# Patient Record
Sex: Male | Born: 1950
Health system: Southern US, Community
[De-identification: ages and names within clinical notes are randomized; demographics above are authoritative.]

## PROBLEM LIST (undated history)

## (undated) DIAGNOSIS — R51 Headache: Secondary | ICD-10-CM

## (undated) DIAGNOSIS — M1990 Unspecified osteoarthritis, unspecified site: Secondary | ICD-10-CM

## (undated) DIAGNOSIS — I2510 Atherosclerotic heart disease of native coronary artery without angina pectoris: Secondary | ICD-10-CM

## (undated) DIAGNOSIS — R519 Headache, unspecified: Secondary | ICD-10-CM

## (undated) DIAGNOSIS — C801 Malignant (primary) neoplasm, unspecified: Secondary | ICD-10-CM

## (undated) DIAGNOSIS — R42 Dizziness and giddiness: Secondary | ICD-10-CM

## (undated) DIAGNOSIS — I739 Peripheral vascular disease, unspecified: Secondary | ICD-10-CM

## (undated) DIAGNOSIS — R202 Paresthesia of skin: Secondary | ICD-10-CM

## (undated) DIAGNOSIS — H9319 Tinnitus, unspecified ear: Secondary | ICD-10-CM

## (undated) DIAGNOSIS — E785 Hyperlipidemia, unspecified: Secondary | ICD-10-CM

## (undated) DIAGNOSIS — H9190 Unspecified hearing loss, unspecified ear: Secondary | ICD-10-CM

## (undated) DIAGNOSIS — Z8719 Personal history of other diseases of the digestive system: Secondary | ICD-10-CM

## (undated) DIAGNOSIS — R7302 Impaired glucose tolerance (oral): Secondary | ICD-10-CM

## (undated) DIAGNOSIS — R200 Anesthesia of skin: Secondary | ICD-10-CM

## (undated) DIAGNOSIS — IMO0001 Reserved for inherently not codable concepts without codable children: Secondary | ICD-10-CM

## (undated) DIAGNOSIS — S46219A Strain of muscle, fascia and tendon of other parts of biceps, unspecified arm, initial encounter: Secondary | ICD-10-CM

## (undated) DIAGNOSIS — Z8709 Personal history of other diseases of the respiratory system: Secondary | ICD-10-CM

## (undated) HISTORY — PX: OTHER SURGICAL HISTORY: SHX169

## (undated) HISTORY — DX: Impaired glucose tolerance (oral): R73.02

## (undated) HISTORY — DX: Unspecified osteoarthritis, unspecified site: M19.90

## (undated) HISTORY — PX: HERNIA REPAIR: SHX51

## (undated) HISTORY — DX: Strain of muscle, fascia and tendon of other parts of biceps, unspecified arm, initial encounter: S46.219A

## (undated) HISTORY — DX: Atherosclerotic heart disease of native coronary artery without angina pectoris: I25.10

## (undated) HISTORY — DX: Peripheral vascular disease, unspecified: I73.9

## (undated) HISTORY — DX: Hyperlipidemia, unspecified: E78.5

## (undated) HISTORY — PX: PR VEIN BYPASS GRAFT,AORTO-FEM-POP: 35551

---

## 2000-03-31 ENCOUNTER — Encounter (INDEPENDENT_AMBULATORY_CARE_PROVIDER_SITE_OTHER): Payer: Self-pay | Admitting: *Deleted

## 2000-03-31 ENCOUNTER — Ambulatory Visit (HOSPITAL_BASED_OUTPATIENT_CLINIC_OR_DEPARTMENT_OTHER): Admission: RE | Admit: 2000-03-31 | Discharge: 2000-03-31 | Payer: Self-pay | Admitting: Plastic Surgery

## 2000-04-13 ENCOUNTER — Ambulatory Visit (HOSPITAL_BASED_OUTPATIENT_CLINIC_OR_DEPARTMENT_OTHER): Admission: RE | Admit: 2000-04-13 | Discharge: 2000-04-13 | Payer: Self-pay | Admitting: Plastic Surgery

## 2000-05-18 ENCOUNTER — Ambulatory Visit (HOSPITAL_BASED_OUTPATIENT_CLINIC_OR_DEPARTMENT_OTHER): Admission: RE | Admit: 2000-05-18 | Discharge: 2000-05-18 | Payer: Self-pay | Admitting: Plastic Surgery

## 2000-06-23 ENCOUNTER — Encounter (INDEPENDENT_AMBULATORY_CARE_PROVIDER_SITE_OTHER): Payer: Self-pay | Admitting: *Deleted

## 2000-06-23 ENCOUNTER — Ambulatory Visit (HOSPITAL_BASED_OUTPATIENT_CLINIC_OR_DEPARTMENT_OTHER): Admission: RE | Admit: 2000-06-23 | Discharge: 2000-06-23 | Payer: Self-pay | Admitting: Plastic Surgery

## 2002-09-15 ENCOUNTER — Encounter: Payer: Self-pay | Admitting: Emergency Medicine

## 2002-09-15 ENCOUNTER — Emergency Department (HOSPITAL_COMMUNITY): Admission: EM | Admit: 2002-09-15 | Discharge: 2002-09-15 | Payer: Self-pay | Admitting: Emergency Medicine

## 2002-10-03 ENCOUNTER — Ambulatory Visit (HOSPITAL_BASED_OUTPATIENT_CLINIC_OR_DEPARTMENT_OTHER): Admission: RE | Admit: 2002-10-03 | Discharge: 2002-10-03 | Payer: Self-pay | Admitting: Orthopedic Surgery

## 2003-11-30 DIAGNOSIS — I739 Peripheral vascular disease, unspecified: Secondary | ICD-10-CM

## 2003-11-30 HISTORY — PX: OTHER SURGICAL HISTORY: SHX169

## 2003-11-30 HISTORY — DX: Peripheral vascular disease, unspecified: I73.9

## 2004-08-20 ENCOUNTER — Ambulatory Visit (HOSPITAL_COMMUNITY): Admission: RE | Admit: 2004-08-20 | Discharge: 2004-08-20 | Payer: Self-pay | Admitting: Internal Medicine

## 2004-10-01 ENCOUNTER — Ambulatory Visit (HOSPITAL_COMMUNITY): Admission: RE | Admit: 2004-10-01 | Discharge: 2004-10-01 | Payer: Self-pay | Admitting: Vascular Surgery

## 2004-10-09 ENCOUNTER — Inpatient Hospital Stay (HOSPITAL_COMMUNITY): Admission: RE | Admit: 2004-10-09 | Discharge: 2004-10-12 | Payer: Self-pay | Admitting: Vascular Surgery

## 2005-01-06 ENCOUNTER — Encounter: Admission: RE | Admit: 2005-01-06 | Discharge: 2005-01-06 | Payer: Self-pay | Admitting: Internal Medicine

## 2008-04-25 ENCOUNTER — Ambulatory Visit: Payer: Self-pay | Admitting: Vascular Surgery

## 2008-05-29 ENCOUNTER — Ambulatory Visit: Payer: Self-pay | Admitting: Vascular Surgery

## 2008-07-05 ENCOUNTER — Encounter: Admission: RE | Admit: 2008-07-05 | Discharge: 2008-07-05 | Payer: Self-pay | Admitting: Internal Medicine

## 2008-08-26 ENCOUNTER — Ambulatory Visit: Payer: Self-pay | Admitting: Vascular Surgery

## 2008-09-06 ENCOUNTER — Ambulatory Visit: Payer: Self-pay | Admitting: Internal Medicine

## 2008-09-06 DIAGNOSIS — I739 Peripheral vascular disease, unspecified: Secondary | ICD-10-CM

## 2008-09-06 DIAGNOSIS — I70219 Atherosclerosis of native arteries of extremities with intermittent claudication, unspecified extremity: Secondary | ICD-10-CM | POA: Insufficient documentation

## 2010-08-21 ENCOUNTER — Ambulatory Visit: Payer: Self-pay | Admitting: Internal Medicine

## 2010-08-21 LAB — CONVERTED CEMR LAB
ALT: 15 units/L (ref 0–53)
AST: 20 units/L (ref 0–37)
Albumin: 4.1 g/dL (ref 3.5–5.2)
Alkaline Phosphatase: 61 units/L (ref 39–117)
BUN: 18 mg/dL (ref 6–23)
Basophils Absolute: 0 10*3/uL (ref 0.0–0.1)
Basophils Relative: 1 % (ref 0.0–3.0)
Bilirubin Urine: NEGATIVE
Bilirubin, Direct: 0.1 mg/dL (ref 0.0–0.3)
CO2: 29 meq/L (ref 19–32)
Calcium: 8.7 mg/dL (ref 8.4–10.5)
Chloride: 101 meq/L (ref 96–112)
Cholesterol: 171 mg/dL (ref 0–200)
Creatinine, Ser: 0.9 mg/dL (ref 0.4–1.5)
Eosinophils Absolute: 0.1 10*3/uL (ref 0.0–0.7)
Eosinophils Relative: 1.5 % (ref 0.0–5.0)
GFR calc non Af Amer: 86.66 mL/min (ref >60)
Glucose, Bld: 83 mg/dL (ref 70–99)
HCT: 45.5 % (ref 39.0–52.0)
HDL: 46.1 mg/dL (ref >39.00)
Hemoglobin, Urine: NEGATIVE
Hemoglobin: 15.6 g/dL (ref 13.0–17.0)
Ketones, ur: NEGATIVE mg/dL
LDL Cholesterol: 118 mg/dL — ABNORMAL HIGH (ref 0–99)
Leukocytes, UA: NEGATIVE
Lymphocytes Relative: 32.6 % (ref 12.0–46.0)
Lymphs Abs: 1.7 10*3/uL (ref 0.7–4.0)
MCHC: 34.4 g/dL (ref 30.0–36.0)
MCV: 95.6 fL (ref 78.0–100.0)
Monocytes Absolute: 0.5 10*3/uL (ref 0.1–1.0)
Monocytes Relative: 9.5 % (ref 3.0–12.0)
Neutro Abs: 2.8 10*3/uL (ref 1.4–7.7)
Neutrophils Relative %: 55.4 % (ref 43.0–77.0)
Nitrite: NEGATIVE
PSA: 0.57 ng/mL (ref 0.10–4.00)
Platelets: 234 10*3/uL (ref 150.0–400.0)
Potassium: 4.3 meq/L (ref 3.5–5.1)
RBC: 4.76 M/uL (ref 4.22–5.81)
RDW: 13.7 % (ref 11.5–14.6)
Sodium: 138 meq/L (ref 135–145)
Specific Gravity, Urine: 1.02 (ref 1.000–1.030)
TSH: 2.18 microintl units/mL (ref 0.35–5.50)
Total Bilirubin: 0.8 mg/dL (ref 0.3–1.2)
Total CHOL/HDL Ratio: 4
Total Protein, Urine: NEGATIVE mg/dL
Total Protein: 6.6 g/dL (ref 6.0–8.3)
Triglycerides: 33 mg/dL (ref 0.0–149.0)
Urine Glucose: NEGATIVE mg/dL
Urobilinogen, UA: 0.2 (ref 0.0–1.0)
VLDL: 6.6 mg/dL (ref 0.0–40.0)
WBC: 5.1 10*3/uL (ref 4.5–10.5)
pH: 6.5 (ref 5.0–8.0)

## 2010-08-27 ENCOUNTER — Encounter: Payer: Self-pay | Admitting: Internal Medicine

## 2010-08-27 ENCOUNTER — Ambulatory Visit: Payer: Self-pay | Admitting: Internal Medicine

## 2010-08-27 DIAGNOSIS — E785 Hyperlipidemia, unspecified: Secondary | ICD-10-CM | POA: Insufficient documentation

## 2010-08-27 DIAGNOSIS — M19049 Primary osteoarthritis, unspecified hand: Secondary | ICD-10-CM | POA: Insufficient documentation

## 2010-08-27 DIAGNOSIS — R079 Chest pain, unspecified: Secondary | ICD-10-CM | POA: Insufficient documentation

## 2010-08-31 ENCOUNTER — Encounter (INDEPENDENT_AMBULATORY_CARE_PROVIDER_SITE_OTHER): Payer: Self-pay | Admitting: *Deleted

## 2010-09-01 ENCOUNTER — Encounter (INDEPENDENT_AMBULATORY_CARE_PROVIDER_SITE_OTHER): Payer: Self-pay | Admitting: *Deleted

## 2010-09-02 ENCOUNTER — Telehealth (INDEPENDENT_AMBULATORY_CARE_PROVIDER_SITE_OTHER): Payer: Self-pay

## 2010-09-03 ENCOUNTER — Ambulatory Visit: Payer: Self-pay

## 2010-09-03 ENCOUNTER — Encounter (HOSPITAL_COMMUNITY)
Admission: RE | Admit: 2010-09-03 | Discharge: 2010-09-14 | Payer: Self-pay | Source: Home / Self Care | Admitting: Internal Medicine

## 2010-09-03 ENCOUNTER — Ambulatory Visit: Payer: Self-pay | Admitting: Gastroenterology

## 2010-09-03 ENCOUNTER — Encounter: Payer: Self-pay | Admitting: Internal Medicine

## 2010-09-03 ENCOUNTER — Ambulatory Visit: Payer: Self-pay | Admitting: Internal Medicine

## 2010-09-04 ENCOUNTER — Encounter: Payer: Self-pay | Admitting: Internal Medicine

## 2010-09-04 DIAGNOSIS — R0609 Other forms of dyspnea: Secondary | ICD-10-CM | POA: Insufficient documentation

## 2010-09-04 DIAGNOSIS — R0989 Other specified symptoms and signs involving the circulatory and respiratory systems: Secondary | ICD-10-CM

## 2010-09-04 DIAGNOSIS — R9431 Abnormal electrocardiogram [ECG] [EKG]: Secondary | ICD-10-CM | POA: Insufficient documentation

## 2010-09-07 ENCOUNTER — Ambulatory Visit: Payer: Self-pay | Admitting: Gastroenterology

## 2010-09-07 LAB — HM COLONOSCOPY

## 2010-09-09 ENCOUNTER — Encounter: Payer: Self-pay | Admitting: Gastroenterology

## 2010-09-15 ENCOUNTER — Ambulatory Visit: Payer: Self-pay | Admitting: Cardiology

## 2010-09-16 ENCOUNTER — Encounter: Payer: Self-pay | Admitting: Cardiology

## 2010-09-17 ENCOUNTER — Telehealth: Payer: Self-pay | Admitting: Internal Medicine

## 2010-09-17 ENCOUNTER — Telehealth: Payer: Self-pay | Admitting: Cardiology

## 2010-09-23 ENCOUNTER — Telehealth: Payer: Self-pay | Admitting: Cardiology

## 2010-09-25 ENCOUNTER — Ambulatory Visit: Payer: Self-pay | Admitting: Cardiology

## 2010-09-25 ENCOUNTER — Inpatient Hospital Stay (HOSPITAL_BASED_OUTPATIENT_CLINIC_OR_DEPARTMENT_OTHER): Admission: RE | Admit: 2010-09-25 | Discharge: 2010-09-25 | Payer: Self-pay | Admitting: Cardiology

## 2010-10-01 ENCOUNTER — Ambulatory Visit: Payer: Self-pay | Admitting: Cardiology

## 2010-10-01 DIAGNOSIS — I2510 Atherosclerotic heart disease of native coronary artery without angina pectoris: Secondary | ICD-10-CM | POA: Insufficient documentation

## 2010-10-12 ENCOUNTER — Ambulatory Visit: Payer: Self-pay | Admitting: Cardiology

## 2010-10-13 ENCOUNTER — Ambulatory Visit: Payer: Self-pay | Admitting: Vascular Surgery

## 2010-10-13 LAB — CONVERTED CEMR LAB
BUN: 16 mg/dL (ref 6–23)
CO2: 30 meq/L (ref 19–32)
Calcium: 8.9 mg/dL (ref 8.4–10.5)
Chloride: 101 meq/L (ref 96–112)
Creatinine, Ser: 1 mg/dL (ref 0.4–1.5)
GFR calc non Af Amer: 78.83 mL/min (ref >60)
Glucose, Bld: 169 mg/dL — ABNORMAL HIGH (ref 70–99)
Potassium: 4.4 meq/L (ref 3.5–5.1)
Sodium: 137 meq/L (ref 135–145)

## 2010-12-07 ENCOUNTER — Ambulatory Visit: Admit: 2010-12-07 | Payer: Self-pay | Admitting: Cardiology

## 2010-12-27 LAB — CONVERTED CEMR LAB
ALT: 21 units/L (ref 0–53)
AST: 28 units/L (ref 0–37)
Albumin: 4 g/dL (ref 3.5–5.2)
Alkaline Phosphatase: 75 units/L (ref 39–117)
BUN: 17 mg/dL (ref 6–23)
BUN: 18 mg/dL (ref 6–23)
Basophils Absolute: 0.1 10*3/uL (ref 0.0–0.1)
Basophils Absolute: 0.1 10*3/uL (ref 0.0–0.1)
Basophils Relative: 0.9 % (ref 0.0–3.0)
Basophils Relative: 1 % (ref 0.0–3.0)
Bilirubin Urine: NEGATIVE
Bilirubin, Direct: 0.2 mg/dL (ref 0.0–0.3)
CO2: 31 meq/L (ref 19–32)
CO2: 32 meq/L (ref 19–32)
Calcium: 8.9 mg/dL (ref 8.4–10.5)
Calcium: 9 mg/dL (ref 8.4–10.5)
Chloride: 103 meq/L (ref 96–112)
Chloride: 107 meq/L (ref 96–112)
Cholesterol: 162 mg/dL (ref 0–200)
Creatinine, Ser: 0.8 mg/dL (ref 0.4–1.5)
Creatinine, Ser: 1.1 mg/dL (ref 0.4–1.5)
Eosinophils Absolute: 0.1 10*3/uL (ref 0.0–0.7)
Eosinophils Absolute: 0.1 10*3/uL (ref 0.0–0.7)
Eosinophils Relative: 0.8 % (ref 0.0–5.0)
Eosinophils Relative: 1.2 % (ref 0.0–5.0)
GFR calc Af Amer: 88 mL/min
GFR calc non Af Amer: 100.02 mL/min (ref >60)
GFR calc non Af Amer: 73 mL/min
Glucose, Bld: 85 mg/dL (ref 70–99)
Glucose, Bld: 95 mg/dL (ref 70–99)
HCT: 41.5 % (ref 39.0–52.0)
HCT: 43.7 % (ref 39.0–52.0)
HDL: 41.7 mg/dL (ref >39.0)
Hemoglobin, Urine: NEGATIVE
Hemoglobin: 14.3 g/dL (ref 13.0–17.0)
Hemoglobin: 14.9 g/dL (ref 13.0–17.0)
INR: 0.9 (ref 0.8–1.0)
Ketones, ur: NEGATIVE mg/dL
LDL Cholesterol: 110 mg/dL — ABNORMAL HIGH (ref 0–99)
Leukocytes, UA: NEGATIVE
Lymphocytes Relative: 26.7 % (ref 12.0–46.0)
Lymphocytes Relative: 31 % (ref 12.0–46.0)
Lymphs Abs: 1.6 10*3/uL (ref 0.7–4.0)
MCHC: 34.2 g/dL (ref 30.0–36.0)
MCHC: 34.4 g/dL (ref 30.0–36.0)
MCV: 95.1 fL (ref 78.0–100.0)
MCV: 95.5 fL (ref 78.0–100.0)
Monocytes Absolute: 0.5 10*3/uL (ref 0.1–1.0)
Monocytes Absolute: 0.8 10*3/uL (ref 0.1–1.0)
Monocytes Relative: 9.7 % (ref 3.0–12.0)
Monocytes Relative: 9.9 % (ref 3.0–12.0)
Neutro Abs: 3 10*3/uL (ref 1.4–7.7)
Neutro Abs: 4.9 10*3/uL (ref 1.4–7.7)
Neutrophils Relative %: 57.1 % (ref 43.0–77.0)
Neutrophils Relative %: 61.7 % (ref 43.0–77.0)
Nitrite: NEGATIVE
Platelets: 230 10*3/uL (ref 150–400)
Platelets: 248 10*3/uL (ref 150.0–400.0)
Potassium: 4.4 meq/L (ref 3.5–5.1)
Potassium: 4.9 meq/L (ref 3.5–5.1)
Prothrombin Time: 9.9 s (ref 9.7–11.8)
RBC: 4.37 M/uL (ref 4.22–5.81)
RBC: 4.57 M/uL (ref 4.22–5.81)
RDW: 13.7 % (ref 11.5–14.6)
RDW: 13.8 % (ref 11.5–14.6)
Sodium: 140 meq/L (ref 135–145)
Sodium: 142 meq/L (ref 135–145)
Specific Gravity, Urine: 1.025 (ref 1.000–1.03)
TSH: 2.24 microintl units/mL (ref 0.35–5.50)
Total Bilirubin: 1 mg/dL (ref 0.3–1.2)
Total CHOL/HDL Ratio: 3.9
Total Protein, Urine: NEGATIVE mg/dL
Total Protein: 6.8 g/dL (ref 6.0–8.3)
Triglycerides: 53 mg/dL (ref 0–149)
Urine Glucose: NEGATIVE mg/dL
Urobilinogen, UA: 0.2 (ref 0.0–1.0)
VLDL: 11 mg/dL (ref 0–40)
WBC: 5.3 10*3/uL (ref 4.5–10.5)
WBC: 8 10*3/uL (ref 4.5–10.5)
aPTT: 31.2 s — ABNORMAL HIGH (ref 21.7–28.8)
pH: 6 (ref 5.0–8.0)

## 2010-12-29 NOTE — Progress Notes (Signed)
Summary: WIFE CALLING RE PROCEDURE FRIDAY  Phone Note Call from Patient   Caller: Spouse [name] [phone] Reason for Call: Talk to Nurse Summary of Call: RE PROCEDURE THIS FRIDAY-WANTS A CALL TODAY  Initial call taken by: Glynda Jaeger,  September 23, 2010 3:42 PM  Follow-up for Phone Call        g[facility]

## 2010-12-29 NOTE — Letter (Signed)
Summary: Pre Visit Letter Revised  [facility]  [address] [place], [place] [address]   Phone: [phone]  Fax: [phone]        08/31/2010 MRN: [id number] Willie Kennedy [address] [place], [place]  [address]             Procedure Date:  09/07/2010  Welcome to the Gastroenterology Division at [facility].    You are scheduled to see a nurse for your pre-procedure visit on 09/02/2010 at 8:00am on the 3rd floor at [facility], [address]  We ask that you try to arrive at our office 15 minutes prior to your appointment time to allow for check-in.  Please take a minute to review the attached form.  If you answer "Yes" to one or more of the questions on the first page, we ask that you call the person listed at your earliest opportunity.  If you answer "No" to all of the questions, please complete the rest of the form and bring it to your appointment.    Your nurse visit will consist of discussing your medical and surgical history, your immediate family medical history, and your medications.   If you are unable to list all of your medications on the form, please bring the medication bottles to your appointment and we will list them.  We will need to be aware of both prescribed and over the counter drugs.  We will need to know exact dosage information as well.    Please be prepared to read and sign documents such as consent forms, a financial agreement, and acknowledgement forms.  If necessary, and with your consent, a friend or relative is welcome to sit-in on the nurse visit with you.  Please bring your insurance card so that we may make a copy of it.  If your insurance requires a referral to see a specialist, please bring your referral form from your primary care physician.  No co-pay is required for this nurse visit.     If you cannot keep your appointment, please call [phone] to cancel or reschedule prior to your appointment date.  This allows  [place] the opportunity to schedule an appointment for another patient in need of care.    Thank you for choosing [facility] for your medical needs.  We appreciate the opportunity to care for you.  Please visit [place] at our website  to learn more about our practice.  Sincerely, The Gastroenterology Division

## 2010-12-29 NOTE — Assessment & Plan Note (Signed)
Summary: np6/abn stress test   Visit Type:  Initial Consult Primary Provider:  Corwin Levins MD   History of Present Illness: 60 yo with history of smoking and PAD (s/p left fem-pop in 2005) presents in followup of abnormal myoview.  Patient lost his insurance and had not seen an MD for several years.  He recently saw Dr. Jonny Ruiz and reported some increased exertional dyspnea recently, so he had an ETT-myoview showing a reversible inferior perfusion defect and ST depression on the exercise ECG.  Patient gets short of breath walking up an incline or up a flight of steps.  This has worsened over the last few months.  He was a heavy smoker in the past but quit in 2001.  He gets sharp lower chest pain that can last from 10 seconds to a minute.  This is not exertional and comes on seemingly at random a couple of times a week.  No orthopnea/PND/syncope.  Ever since his left fem-pop, patient has had soreness from his knee down on the left when he walks up steps or up a hill.  He is going to see Dr. Hart [facility] at [facility] next month for followup of PAD.    ECG: NSR, anterolateral T wave flattening and inferior T wave inversion  Labs (9/11): K 4.3, creatinine 0.9, TSH normal, LDL 118, HDL 46  Current Medications (verified): 1)  Adult Aspirin Ec Low Strength 81 Mg Tbec (Aspirin) .Marland Kitchen.. 1 By Mouth Once Daily  Allergies (verified): No Known Drug Allergies  Past History:  Past Medical History: 1. Peripheral vascular disease: s/p left fem-pop in 2005 Hart Rochester) 2. Hyperlipidemia - diet 3. H/o right biceps tendon rupture 4. OA 5. CAD: ETT-myoview (10/11) with EF 52%, reversible inferior perfusion defect, 1 mm inferolateral ST depression with exercise, 7'16" and stopped due to dyspnea and leg weakness.   Family History: Father: lung cancer Mother: cervical cancer No premature CAD.   Social History: Married no biological children work - IT sales professional Former Smoker, quit 2001 Alcohol use-no  Review of  Systems       All systems reviewed and negative except as per HPI.   Vital Signs:  Patient profile:   60 year old male Height:      66 inches Weight:      127 pounds BMI:     20.57 Pulse rate:   64 / minute BP sitting:   100 / 60  (left arm)  Vitals Entered By: Laurance Flatten CMA (September 15, 2010 8:30 AM)  Physical Exam  General:  Well developed, well nourished, in no acute distress. Head:  normocephalic and atraumatic Nose:  no deformity, discharge, inflammation, or lesions Mouth:  Teeth, gums and palate normal. Oral mucosa normal. Neck:  Neck supple, no JVD. No masses, thyromegaly or abnormal cervical nodes. Lungs:  Distant breath sounds.  Heart:  Non-displaced PMI, chest non-tender; distant heart sounds, regular rate and rhythm, S1, S2 without murmurs, rubs or gallops. Carotid upstroke normal, no bruit. 2+ PT pulses bilaterally. No edema, no varicosities. Abdomen:  Bowel sounds positive; abdomen soft and non-tender without masses, organomegaly, or hernias noted. No hepatosplenomegaly. Msk:  Back normal, normal gait. Muscle strength and tone normal. Extremities:  No clubbing or cyanosis. Neurologic:  Alert and oriented x 3. Skin:  Intact without lesions or rashes. Psych:  Normal affect.   Impression & Recommendations:  Problem # 1:  DYSPNEA ON EXERTION (ICD-786.09) Patient has had increasing exertional dyspnea.  He has had atypical, but no exertional,  chest pain.  He has a history of PAD, so risk of coronary disease is high.  ETT-myoview was suggestive of ischemia by both ECG and nuclear perfusion (inferior reversible defect).  I will plan on doing a left heart cath, radial access, to define coronary disease and treat.  We discussed the 11/998 risk of serious complications.  He will continue ASA 81 mg daily and I will have him start simvastatin 40 mg daily with goal LDL < 70.  I will give him a prescription for sublingual NTG.   Problem # 2:  HYPERLIPIDEMIA (ICD-272.4)  LDL  118.  Starting simvastatin with goal LDL < 70, repeat lipids/LFTs in 1 month.   His updated medication list for this problem includes:    Zocor 40 Mg Tabs (Simvastatin) .Marland Kitchen... Take 1 tablet every evening  Problem # 3:  PERIPHERAL VASCULAR DISEASE (ICD-443.9) History of left fem-pop with pain in his calves with exertion.  His PT pulses actually feel fairly strong.  He is going to be following with Dr. Hart Rochester.   Other Orders: EKG w/ Interpretation (93000) TLB-BMP (Basic Metabolic Panel-BMET) (80048-METABOL) TLB-CBC Platelet - w/Differential (85025-CBCD) TLB-PTT (85730-PTTL) TLB-PT (Protime) (85610-PTP) Prescriptions: ZOCOR 40 MG TABS (SIMVASTATIN) take 1 tablet every evening  #30 x 10   Entered by:   Ledon Snare, RN   Authorized by:   Marca Ancona, MD   Signed by:   Ledon Snare, RN on 09/15/2010   Method used:   Electronically to        [facility] (retail)       [address]       [place], [place]  [address]       Ph: [phone]       Fax: [phone]   RxID:   [id number] NITROSTAT 0.4 MG SUBL (NITROGLYCERIN) place 1 tab under tongue for chest pain--not to exceed 3 in 15 minutes  #25 x 1   Entered by:   Ledon Snare, RN   Authorized by:   Marca Ancona, MD   Signed by:   Ledon Snare, RN on 09/15/2010   Method used:   Electronically to        [facility] (retail)       [address]       [place], [place]  [address]       Ph: [phone]       Fax: [phone]   RxID:   [id number]

## 2010-12-29 NOTE — Procedures (Signed)
Summary: Colonoscopy  Patient: Willie Kennedy Note: All result statuses are Final unless otherwise noted.  Tests: (1) Colonoscopy (COL)   COL Colonoscopy           DONE     [facility]     [address]     [place], [place]  [address]           COLONOSCOPY PROCEDURE REPORT           PATIENT:  Willie Kennedy, Willie Kennedy  MR#:  [id number]     BIRTHDATE:  October 07, 1951, 59 yrs. old  GENDER:  male     ENDOSCOPIST:  Rachael Fee, MD     REF. BY:  Oliver Barre, M.D.     PROCEDURE DATE:  09/07/2010     PROCEDURE:  Colonoscopy with snare polypectomy     ASA CLASS:  Class II     INDICATIONS:  Routine Risk Screening     MEDICATIONS:   Fentanyl 50 mcg IV, Versed 7 mg IV           DESCRIPTION OF PROCEDURE:   After the risks benefits and     alternatives of the procedure were thoroughly explained, informed     consent was obtained.  Digital rectal exam was performed and     revealed no rectal masses.   The LB [id number] endoscope     was introduced through the anus and advanced to the cecum, which     was identified by both the appendix and ileocecal valve, without     limitations.  The quality of the prep was excellent, using     MoviPrep.  The instrument was then slowly withdrawn as the colon     was fully examined.           <<PROCEDUREIMAGES>>           FINDINGS:  A diminutive polyp was found in the sigmoid colon. This     was 2-1mm across, removed with cold snare and sent to pathology     (jar 1) (see image4).  This was otherwise a normal examination of     the colon (see image5, image3, and image2).   Retroflexed views in     the rectum revealed no abnormalities.    The scope was then     withdrawn from the patient and the procedure completed.           COMPLICATIONS:  None     ENDOSCOPIC IMPRESSION:     1) Diminutive polyp in the sigmoid colon, removed and sent to     pathology     2) Otherwise normal examination           RECOMMENDATIONS:     1) If the polyp(s) removed  today are proven to be adenomatous     (pre-cancerous) polyps, you will need a repeat colonoscopy in 5     years. Otherwise you should continue to follow colorectal cancer     screening guidelines for "routine risk" patients with colonoscopy     in 10 years.     2) You will receive a letter within 1-2 weeks with the results     of your biopsy as well as final recommendations. Please call my     office if you have not received a letter after 3 weeks.           ______________________________     Rachael Fee, MD  n.     eSIGNED:   Rachael Fee at 09/07/2010 11:45 AM           Annitta Needs, [id number]  Note: An exclamation mark (!) indicates a result that was not dispersed into the flowsheet. Document Creation Date: 09/07/2010 11:46 AM _______________________________________________________________________  (1) Order result status: Final Collection or observation date-time: 09/07/2010 11:41 Requested date-time:  Receipt date-time:  Reported date-time:  Referring Physician:   Ordering Physician: Rob Bunting [phone]) Specimen Source:  Source: Launa Grill Order Number: [id number] Lab site:   Appended Document: Colonoscopy     Procedures Next Due Date:    Colonoscopy: 08/2020

## 2010-12-29 NOTE — Assessment & Plan Note (Signed)
Summary: 2wk f/u sl   Primary Provider:  Corwin Levins MD   History of Present Illness: 60 yo with history of smoking and PAD (s/p left fem-pop in 2005) presents in followup after left heart cath.  He recently saw Dr. Jonny Ruiz and reported some increased exertional dyspnea recently, so he had an ETT-myoview showing a reversible inferior perfusion defect and ST depression on the exercise ECG.  Patient gets short of breath walking up an incline or up a flight of steps.  This worsened over the last few months.  He was a heavy smoker in the past but quit in 2001.  He gets sharp lower chest pain that can last from 10 seconds to a minute.  This is not exertional and comes on seemingly at random a couple of times a week.  No orthopnea/PND/syncope.  Ever since his left fem-pop, patient has had soreness from his knee down on the left when he walks up steps or up a hill.  He is going to see Dr. Hart Rochester at [facility] this month for followup of PAD.    Left heart cath showed normal EF and mild nonobstructive CAD (40% lesions in the RCA and CFX).  Since that time, he has had 1 episode of chest pressure that occurred while mowing the grass.  This lasted 35-40 seconds.  He has been under a lot of stress lately over money and has been getting little sleep.    Labs (9/11): K 4.3, creatinine 0.9, TSH normal, LDL 118, HDL 46  Allergies: No Known Drug Allergies  Past History:  Past Medical History: 1. Peripheral vascular disease: s/p left fem-pop in 2005 Hart [facility]) 2. Hyperlipidemia  3. H/o right biceps tendon rupture 4. OA 5. CAD: ETT-myoview (10/11) with EF 52%, reversible inferior perfusion defect, 1 mm inferolateral ST depression with exercise, 7'16" and stopped due to dyspnea and leg weakness.  LHC (10/11): EF 55%, 40% pRCA, 40% mRCA, 40% prox to mid CFX.  Medical treatment.  Family History: Reviewed history from 09/15/2010 and no changes required. Father: lung cancer Mother: cervical cancer No premature CAD.    Social History: Reviewed history from 09/15/2010 and no changes required. Married no biological children work - IT sales professional Former Smoker, quit 2001 Alcohol use-no  Review of Systems       All systems reviewed and negative except as per HPI.   Vital Signs:  Patient profile:   60 year old male Height:      128 inches (325.12 cm) Weight:      65 pounds (29.55 kg) Pulse rate:   78 / minute Pulse rhythm:   regular BP sitting:   107 / 72  (right arm) Cuff size:   regular  Vitals Entered By: Charolotte Capuchin, RN (October 01, 2010 8:31 AM)  Physical Exam  General:  Well developed, well nourished, in no acute distress. Neck:  Neck supple, no JVD. No masses, thyromegaly or abnormal cervical nodes. Lungs:  Distant breath sounds.  Heart:  Non-displaced PMI, chest non-tender; distant heart sounds, regular rate and rhythm, S1, S2 without murmurs, rubs or gallops. Carotid upstroke normal, no bruit. 2+ PT pulses bilaterally. No edema, no varicosities. Abdomen:  Bowel sounds positive; abdomen soft and non-tender without masses, organomegaly, or hernias noted. No hepatosplenomegaly. Extremities:  No clubbing or cyanosis. Neurologic:  Alert and oriented x 3.   Impression & Recommendations:  Problem # 1:  CAD, NATIVE VESSEL (ICD-414.01) Patient has nonobstructive coronary disease.  He needs aggressive medical management  of risk factors.  I have started him on simvastatin with goal LDL < 70.  He is also taking ASA 162 mg daily.  I am going to begin him on a low dose of lisinopril to ameliorate cardiac risk (2.5 mg daily).  I am not going to start a beta blocker as he is bradycardic in the 50s at times.  Will check BMET in 2 wks on lisinopril.   Problem # 2:  HYPERLIPIDEMIA (ICD-272.4) Goal LDL < 70, lipids/LFTs in 1/11.   Problem # 3:  PERIPHERAL VASCULAR DISEASE (ICD-443.9) History of left fem-pop with pain in his calves with exertion.  His PT pulses actually feel fairly  strong.  He is going to be following with Dr. Hart Rochester.   Patient Instructions: 1)  Your physician recommends that you schedule a follow-up appointment in: 6 months with Dr Shirlee Latch 2)  Your physician recommends that you return for a FASTING lipid and liver profile: in 11/2010 3)  Your physician recommends that you return for lab work in:  2 weeks for BMP 414.01  v58.69 4)  Your physician has recommended you make the following change in your medication: Start Lisinopril 2.5 mg once a day  Prescriptions: LISINOPRIL 2.5 MG TABS (LISINOPRIL) one daily  #90 x 3   Entered by:   Charolotte Capuchin, RN   Authorized by:   Marca Ancona, MD   Signed by:   Charolotte Capuchin, RN on 10/01/2010   Method used:   Electronically to        [facility] (retail)       [address]       [place], [place]  [address]       Ph: [phone]       Fax: [phone]   RxID:   [id number]

## 2010-12-29 NOTE — Miscellaneous (Signed)
Summary: [facility] previsit  Clinical Lists Changes  Medications: Added new medication of MOVIPREP 100 GM  SOLR (PEG-KCL-NACL-NASULF-NA ASC-C) As per prep instructions. - Signed Rx of MOVIPREP 100 GM  SOLR (PEG-KCL-NACL-NASULF-NA ASC-C) As per prep instructions.;  #1 x 0;  Signed;  Entered by: Karl Bales RN;  Authorized by: Rachael Fee MD;  Method used: Electronically to [facility] [address] [place], [place], [place]  [address], Ph: [phone], Fax: [phone]    Prescriptions: MOVIPREP 100 GM  SOLR (PEG-KCL-NACL-NASULF-NA ASC-C) As per prep instructions.  #1 x 0   Entered by:   Karl Bales RN   Authorized by:   Rachael Fee MD   Signed by:   Karl Bales RN on 09/03/2010   Method used:   Electronically to        [facility] (retail)       [address]       [place], [place]  [address]       Ph: [phone]       Fax: [phone]   RxID:   [id number]

## 2010-12-29 NOTE — Assessment & Plan Note (Signed)
Summary: CPX/ LOV 2009 /NWS  #   Vital Signs:  Patient profile:   60 year old male Height:      66 inches Weight:      131.50 pounds BMI:     21.30 O2 Sat:      94 % on Room air Temp:     97.8 degrees F oral Pulse rate:   62 / minute BP sitting:   110 / 62  (left arm) Cuff size:   regular  Vitals Entered By: Zella Ball Ewing CMA Duncan Dull) (August 27, 2010 10:28 AM)  O2 Flow:  Room air  CC: ADult Physical/RE, Abdominal Pain   CC:  ADult Physical/RE and Abdominal Pain.  History of Present Illness: here for wellness and f/u;  has known PVD s/p LLE vascular bypass surgury, but has not had f/u in several yrs as the bill is approx $500 and he has not had insurance for several yrs;  Has overall stable slight occasional discomfort to the left calf that is not always exertional, but has had onset RLE discomfort to the right calf with exertion that he thinks fairly reproducible though not significantly activitiy limiting at this point, but is worried about progressive vascular disease now on the right.  Also incidently has had left intermittent CP for 1-2 months, mild, dull , sometimes with exertion and sometimes not, but not assoc with sob, n/v, diaphoresis, palp or syncope or radiation.  Pt denies other CP, worsening sob, doe, wheezing, orthopnea, pnd, worsening LE edema, palps, dizziness.  Quit smoking approx 8 yrs ago.  Trying to follow lower chol diet, but not always successful.  Does still excercise a few times per wk with lifting wts at the gym. Due for flu shot.   Problems Prior to Update: 1)  Osteoarthritis, Hands, Bilateral  (ICD-715.94) 2)  Chest Pain  (ICD-786.50) 3)  Hyperlipidemia  (ICD-272.4) 4)  Preventive Health Care  (ICD-V70.0) 5)  Peripheral Vascular Disease  (ICD-443.9)  Medications Prior to Update: 1)  Nos Pump 2)  Glutamine 3)  Chromium Picolinate 4)  Dhea 5)  Adult Aspirin Ec Low Strength 81 Mg Tbec (Aspirin) .Marland Kitchen.. 1 By Mouth Once Daily  Current Medications  (verified): 1)  Nos Pump 2)  Glutamine 3)  Chromium Picolinate 4)  Dhea 5)  Adult Aspirin Ec Low Strength 81 Mg Tbec (Aspirin) .Marland Kitchen.. 1 By Mouth Once Daily 6)  Celebrex 200 Mg Caps (Celecoxib) .Marland Kitchen.. 1po Two Times A Day As Needed Pain  Allergies (verified): No Known Drug Allergies  Past History:  Family History: Last updated: 09/06/2008 father with ETOH, smoker, lung cancer, stroke mother with ovary cancer  Social History: Last updated: 09/06/2008 Married no biological children work - IT sales professional Former Smoker Alcohol use-no  Risk Factors: Smoking Status: quit (09/06/2008)  Past Medical History: Peripheral vascular disease former smoker Hyperlipidemia - diet  Past Surgical History: ds/p left femoral artery occlusion 2005 s/p left fem-pop bypass - Dr Hart Rochester s/p right bicep tendon rupture repair  Review of Systems  The patient denies anorexia, fever, weight loss, vision loss, decreased hearing, hoarseness, chest pain, syncope, dyspnea on exertion, peripheral edema, prolonged cough, headaches, hemoptysis, abdominal pain, melena, hematochezia, severe indigestion/heartburn, hematuria, muscle weakness, suspicious skin lesions, transient blindness, difficulty walking, depression, unusual weight change, abnormal bleeding, enlarged lymph nodes, and angioedema.         all otherwise negative per pt -  except for bilat finger joint pain adn stiffness in the am after working all day the day  prior  Physical Exam  General:  alert and well-developed., muscular, athletic appearing Head:  Normocephalic and atraumatic without obvious abnormalities. No apparent alopecia or balding. Eyes:  No corneal or conjunctival inflammation noted. EOMI. Perrla. Ears:  External ear exam shows no significant lesions or deformities.  Otoscopic examination reveals clear canals, tympanic membranes are intact bilaterally without bulging, retraction, inflammation or discharge. Hearing is grossly  normal bilaterally. Nose:  External nasal examination shows no deformity or inflammation. Nasal mucosa are pink and moist without lesions or exudates. Mouth:  Oral mucosa and oropharynx without lesions or exudates.  Teeth in good repair. Neck:  No deformities, masses, or tenderness noted. Lungs:  Normal respiratory effort, chest expands symmetrically. Lungs are clear to auscultation, no crackles or wheezes. Heart:  Normal rate and regular rhythm. S1 and S2 normal without gallop, murmur, click, rub or other extra sounds. Abdomen:  Bowel sounds positive,abdomen soft and non-tender without masses, organomegaly or hernias noted. Msk:  No deformity or scoliosis noted of thoracic or lumbar spine.   Extremities:  No clubbing, cyanosis, edema, or deformity noted with normal full range of motion of all joints.   Neurologic:  alert & oriented X3, cranial nerves II-XII intact, and strength normal in all extremities.   Skin:  color normal and no rashes.   Psych:  slightly anxious.     Impression & Recommendations:  Problem # 1:  Preventive Health Care (ICD-V70.0)  Overall doing well, age appropriate education and counseling updated and referral for appropriate preventive services done unless declined, immunizations up to date or declined, diet counseling done if overweight, urged to quit smoking if smokes , most recent labs reviewed and current ordered if appropriate, ecg reviewed or declined (interpretation per ECG scanned in the EMR if done); information regarding Medicare Prevention requirements given if appropriate; speciality referrals updated as appropriate   Orders: EKG w/ Interpretation ([address]) Gastroenterology Referral (GI)  Problem # 2:  HYPERLIPIDEMIA (ICD-272.4)  mild, goal ldl < 70 and has been able to do this with diet in the past, declines statin for now, to re-focus on diet  Labs Reviewed: SGOT: 20 (08/21/2010)   SGPT: 15 (08/21/2010)   HDL:46.10 (08/21/2010), 41.7 (09/06/2008)   LDL:118 (08/21/2010), 110 (09/06/2008)  Chol:171 (08/21/2010), 162 (09/06/2008)  Trig:33.0 (08/21/2010), 53 (09/06/2008)  Problem # 3:  PERIPHERAL VASCULAR DISEASE (ICD-443.9)  now with insurance again, and new RLE pain suggestive of claudiation - for referral to Dr Lovie Macadamia  Orders: Vascular Clinic (Vascular)  Problem # 4:  CHEST PAIN (ICD-786.50)  atypical -for stress test , former smoker but known hx of severe vascular disease  Orders: Cardiolite (Cardiolite)  Problem # 5:  OSTEOARTHRITIS, HANDS, BILATERAL (ICD-715.94)  His updated medication list for this problem includes:    Adult Aspirin Ec Low Strength 81 Mg Tbec (Aspirin) .Marland Kitchen... 1 by mouth once daily    Celebrex 200 Mg Caps (Celecoxib) .Marland Kitchen... 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 1 by mouth once daily 6)  Celebrex 200 Mg Caps (Celecoxib) .Marland Kitchen.. 1po two times a day as needed pain  Other Orders: Admin 1st Vaccine ([address]) Flu Vaccine 39yrs + ([address])   Patient Instructions: 1)  you had the flu shot today 2)  You will be contacted about the referral(s) to: colonoscopy, and the stress test, and Dr Hart Rochester 3)  please follow lower cholesterol diet 4)  Please take all new medications as prescribed 5)  Continue all previous medications as before this visit  6)  Please call the number on the [facility] Card for results of your testing  - the stress test 7)  Please schedule a follow-up appointment in 6 months with :  8)  Lipid Panel prior to visit, ICD-9: 272.0 Prescriptions: CELEBREX 200 MG CAPS (CELECOXIB) 1po two times a day as needed pain  #60 x 5   Entered and Authorized by:   Corwin Levins MD   Signed by:   Corwin Levins MD on 08/27/2010   Method used:   Print then Give to  Patient   RxID:   [id number]    Immunization History:  Tetanus/Td Immunization History:    Tetanus/Td:  historical (07/30/2005)  Flu Vaccine Consent Questions     Do you have a history of severe allergic reactions to this vaccine? no    Any prior history of allergic reactions to egg and/or gelatin? no    Do you have a sensitivity to the preservative Thimersol? no    Do you have a past history of Guillan-Barre Syndrome? no    Do you currently have an acute febrile illness? no    Have you ever had a severe reaction to latex? no    Vaccine information given and explained to patient? yes    Are you currently pregnant? no    Lot Number:[id number]   Exp Date:05/29/2011   Site Given  Left Deltoid IMbflu

## 2010-12-29 NOTE — Progress Notes (Signed)
Summary: ALT TO Celebrex  Phone Note Call from Patient Call back at Home Phone [phone]   Summary of Call: Patient is requesting alternative to celebrex due to high cost. Please advise.  Initial call taken by: Lamar Sprinkles, CMA,  September 17, 2010 3:42 PM  Follow-up for Phone Call        ok to change celebrex to tramadol as needed;  would avoid further nsaids due to vascular disease and heart trouble;  also let pt know the zocor is $5 /mo  but only Karin Golden (he should call to confirm this though) Follow-up by: Corwin Levins MD,  September 17, 2010 4:03 PM  Additional Follow-up for Phone Call Additional follow up Details #1::        pt's wife [name] informed Additional Follow-up by: Lanier Prude, [facility]),  September 18, 2010 8:40 AM    New/Updated Medications: TRAMADOL HCL 50 MG TABS (TRAMADOL HCL) 1po q 6 hrs as needed pain Prescriptions: TRAMADOL HCL 50 MG TABS (TRAMADOL HCL) 1po q 6 hrs as needed pain  #60 x 2   Entered and Authorized by:   Corwin Levins MD   Signed by:   Corwin Levins MD on 09/17/2010   Method used:   Electronically to        [facility] (retail)       [address]       [place], [place]  [address]       Ph: [phone]       Fax: [phone]   RxID:   [id number]

## 2010-12-29 NOTE — Letter (Signed)
Summary: Cardiac Catheterization Instructions- [facility]  [facility], Main Office  [address]   [place], [place] [address]   Phone: [phone]  Fax: [phone]     09/16/2010 MRN: [id number]  CODYLEE PATIL [address] [place], [place]  [address]  Dear Mr. WHITENIGHT,   You are scheduled for a Cardiac Catheterization on _10/28/11________ with Dr._mclean_____________  Please arrive to the 1st floor of the Heart and Vascular Center at [facility] at _7:30____ am / pm on the day of your procedure. Please do not arrive before 6:30 a.m. Call the Heart and Vascular Center at [phone] if you are unable to make your appointmnet. The Code to get into the parking garage under the building is__0200______. Take the elevators to the 1st floor. You must have someone to drive you home. Someone must be with you for the first 24 hours after you arrive home. Please wear clothes that are easy to get on and off and wear slip-on shoes. Do not eat or drink after midnight except water with your medications that morning. Bring all your medications and current insurance cards with you.  ___ DO NOT take these medications before your procedure: ________________________________________________________________  __x_ Make sure you take your aspirin.  __x_ You may take ALL of your medications with water that morning. ________________________________________________________________________________________________________________________________  ___ DO NOT take ANY medications before your procedure.  ___ Pre-med instructions:  ________________________________________________________________________________________________________________________________  The usual length of stay after your procedure is 2 to 3 hours. This can vary.  If you have any questions, please call the office at the number listed above.   Ledon Snare, RN

## 2010-12-29 NOTE — Progress Notes (Signed)
Summary: Nuc. Pre-Procedure  Phone Note Outgoing Call Call back at Home Phone [phone]   Call placed by: Irean Hong, RN,  September 02, 2010 2:44 PM Summary of Call: Reviewed information on [employer] Information Sheet (see scanned document for further details).  Spoke with patient's wife.      Nuclear Med Background Indications for Stress Test: Evaluation for Ischemia     Symptoms: Chest Pain, Chest Pain with Exertion    Nuclear Pre-Procedure Cardiac Risk Factors: History of Smoking, Lipids, PVD Height (in): 66

## 2010-12-29 NOTE — Cardiovascular Report (Signed)
Summary: Pre Cath Orders   Pre Cath Orders   Imported By: Roderic Ovens 09/21/2010 16:05:13  _____________________________________________________________________  External Attachment:    Type:   Image     Comment:   External Document

## 2010-12-29 NOTE — Letter (Signed)
Summary: [facility] Instructions  [facility]  [address] [place], [place] [address]   Phone: [phone]  Fax: [phone]       Willie Kennedy    06/03/1951    MRN: [id number]        Procedure Day /Date:  Monday 09/07/2010     Arrival Time: 10:00 am      Procedure Time:  11:00 am     Location of Procedure:                    _ x_  [facility] (4th Floor)                        PREPARATION FOR COLONOSCOPY WITH MOVIPREP   Starting 5 days prior to your procedure Wednesday 10/5 do not eat nuts, seeds, popcorn, corn, beans, peas,  salads, or any raw vegetables.  Do not take any fiber supplements (e.g. Metamucil, Citrucel, and Benefiber).  THE DAY BEFORE YOUR PROCEDURE         DATE: Sunday 10/9 1.  Drink clear liquids the entire day-NO SOLID FOOD  2.  Do not drink anything colored red or purple.  Avoid juices with pulp.  No orange juice.  3.  Drink at least 64 oz. (8 glasses) of fluid/clear liquids during the day to prevent dehydration and help the prep work efficiently.  CLEAR LIQUIDS INCLUDE: Water Jello Ice Popsicles Tea (sugar ok, no milk/cream) Powdered fruit flavored drinks Coffee (sugar ok, no milk/cream) Gatorade Juice: apple, white grape, white cranberry  Lemonade Clear bullion, consomm, broth Carbonated beverages (any kind) Strained chicken noodle soup Hard Candy                             4.  In the morning, mix first dose of MoviPrep solution:    Empty 1 Pouch A and 1 Pouch B into the disposable container    Add lukewarm drinking water to the top line of the container. Mix to dissolve    Refrigerate (mixed solution should be used within 24 hrs)  5.  Begin drinking the prep at 5:00 p.m. The MoviPrep container is divided by 4 marks.   Every 15 minutes drink the solution down to the next mark (approximately 8 oz) until the full liter is complete.   6.  Follow completed prep with 16 oz of clear liquid of your choice (Nothing  red or purple).  Continue to drink clear liquids until bedtime.  7.  Before going to bed, mix second dose of MoviPrep solution:    Empty 1 Pouch A and 1 Pouch B into the disposable container    Add lukewarm drinking water to the top line of the container. Mix to dissolve    Refrigerate  THE DAY OF YOUR PROCEDURE      DATE: Monday 10/10  Beginning at 6:00 a.m. (5 hours before procedure):         1. Every 15 minutes, drink the solution down to the next mark (approx 8 oz) until the full liter is complete.  2. Follow completed prep with 16 oz. of clear liquid of your choice.    3. You may drink clear liquids until 9:00 am (2 HOURS BEFORE PROCEDURE).   MEDICATION INSTRUCTIONS  Unless otherwise instructed, you should take regular prescription medications with a small sip of water   as early as possible the morning of  your procedure.           OTHER INSTRUCTIONS  You will need a responsible adult at least 62 years of age to accompany you and drive you home.   This person must remain in the waiting room during your procedure.  Wear loose fitting clothing that is easily removed.  Leave jewelry and other valuables at home.  However, you may wish to bring a book to read or  an iPod/MP3 player to listen to music as you wait for your procedure to start.  Remove all body piercing jewelry and leave at home.  Total time from sign-in until discharge is approximately 2-3 hours.  You should go home directly after your procedure and rest.  You can resume normal activities the  day after your procedure.  The day of your procedure you should not:   Drive   Make legal decisions   Operate machinery   Drink alcohol   Return to work  You will receive specific instructions about eating, activities and medications before you leave.    The above instructions have been reviewed and explained to me by  Karl Bales RN  September 03, 2010 4:17 PM    I fully understand and can  verbalize these instructions _____________________________ Date _________

## 2010-12-29 NOTE — Assessment & Plan Note (Signed)
Summary: Cardiology Nuclear Testing  Nuclear Med Background Indications for Stress Test: Evaluation for Ischemia     Symptoms: Chest Pain, Chest Pain with Exertion, DOE, Fatigue, Fatigue with Exertion  Symptoms Comments: Last CP 2 days ago.   Nuclear Pre-Procedure Cardiac Risk Factors: History of Smoking, Lipids, PVD Caffeine/Decaff Intake: None NPO After: 8:00 PM Lungs: Clear IV 0.9% NS with Angio Cath: 22g     IV Site: L Antecubital IV Started by: Irean Hong, RN Chest Size (in) 38     Height (in): 66 Weight (lb): 126 BMI: 20.41  Nuclear Med Study 1 or 2 day study:  1 day     Stress Test Type:  Stress Reading MD:  Arvilla Meres, MD     Referring MD:  Oliver Barre Resting Radionuclide:  Technetium 36m Tetrofosmin     Resting Radionuclide Dose:  11 mCi  Stress Radionuclide:  Technetium 11m Tetrofosmin     Stress Radionuclide Dose:  33 mCi   Stress Protocol Exercise Time (min):  7:16 min     Max HR:  148 bpm     Predicted Max HR:  159 bpm  Max Systolic BP: 203 mm Hg     Percent Max HR:  93.08 %     METS: 8.9 Rate Pressure Product:  11914    Stress Test Technologist:  Irean Hong,  RN     Nuclear Technologist:  Domenic Polite, CNMT  Rest Procedure  Myocardial perfusion imaging was performed at rest 45 minutes following the intravenous administration of Technetium 10m Tetrofosmin.  Stress Procedure  The patient exercised for 7 minutes and 16 seconds, RPE=15.  The patient stopped due to leg weakness L>R 8/10, DOE   and denied any chest pain.  There were  significant ST-T wave changes, rare PAC,PJC. The patient had a hypertensive response to exercise.  Technetium 27m Tetrofosmin was injected at peak exercise and myocardial perfusion imaging was performed after a brief delay.  QPS Raw Data Images:  Normal; no motion artifact; normal heart/lung ratio. Stress Images:  There is moderately decreased uptake in the inferior wall Rest Images:  There is mildly decreased uptake  in the inferior wall. Subtraction (SDS):  Mild reversible defect in the inferior suggestive of ischemia. Transient Ischemic Dilatation:  1.03  (Normal <1.22)  Lung/Heart Ratio:  .24  (Normal <0.45)  Quantitative Gated Spect Images QGS EDV:  91 ml QGS ESV:  44 ml QGS EF:  52 %  Findings Abnormal nuclear study Evidence for inferior ischemia      Overall Impression  Exercise Capacity: Fair exercise capacity. BP Response: Hypertensive blood pressure response. Clinical Symptoms: There is dyspnea. ECG Impression: Significant ST abnormalities consistent with ischemia. 1mm inferolateral ST depression  Appended Document: Cardiology Nuclear Testing LMOPT - stress test abnormal , suggestive of CAD and lack of blood flow to at least one artery      1)   refer card  robin - to call pt to inform, and arrange any above - I will do referral  Appended Document: Cardiology Nuclear Testing called pt informed of above information

## 2010-12-29 NOTE — Progress Notes (Signed)
Summary: calling about medication Zocor  Phone Note Call from Patient Call back at Home Phone [phone]   Caller: Spouse/ josie Summary of Call: Pt wife calling with question about medication Zocor 40mg  Initial call taken by: Judie Grieve,  September 17, 2010 3:14 PM  Follow-up for Phone Call        I called and spoke with the pt's wife. She states that [facility] is not accepting the pt's medication card and they are having a hard time filling his simvastatin. They use Medtrex. I explained I have not heard of this. She states his insurance plan has told him this should be accepted with most pharmacies. I have advised her to call his insurance and let them know about the trouble they are having. I have also explained another alternative would be to see if Dr. Shirlee Latch may switch him to Pravastatin since this is covered on the $4 plan at [facility]. I will leave this message for Dr. Shirlee Latch and his nurse and ask that they call her back early next week. She is agreeable. Follow-up by: Sherri Rad, RN, BSN,  September 17, 2010 3:55 PM     Appended Document: calling about medication Zocor I think simvastatin also should be $4  Appended Document: calling about medication Zocor simva should be $4 at [facility].  If can't do this, take pravastatin 80 mg daily.   Appended Document: calling about medication Zocor LMTCB  Appended Document: calling about medication Zocor I talked with wife--pt was able to get Simvastatin   Clinical Lists Changes

## 2010-12-29 NOTE — Assessment & Plan Note (Signed)
Summary: NEW PT / Willie Kennedy /$50 Natale Milch   Vital Signs:  Patient Profile:   60 Years Old Male Weight:      141 pounds Temp:     97.6 degrees F oral Pulse rate:   66 / minute BP sitting:   116 / 78  (right arm) Cuff size:   regular  Vitals Entered By: Payton Spark CMA (September 06, 2008 1:17 PM)                 Chief Complaint:  new Pt.  History of Present Illness: overall doing well, no complaints    Updated Prior Medication List: * NOS PUMP  * GLUTAMINE  * CHROMIUM PICOLINATE  * DHEA  ADULT ASPIRIN EC LOW STRENGTH 81 MG TBEC (ASPIRIN) 1 by mouth once daily  Current Allergies (reviewed today): No known allergies   Past Medical History:    Reviewed history and no changes required:       Peripheral vascular disease       former smoker  Past Surgical History:    Reviewed history and no changes required:       ds/p left femoral artery occlusion 2005 s/p left fem-pop bypass       s/p right bicep tendon rupture repair   Family History:    Reviewed history and no changes required:       father with ETOH, smoker, lung cancer, stroke       mother with ovary cancer  Social History:    Reviewed history and no changes required:       Married       no biological children       work - IT sales professional       Former Smoker       Alcohol use-no   Risk Factors:  Tobacco use:  quit Alcohol use:  no   Review of Systems  The patient denies anorexia, fever, weight loss, weight gain, vision loss, decreased hearing, hoarseness, chest pain, syncope, dyspnea on exertion, peripheral edema, prolonged cough, headaches, hemoptysis, abdominal pain, melena, hematochezia, severe indigestion/heartburn, hematuria, incontinence, muscle weakness, suspicious skin lesions, transient blindness, difficulty walking, depression, unusual weight change, abnormal bleeding, enlarged lymph nodes, angioedema, and breast masses.         all otherwise negative    Physical Exam   General:     alert and well-developed., muscular, athletic appearing Head:     Normocephalic and atraumatic without obvious abnormalities. No apparent alopecia or balding. Eyes:     No corneal or conjunctival inflammation noted. EOMI. Perrla. Ears:     External ear exam shows no significant lesions or deformities.  Otoscopic examination reveals clear canals, tympanic membranes are intact bilaterally without bulging, retraction, inflammation or discharge. Hearing is grossly normal bilaterally. Nose:     External nasal examination shows no deformity or inflammation. Nasal mucosa are pink and moist without lesions or exudates. Mouth:     Oral mucosa and oropharynx without lesions or exudates.  Teeth in good repair. Neck:     No deformities, masses, or tenderness noted. Lungs:     Normal respiratory effort, chest expands symmetrically. Lungs are clear to auscultation, no crackles or wheezes. Heart:     Normal rate and regular rhythm. S1 and S2 normal without gallop, murmur, click, rub or other extra sounds. Abdomen:     Bowel sounds positive,abdomen soft and non-tender without masses, organomegaly or hernias noted. Genitalia:     Testes bilaterally descended without nodularity,  tenderness or masses. No scrotal masses or lesions. No penis lesions or urethral discharge. Msk:     No deformity or scoliosis noted of thoracic or lumbar spine.   Extremities:     No clubbing, cyanosis, edema, or deformity noted with normal full range of motion of all joints.   Neurologic:     alert & oriented X3, cranial nerves II-XII intact, and strength normal in all extremities.      Impression & Recommendations:  Problem # 1:  Preventive Health Care (ICD-V70.0) Overall doing well, up to date, counseled on routine health concerns for screening and prevention, immunizations up to date or declined, labs reviewed, ecg reviewed    Orders: EKG w/ Interpretation (93000) TLB-BMP (Basic Metabolic Panel-BMET)  (80048-METABOL) TLB-CBC Platelet - w/Differential (85025-CBCD) TLB-Hepatic/Liver Function Pnl (80076-HEPATIC) TLB-Lipid Panel (80061-LIPID) TLB-TSH (Thyroid Stimulating Hormone) (84443-TSH) TLB-Udip ONLY (81003-UDIP)   Problem # 2:  PERIPHERAL VASCULAR DISEASE (ICD-443.9) to start ASA 81 mg . - 1  per day  Complete Medication List: 1)  Nos Pump  2)  Glutamine  3)  Chromium Picolinate  4)  Dhea  5)  Adult Aspirin Ec Low Strength 81 Mg Tbec (Aspirin) .Marland Kitchen.. 1 by mouth once daily  Other Orders: Influenza Vaccine NON MCR (06237)   Patient Instructions: 1)  Continue all medications that you may have been taking previously 2)  VERY IMPORTANT - Take an Aspirin every day - 81 mg - 1 per day - COATED only (OTC at any drug store) 3)  Please go to the Lab in the basement for your blood tests today 4)  Please schedule a follow-up appointment in 1 year or sooner if needed   ]  Influenza Vaccine    Vaccine Type: Fluvax Non-MCR    Site: right deltoid    Mfr: GlaxoSmithKline    Dose: 0.5 ml    Route: IM    Given by: Payton Spark CMA    Exp. Date: 05/28/2009    Lot #: [id number]    VIS given: 06/22/07 version given September 06, 2008.  Flu Vaccine Consent Questions    Do you have a history of severe allergic reactions to this vaccine? no    Any prior history of allergic reactions to egg and/or gelatin? no    Do you have a sensitivity to the preservative Thimersol? no    Do you have a past history of Guillan-Barre Syndrome? no    Do you currently have an acute febrile illness? no    Have you ever had a severe reaction to latex? no    Vaccine information given and explained to patient? yes

## 2010-12-29 NOTE — Letter (Signed)
Summary: Results Letter  [facility]  [address] [place], [place] [address]   Phone: [phone]  Fax: [phone]        September 09, 2010 MRN: [id number]    Willie Kennedy [address] [place], [place]  [address]    Dear Willie Kennedy,   Good news.  The polyp that was removed during your recent procedure was NOT pre-cancerous.  You should continue to follow current colorectal cancer screening guidelines with a repeat colonoscopy in 10 years.  We will therefore put your information in our reminder system and will contact you in 10 years to schedule a repeat procedure.  Please call if you have any questions or concerns.       Sincerely,  Rachael Fee MD  This letter has been electronically signed by your physician.  Appended Document: Results Letter letter mailed

## 2010-12-29 NOTE — Miscellaneous (Signed)
Summary: Orders Update  Clinical Lists Changes  Problems: Added new problem of STRESS ELECTROCARDIOGRAM, ABNORMAL (ICD-794.31) Added new problem of DYSPNEA ON EXERTION (ICD-786.09) Orders: Added new Referral order of Cardiology Referral (Cardiology) - Signed

## 2011-02-19 ENCOUNTER — Other Ambulatory Visit: Payer: Self-pay

## 2011-02-26 ENCOUNTER — Ambulatory Visit: Payer: Self-pay | Admitting: Internal Medicine

## 2011-04-13 NOTE — Procedures (Signed)
BYPASS GRAFT EVALUATION   INDICATION:  A followup from a left femoral-to-popliteal bypass graft  with nonreversed saphenous vein Dr. Hart Rochester did in 2007.   HISTORY:  Diabetes:  No.  Cardiac:  No.  Hypertension:  No.  Smoking:  Quit in 2000.  Previous Surgery:  Left femoral-to-popliteal bypass graft with  nonreversed saphenous vein in 2007 by Dr. Hart Rochester.   SINGLE LEVEL ARTERIAL EXAM                               RIGHT              LEFT  Brachial:                    122                120  Anterior tibial:             125                126  Posterior tibial:            144                141  Peroneal:  Ankle/brachial index:        1.18               1.16   PREVIOUS ABI:  Date: 10/25/2006  RIGHT:  >1.0  LEFT:  >1.0   LOWER EXTREMITY BYPASS GRAFT DUPLEX EXAM:   DUPLEX:  Doppler arterial waveforms are triphasic proximal to,  throughout, and distal to the graft.  Velocities are within normal  limits.   IMPRESSION:  1. Patent left femoral-to-popliteal bypass graft with reverse      saphenous vein.  2. The ankle brachial indices are stable.  3. This examination is essentially unchanged from prior examination.   ___________________________________________  Quita Skye Hart Rochester, M.D.   OD/MEDQ  D:  10/13/2010  T:  10/13/2010  Job:  [id number]

## 2011-04-13 NOTE — Consult Note (Signed)
NEW PATIENT CONSULTATION   Saraceni, Richard E  DOB:  November 07, 1951                                       10/13/2010  CHART#:[id number]   Mr. Horger is a 60 year old male patient with a history of peripheral  vascular disease having undergone left femoral-popliteal bypass grafting  by me in 2005.  He does not have limiting claudication and has not since  his surgery but does have some numbness and tingling medially in the  left leg in the distribution of the saphenous nerve.  He is limited more  by dyspnea on exertion than his ambulation.  He denies any rest pain or  nonhealing ulcers.  He also denies any neurologic symptoms such as  hemiparesis, aphasia, amaurosis fugax, diplopia, blurred vision or  syncope.   Chronic medical problems:  1. Hyperlipidemia.  2. Chest pain, etiology unknown, recent cardiac cath with mild      coronary disease.  3. Osteoarthritis.  4. Peripheral vascular disease.  5. Negative for COPD or stroke or diabetes.   SOCIAL HISTORY:  He is married, is a Hospital doctor, does not use  tobacco or alcohol.   FAMILY HISTORY:  Not really available, did not know his family well but  no known history of coronary artery disease, diabetes or stroke.   REVIEW OF SYSTEMS:  Positive for weight loss, arthritis, joint pain,  decreased hearing, dyspnea on exertion, and anxiety.  All other systems  in review of systems are negative.   PHYSICAL EXAMINATION:  Blood pressure 90/62, heart rate 61, respirations  14.  GENERAL:  He is a well-developed, well-nourished male in no apparent  distress, alert and oriented x3.  HEENT:  Exam normal for age.  EOMs intact.  LUNGS:  Clear to auscultation.  No rhonchi or wheezing.  CARDIOVASCULAR:  Regular rhythm.  No murmurs.  Carotid pulses 3+.  No  audible bruits.  ABDOMEN:  Soft, nontender with no masses.  MUSCULOSKELETAL:  Exam is free of major deformities.  NEUROLOGIC:  Normal.  SKIN:  Free of rashes.  Lower extremity exam reveals 3+ femoral and popliteal pulses bilaterally  with well-perfused lower extremities.   Today I ordered lower extremity arterial Doppler studies to review his  left femoral-popliteal graft which has been patent for 6 years.  The  graft is functioning nicely with no evidence of high velocity and  triphasic flow in both feet with normal ABIs.   I am very pleased with his a result dating out 6 years and will continue  to follow him on an annual basis for vein graft stenosis or progression  of distal disease.     Quita Skye Hart Rochester, M.D.  Electronically Signed   JDL/MEDQ  D:  10/13/2010  T:  10/14/2010  Job:  [id number]

## 2011-04-16 NOTE — Discharge Summary (Signed)
Willie Kennedy, Willie Kennedy NO.:  [id number]   MEDICAL RECORD NO.:  [id number]          PATIENT TYPE:  INP   LOCATION:  3316                         FACILITY:  [facility]   PHYSICIAN:  Quita Skye. Hart Rochester, M.D.  DATE OF BIRTH:  05/31/1951   DATE OF ADMISSION:  10/09/2004  DATE OF DISCHARGE:  10/12/2004                                 DISCHARGE SUMMARY   ADMISSION DIAGNOSES:  Left lower extremity claudication secondary to left  superficial femoral artery occlusion.   DISCHARGE DIAGNOSES:  1.  Left superficial femoral artery occlusion status post left femoral-to-      below-the-knee-popliteal bypass completed October 09, 2004.  2.  Moderate xeroderma consistent with psoriasis.  3.  History of reattachment of distal right biceps tendon completed      September 2003.   PROCEDURES:  Left-femoral-to-below-the-knee-popliteal bypass utilizing a non-  reverse translocated greater saphenous vein harvested from the left leg and  intraoperative arteriogram completed on October 09, 2004, by Dr. Hart Rochester.   CONSULTATIONS:  None.   HISTORY OF PRESENT ILLNESS:  Willie Kennedy is a 60 year old white male who  presented to his primary care Aviva Wolfer with a several-month history of left  lower extremity claudication symptoms.  He described increasing discomfort  in the left calf over the last several months and worsening to limiting his  capacity.  The patient was only able to walk about 150 feet before his calf  was throbbing so badly that he had to stop and rest.  The patient had no  symptoms on the right leg.  The patient works as a Archivist and has no  other significant past medical history.  The patient was seen and examined  by Dr. Hart Rochester in consultation on September 22, 2004.  At that time, Dr. Hart Rochester  felt the patient likely had left superficial femoral artery occlusion and  would be a good candidate for femoral-popliteal bypass grafting to relieve  his symptoms.  An angiogram was  completed which confirmed superficial  femoral artery occlusion, and the patient was scheduled for peripheral  bypass surgery.  Risks, benefits, and alternatives were discussed with the  patient at that time.  The patient was in understanding and agreed to  proceed with surgery.   HOSPITAL COURSE:  Willie Kennedy was admitted electively to [facility]  on October 09, 2004.  He was taken to the operating room and underwent Left-  femoral-to-below-the-knee-popliteal bypass utilizing a non-reverse  translocated greater saphenous vein.  The saphenous vein was harvested from  the left leg using open technique.  Intraoperative arteriogram was completed  and showed good blood flow to the distal left lower extremity artery.  Overall, the patient tolerated this procedure well and was extubated on the  operating room table.  The patient was then transferred to the  postanesthesia care unit in stable condition.  Once awake, alert, and  appropriate, the patient was transferred to 3300 for further management.   Over the next several hospital days, the patient continued to make slow but  steady progress towards recovery.  He had strong 2  to 3+ dorsalis pedis and  posterior tibial pulses in the left lower extremity as well as on the right.  He resumed a regular diet.  Pain was well controlled on oral medications.  The patient was ambulating independently using a rolling walker.  The  patient resumed normal bowel and bladder functions.  He remained  hemodynamically stable.  The patient's incisions were healing well without  evidence of infection.   The patient was deemed appropriate for initiation of discharge planning on  October 11, 2004, or postop day #2.  At that time, he was doing quite well.  On physical exam, his heart was in regular rate and rhythm, normal sinus  rhythm on telemetry.  Lungs were clear to auscultation.  Abdomen soft,  nontender, nondistended with good bowel sounds.   Extremities were without  edema.  His incisions were healing well with only mild erythema around the  groin and proximal incisions.  There was no evidence of fluctuation or  drainage.   DISPOSITION:  Willie Kennedy is discharged to home in improved and stable  condition. We will continue as planned with discharge in the a.m. of  October 12, 2004, pending morning rounds and no change in patient's  clinical status.   DISCHARGE MEDICATIONS:  The patient is to continue his home medications of:  1.  Histinex 1 to 2 tablets 4 times daily as needed.  2.  Over-the-counter vitamins and supplement.  3.  Tylox 1 to 2 tablets every 4 to 6 hours as needed.   DISCHARGE INSTRUCTIONS:  1.  Activity:  The patient is to avoid driving.  He is to avoid heavy      lifting or strenuous activity.  He is to continue to walk daily.  2.  Diet:  The patient should follow a low-fat, low-salt diet.  3.  Wound care:  The patient may shower.  He should wash his incisions daily      with soap and water.  4.  He should notify the CVTS office if he has any increased redness,      swelling, or drainage from his incision sits.   FOLLOW UP:  The patient is to see Dr. Hart Rochester within two to three weeks of  discharge.  The CVTS office will call the patient with this exact date and  time.  The patient will undergo lower extremity ABIs during that visit.  Otherwise, he is instructed to notify the CVTS office if he has any  questions or concerns in the meantime.       CAF/MEDQ  D:  10/11/2004  T:  10/11/2004  Job:  [id number]   cc:   Lucky Cowboy, M.D.  [address]  [place], [place] [address]  Fax: [phone]

## 2011-04-16 NOTE — Op Note (Signed)
Willie Kennedy, Willie Kennedy NO.:  [id number]   MEDICAL RECORD NO.:  [id number]          PATIENT TYPE:  INP   LOCATION:  3316                         FACILITY:  [facility]   PHYSICIAN:  Quita Skye. Hart Rochester, M.D.  DATE OF BIRTH:  12/07/1950   DATE OF PROCEDURE:  10/09/2004  DATE OF DISCHARGE:                                 OPERATIVE REPORT   PREOPERATIVE DIAGNOSIS:  Left femoral popliteal occlusive disease with  limiting claudication.   POSTOPERATIVE DIAGNOSIS:  Left femoral popliteal occlusive disease with  limiting claudication.   OPERATION:  Left common femoral to popliteal (below the knee) bypass using a  nonreversed translocated saphenous vein graft from the left leg with  interoperative arteriogram.   SURGEON:  Quita Skye. Hart Rochester, M.D.   FIRST ASSISTANT:  Carolyn A. Thelma Barge, P.A.-C.   ANESTHESIA:  General endotracheal anesthesia.   PROCEDURE:  The patient was taken to the operating room and placed in the  supine position at which time satisfactory general endotracheal anesthesia  was administered.  The left leg was prepped with Betadine scrubbing solution  and draped in routine sterile manner.  The saphenous vein was exposed at the  saphenofemoral junction and removed to the mid calf through multiple  incisions along the medial aspect of the left leg.  Its branches were  ligated with 4-0 and 5-0 silk ties and divided.  It was removed and gently  dilated with heparinized saline and marked for orientation purposes.  It was  an adequate vein being 2.5 to 3 mm in size.  The common, superficial, and  profunda femoris arteries were dissected free in the inguinal incision.  The  common femoral artery had a posterior plaque but had a good pulse  anteriorly.  The superficial femoral artery was chronically occluded.  The  popliteal artery was then exposed in a below the knee position.  It did have  some calcification distally near the tibial peroneal trunk but was very soft  proximally.  A subfascial anatomic tunnel was created and the patient was  heparinized.  Femoral vessels were occluded with vascular clamps, a  longitudinal opening made in the common femoral with a 15 blade, extended  with the Potts scissors.  The saphenous vein was then spatulated and  anastomosed end-to-side with 6-0 Prolene.  The clamps were then released and  there was an excellent pulse down to the first set of competent valves.  Using a retrograde valvulotome, the valves were rendered incompetent with  resulting excellent flow out of the distal end of the vein graft.  The vein  was carefully delivered through the tunnel, the popliteal artery opened  below the knee with a 15 blade and extended with the Potts scissors.  It was  a widely patent vessel, approximately 3 to 3.5 mm in size.  The vein was  carefully spatulated and anastomosed end-to-side with 6-0 Prolene.  The  clamps were released and there was an excellent pulse in the vein graft and  a much improved Doppler flow at the foot.  Interoperative arteriogram  revealed a widely patent anastomosis with  three vessel run off.  Protamine  was given to reverse the  heparin.  Following adequate hemostasis, the wound was irrigated with  saline, closed  in layers with Vicryl in subcuticular fashion with Steri-  Strips, a sterile dressing was applied.  The patient was taken to the  recovery room in satisfactory condition.       JDL/MEDQ  D:  10/09/2004  T:  10/09/2004  Job:  [id number]

## 2011-04-16 NOTE — Op Note (Signed)
NAME:  Willie Kennedy, Willie Kennedy                           ACCOUNT NO.:  [id number]   MEDICAL RECORD NO.:  [id number]                   PATIENT TYPE:  AMB   LOCATION:  DSC                                  FACILITY:  [facility]   PHYSICIAN:  Artist Pais. Mina Marble, M.D.           DATE OF BIRTH:  07-Oct-1951   DATE OF PROCEDURE:  10/03/2002  DATE OF DISCHARGE:                                 OPERATIVE REPORT   PREOPERATIVE DIAGNOSIS:  Distal biceps rupture, right side.   POSTOPERATIVE DIAGNOSIS:  Distal biceps rupture, right side.   PROCEDURE:  Repair of above using Arthrex Bio-Tenodesis  screw, 8 x 12 mm.   SURGEON:  Artist Pais. Mina Marble, M.D.   ASSISTANT:  None.   ANESTHESIA:  General.   TOURNIQUET TIME:  1 hour and 10 minutes.   COMPLICATIONS:  None.   DRAINS:  None.   DESCRIPTION OF PROCEDURE:  The patient was taken to the operating room and  after the induction of adequate general anesthesia the right upper extremity  was prepped and draped  in the usual sterile fashion. An Esmarch was used to  exsanguinate the limb. The tourniquet was then inflated to 250 mmHg.   At this point in time and incision was made centered over the antecubital  fossa of the right forearm volarly, with a proximal limb extending on the  lateral side and a distal limb on the medial side. The incision was taken  down through the skin and subcutaneous tissues.  Care was used to identify  and retract a large branch of the cephalic vein. The lateral antebrachial  cutaneous nerve was identified and retracted to the lateral side and the  obvious biceps rupture was identified.   The biceps tendon had a significant  pseudotendon in its form. The  pseudotendon was incised. The tendon was found balled up inside the  pseudotendon. The pseudotendon was dissected to the musculotendinous  junction, incised  and then traced distally to the insertion site. At this  point in time the entire pseudotendon was excised. The tendon  itself was  then debrided of fibrous tissue and was sutured using a #2 fiber wire in a  Krakow type stitch.   Next using the appropriate technique for the Arthrex 12 x 8 mm Bio-Tenodesis  screw, the biceps tendon  was repaired into the drill hole under appropriate  tension. After repair was done, the elbow was taken through a good range of  motion. The biceps tendon appeared to be taught in its final anatomic  position.   The wound was then thoroughly irrigated and loosely closed with 4-0 Vicryl  and a running 3-0 Prolene subcuticular stitch in the skin. Steri-Strips, 4 x  4s, fluffs and a splint with the wrist in neutral, the forearm in neutral  and the elbow flexed to 90 degrees was applied.   The patient tolerated the procedure well. He went to the  recovery room in  stable fashion.                                               Artist Pais Mina Marble, M.D.    MAW/MEDQ  D:  10/03/2002  T:  10/03/2002  Job:  [id number]

## 2011-04-16 NOTE — Op Note (Signed)
Willie Kennedy, Willie Kennedy NO.:  [id number]   MEDICAL RECORD NO.:  [id number]          PATIENT TYPE:  INP   LOCATION:  NA                           FACILITY:  [facility]   PHYSICIAN:  Quita Skye. Hart Rochester, M.D.  DATE OF BIRTH:  [birth year]   DATE OF PROCEDURE:  10/01/2004  DATE OF DISCHARGE:                                 OPERATIVE REPORT   PREOPERATIVE DIAGNOSIS:  Left leg claudication secondary to femoral-  popliteal occlusive disease.   POSTOPERATIVE DIAGNOSIS:  Left leg claudication secondary to femoral-  popliteal occlusive disease.   OPERATION PERFORMED:  1.  Abdominal aortogram with bilateral lower extremity runoff via right      common femoral approach.  2.  Left leg angiogram via selective left common iliac catheterization.   SURGEON:  Quita Skye. Hart Rochester, M.D.   ANESTHESIA:  Local Xylocaine.   CONTRAST:  110 mL.   COMPLICATIONS:  None.   DESCRIPTION OF PROCEDURE:  The patient was taken to the [facility]  peripheral endovascular cath lab and placed in supine position at which time  both groins were prepped with Betadine scrub and solution and draped in the  routine sterile manner.  After infiltration with 1% Xylocaine, the right  common femoral artery was entered percutaneously, guidewire passed into the  suprarenal aorta under fluoroscopic guidance.  A 5 French sheath and dilator  were passed over the guidewire, dilator removed and a standard pigtail  catheter positioned in the suprarenal aorta.  A flush abdominal aortogram  was performed injecting 20 mL of contrast at 20 mL per second.  This  revealed the aorta to be widely patent with single widely patent renal  arteries bilaterally.  The inferior mesenteric artery was small but patent.  Both common, internal and external iliac arteries had no areas of  significant stenosis and were patent. The catheter was withdrawn into the  terminal aorta and bilateral lower extremity runoff performed injecting 77  mL of  contrast at 7 mL per second.  This revealed the right common femoral,  superficial femoral and profunda femoris arteries to be widely patent as was  the right popliteal artery.  There was three vessel runoff on the right.  On  the left side, the common femoral artery was patent down to its termination.  The left superficial femoral artery  was totally occluded and reconstituted  about 5 or 6 cm proximal to the knee joint, traversed the knee joint with  three vessel runoff on the left.  The left profunda was patent.  Following  this, the pigtail catheter was removed over the guidewire and the left  common iliac artery cannulated using an IMA catheter and then using the PEAK  hold technique,  better detail of the proximal popliteal artery and the  runoff on the left was obtained.  This confirmed the above findings.  The  IMA catheter was removed over the guidewire, the sheath removed, adequate  compression applied, no complications ensued.   FINDINGS:  1.  Widely patent aortoiliac system with single widely patent renal arteries  bilaterally.  2.  Normal runoff on the right.  3.  Left superficial femoral occlusion, reconstitution of popliteal artery      above the knee and three vessel runoff on the left.       JDL/MEDQ  D:  10/01/2004  T:  10/01/2004  Job:  [id number]

## 2012-11-16 ENCOUNTER — Encounter: Payer: Self-pay | Admitting: Vascular Surgery

## 2013-03-30 ENCOUNTER — Telehealth: Payer: Self-pay | Admitting: *Deleted

## 2013-03-30 DIAGNOSIS — Z125 Encounter for screening for malignant neoplasm of prostate: Secondary | ICD-10-CM

## 2013-03-30 DIAGNOSIS — Z0000 Encounter for general adult medical examination without abnormal findings: Secondary | ICD-10-CM

## 2013-03-30 NOTE — Telephone Encounter (Signed)
Received staff mmsg pt made cpx for June. Entering cpx labs...lmb

## 2013-03-30 NOTE — Telephone Encounter (Signed)
Message copied by Deatra James on Fri Mar 30, 2013  9:19 AM ------      Message from: Etheleen Sia      Created: Thu Mar 29, 2013  1:20 PM      Regarding: LAB       PHYSICAL LABS FOR June 6 APPT ------

## 2013-04-27 ENCOUNTER — Other Ambulatory Visit (INDEPENDENT_AMBULATORY_CARE_PROVIDER_SITE_OTHER): Payer: BC Managed Care – PPO

## 2013-04-27 DIAGNOSIS — Z125 Encounter for screening for malignant neoplasm of prostate: Secondary | ICD-10-CM

## 2013-04-27 DIAGNOSIS — Z0000 Encounter for general adult medical examination without abnormal findings: Secondary | ICD-10-CM

## 2013-04-27 LAB — LIPID PANEL
Cholesterol: 185 mg/dL (ref 0–200)
HDL: 44 mg/dL
LDL Cholesterol: 131 mg/dL — ABNORMAL HIGH (ref 0–99)
Total CHOL/HDL Ratio: 4
Triglycerides: 52 mg/dL (ref 0.0–149.0)
VLDL: 10.4 mg/dL (ref 0.0–40.0)

## 2013-04-27 LAB — URINALYSIS, ROUTINE W REFLEX MICROSCOPIC
Bilirubin Urine: NEGATIVE
Hgb urine dipstick: NEGATIVE
Ketones, ur: NEGATIVE
Leukocytes, UA: NEGATIVE
Nitrite: NEGATIVE
RBC / HPF: NONE SEEN (ref 0–?)
Specific Gravity, Urine: 1.025 (ref 1.000–1.030)
Total Protein, Urine: NEGATIVE
Urine Glucose: NEGATIVE
Urobilinogen, UA: 0.2 (ref 0.0–1.0)
WBC, UA: NONE SEEN (ref 0–?)
pH: 6 (ref 5.0–8.0)

## 2013-04-27 LAB — BASIC METABOLIC PANEL
BUN: 19 mg/dL (ref 6–23)
CO2: 30 mEq/L (ref 19–32)
Calcium: 8.9 mg/dL (ref 8.4–10.5)
Chloride: 105 mEq/L (ref 96–112)
Creatinine, Ser: 1 mg/dL (ref 0.4–1.5)
GFR: 80.92 mL/min (ref >60.00)
Glucose, Bld: 99 mg/dL (ref 70–99)
Potassium: 4.5 mEq/L (ref 3.5–5.1)
Sodium: 139 mEq/L (ref 135–145)

## 2013-04-27 LAB — CBC WITH DIFFERENTIAL/PLATELET
Basophils Absolute: 0 K/uL (ref 0.0–0.1)
Basophils Relative: 0.5 % (ref 0.0–3.0)
Eosinophils Absolute: 0.2 K/uL (ref 0.0–0.7)
Eosinophils Relative: 2.5 % (ref 0.0–5.0)
HCT: 45.6 % (ref 39.0–52.0)
Hemoglobin: 15.5 g/dL (ref 13.0–17.0)
Lymphocytes Relative: 32.2 % (ref 12.0–46.0)
Lymphs Abs: 2.2 K/uL (ref 0.7–4.0)
MCHC: 33.9 g/dL (ref 30.0–36.0)
MCV: 93.2 fl (ref 78.0–100.0)
Monocytes Absolute: 0.6 K/uL (ref 0.1–1.0)
Monocytes Relative: 8.4 % (ref 3.0–12.0)
Neutro Abs: 3.9 K/uL (ref 1.4–7.7)
Neutrophils Relative %: 56.4 % (ref 43.0–77.0)
Platelets: 260 K/uL (ref 150.0–400.0)
RBC: 4.89 Mil/uL (ref 4.22–5.81)
RDW: 14.4 % (ref 11.5–14.6)
WBC: 6.9 K/uL (ref 4.5–10.5)

## 2013-04-27 LAB — HEPATIC FUNCTION PANEL
ALT: 27 U/L (ref 0–53)
AST: 24 U/L (ref 0–37)
Albumin: 4 g/dL (ref 3.5–5.2)
Alkaline Phosphatase: 77 U/L (ref 39–117)
Bilirubin, Direct: 0.1 mg/dL (ref 0.0–0.3)
Total Bilirubin: 0.9 mg/dL (ref 0.3–1.2)
Total Protein: 6.8 g/dL (ref 6.0–8.3)

## 2013-04-27 LAB — PSA: PSA: 0.62 ng/mL (ref 0.10–4.00)

## 2013-04-27 LAB — TSH: TSH: 4.49 u[IU]/mL (ref 0.35–5.50)

## 2013-05-04 ENCOUNTER — Ambulatory Visit (INDEPENDENT_AMBULATORY_CARE_PROVIDER_SITE_OTHER): Payer: BC Managed Care – PPO | Admitting: Internal Medicine

## 2013-05-04 ENCOUNTER — Encounter: Payer: Self-pay | Admitting: Internal Medicine

## 2013-05-04 VITALS — BP 122/80 | HR 61 | Temp 97.0°F | Ht 65.0 in | Wt 143.4 lb

## 2013-05-04 DIAGNOSIS — I2510 Atherosclerotic heart disease of native coronary artery without angina pectoris: Secondary | ICD-10-CM

## 2013-05-04 DIAGNOSIS — R7309 Other abnormal glucose: Secondary | ICD-10-CM

## 2013-05-04 DIAGNOSIS — Z7189 Other specified counseling: Secondary | ICD-10-CM | POA: Insufficient documentation

## 2013-05-04 DIAGNOSIS — Z515 Encounter for palliative care: Secondary | ICD-10-CM | POA: Insufficient documentation

## 2013-05-04 DIAGNOSIS — R7302 Impaired glucose tolerance (oral): Secondary | ICD-10-CM | POA: Insufficient documentation

## 2013-05-04 DIAGNOSIS — E785 Hyperlipidemia, unspecified: Secondary | ICD-10-CM

## 2013-05-04 DIAGNOSIS — Z23 Encounter for immunization: Secondary | ICD-10-CM

## 2013-05-04 DIAGNOSIS — M19049 Primary osteoarthritis, unspecified hand: Secondary | ICD-10-CM

## 2013-05-04 DIAGNOSIS — Z2911 Encounter for prophylactic immunotherapy for respiratory syncytial virus (RSV): Secondary | ICD-10-CM

## 2013-05-04 DIAGNOSIS — Z0000 Encounter for general adult medical examination without abnormal findings: Secondary | ICD-10-CM

## 2013-05-04 MED ORDER — ATORVASTATIN CALCIUM 20 MG PO TABS: 20.0000 mg | ORAL_TABLET | Freq: Every day | ORAL | Status: DC

## 2013-05-04 MED ORDER — LISINOPRIL 2.5 MG PO TABS: 2.5000 mg | ORAL_TABLET | Freq: Every day | ORAL | Status: DC

## 2013-05-04 MED ORDER — TRAMADOL HCL 50 MG PO TABS: 50.0000 mg | ORAL_TABLET | Freq: Four times a day (QID) | ORAL | Status: DC | PRN

## 2013-05-04 MED ORDER — NITROGLYCERIN 0.4 MG SL SUBL: 0.4000 mg | SUBLINGUAL_TABLET | SUBLINGUAL | Status: DC | PRN

## 2013-05-04 NOTE — Assessment & Plan Note (Signed)
To change to zocor to Lipitor 20 qd,  to f/u any worsening symptoms or concerns Lab Results  Component Value Date   LDLCALC 131* 04/27/2013

## 2013-05-04 NOTE — Addendum Note (Signed)
Addended by: Scharlene Gloss B on: 05/04/2013 10:58 AM   Modules accepted: Orders

## 2013-05-04 NOTE — Assessment & Plan Note (Addendum)
Overall doing well, age appropriate education and counseling updated, referrals for preventative services and immunizations addressed, dietary and smoking counseling addressed, most recent labs reviewed.  I have personally reviewed and have noted: 1) the patient's medical and social history 2) The pt's use of alcohol, tobacco, and illicit drugs 3) The patient's current medications and supplements 4) Functional ability including ADL's, fall risk, home safety risk, hearing and visual impairment 5) Diet and physical activities 6) Evidence for depression or mood disorder 7) The patient's height, weight, and BMI have been recorded in the chart I have made referrals, and provided counseling and education based on review of the above ECG reviewed as per emr, for shingles shot today

## 2013-05-04 NOTE — Patient Instructions (Signed)
You had the shingles shot today OK to stop the zocor as you have Please take all new medication as prescribed - the generic Lipitor at 20 mg per day Please continue all other medications as before, and all refills have been done as requested. Please continue your efforts at being more active, low cholesterol diet, and weight control. You are otherwise up to date with prevention measures today. You will be contacted regarding the referral for: cardiology  Please remember to sign up for My Chart if you have not done so, as this will be important to you in the future with finding out test results, communicating by private email, and scheduling acute appointments online when needed.  Please return in 1 year for your yearly visit, or sooner if needed, with Lab testing done 3-5 days before

## 2013-05-04 NOTE — Assessment & Plan Note (Signed)
Due for card f/u - will refer

## 2013-05-04 NOTE — Assessment & Plan Note (Signed)
For tylenol and tramadol for breakthrough pain

## 2013-05-04 NOTE — Assessment & Plan Note (Signed)
.  mild, now improved,  to f/u any worsening symptoms or concerns Lab Results  Component Value Date   WBC 6.9 04/27/2013   HGB 15.5 04/27/2013   HCT 45.6 04/27/2013   PLT 260.0 04/27/2013   GLUCOSE 99 04/27/2013   CHOL 185 04/27/2013   TRIG 52.0 04/27/2013   HDL 44.00 04/27/2013   LDLCALC 131* 04/27/2013   ALT 27 04/27/2013   AST 24 04/27/2013   NA 139 04/27/2013   K 4.5 04/27/2013   CL 105 04/27/2013   CREATININE 1.0 04/27/2013   BUN 19 04/27/2013   CO2 30 04/27/2013   TSH 4.49 04/27/2013   PSA 0.62 04/27/2013   INR 0.9 ratio 09/15/2010

## 2013-05-04 NOTE — Progress Notes (Addendum)
Subjective:    Patient ID: Willie Kennedy, male    DOB: [birth year], 62 y.o.   MRN: [id number]  HPI  Here for wellness and f/u;  Overall doing ok;  Pt denies CP, worsening SOB, DOE, wheezing, orthopnea, PND, worsening LE edema, palpitations, dizziness or syncope.  Pt denies neurological change such as new headache, facial or extremity weakness.  Pt denies polydipsia, polyuria, or low sugar symptoms. Pt states overall good compliance with treatment and medications, good tolerability, and has been trying to follow lower cholesterol diet.  Pt denies worsening depressive symptoms, suicidal ideation or panic. No fever, night sweats, wt loss, loss of appetite, or other constitutional symptoms.  Pt states good ability with ADL's, has low fall risk, home safety reviewed and adequate, no other significant changes in hearing or vision, and only occasionally active with exercise.  Last seen 2011. Due for card f/u as well. Needs to re-start most meds.  C/o left hearing loss/chronic as well as ongoing bilat finger/hand djd pains. Works full time, takes care of invalid wife after hours. 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 Wt somewhat increased a few lbs. No tobacco for 12 yrs.  Past Medical History  Diagnosis Date  . Hyperlipidemia   . Peripheral vascular disease 2005    s/p left fem-pop Dr.Lawson  . Biceps tendon rupture     h/o Right biceps tendon rupture  . Osteoarthritis   . CAD (coronary artery disease)     ETT myoview (10/11) with EF 52%, reversible inferior perfusion defect, 1 mm inferolateral ST depression with exercise, 7"16" and stopped due to dyspnea and leg weakness. LHC (10/11), EF 55%, 40% pRCA, 40% mRCA, 40% prox to mid. CFX. medical treatment.   Past Surgical History  Procedure Laterality Date  . Ds/p left femoral artery occlusion  2005    s/p left fem-pop bypass- Dr. Hart Rochester  . S/p right bicep tendon rupture repair      reports that he has quit smoking. He  does not have any smokeless tobacco history on file. He reports that he does not drink alcohol or use illicit drugs. family history includes Cancer in his father and mother; Cervical cancer in his mother; and Lung cancer in his father. No Known Allergies Current Outpatient Prescriptions on File Prior to Visit  Medication Sig Dispense Refill  . aspirin 81 MG tablet Take 81 mg by mouth 2 (two) times daily.         No current facility-administered medications on file prior to visit.    Review of Systems Constitutional: Negative for diaphoresis, activity change, appetite change or unexpected weight change.  HENT: Negative for hearing loss, ear pain, facial swelling, mouth sores and neck stiffness.   Eyes: Negative for pain, redness and visual disturbance.  Respiratory: Negative for shortness of breath and wheezing.   Cardiovascular: Negative for chest pain and palpitations.  Gastrointestinal: Negative for diarrhea, blood in stool, abdominal distention or other pain Genitourinary: Negative for hematuria, flank pain or change in urine volume.  Musculoskeletal: Negative for myalgias and joint swelling.  Skin: Negative for color change and wound.  Neurological: Negative for syncope and numbness. other than noted Hematological: Negative for adenopathy.  Psychiatric/Behavioral: Negative for hallucinations, self-injury, decreased concentration and agitation.      Objective:   Physical Exam BP 122/80  Pulse 61  Temp(Src) 97 F (36.1 C) (Oral)  Ht 5\' 5"  (1.651 m)  Wt 143 lb 6 oz (65.034 kg)  BMI 23.86  kg/m2  SpO2 95% VS noted,  Constitutional: Pt is oriented to person, place, and time. Appears well-developed and well-nourished.  Head: Normocephalic and atraumatic.  Right Ear: External ear normal.  Left Ear: External ear normal.  Nose: Nose normal.  Mouth/Throat: Oropharynx is clear and moist.  Eyes: Conjunctivae and EOM are normal. Pupils are equal, round, and reactive to light.  Neck:  Normal range of motion. Neck supple. No JVD present. No tracheal deviation present.  Cardiovascular: Normal rate, regular rhythm, normal heart sounds and intact distal pulses.   Pulmonary/Chest: Effort normal and breath sounds normal.  Abdominal: Soft. Bowel sounds are normal. There is no tenderness. No HSM  Musculoskeletal: Normal range of motion. Exhibits no edema.  Lymphadenopathy:  Has no cervical adenopathy.  Neurological: Pt is alert and oriented to person, place, and time. Pt has normal reflexes. No cranial nerve deficit. Except marked left hearing loss (no wax), motor 5/5 Skin: Skin is warm and dry. No rash noted.  Bilat hand PIP and DIP arthritic changes noted Psychiatric:  Has  normal mood and affect. Behavior is normal.     Assessment & Plan:

## 2013-06-18 ENCOUNTER — Ambulatory Visit (INDEPENDENT_AMBULATORY_CARE_PROVIDER_SITE_OTHER)
Admission: RE | Admit: 2013-06-18 | Discharge: 2013-06-18 | Disposition: A | Discharge status: Home / Self Care | Payer: BC Managed Care – PPO | Source: Ambulatory Visit | Attending: Internal Medicine | Admitting: Internal Medicine

## 2013-06-18 ENCOUNTER — Ambulatory Visit (INDEPENDENT_AMBULATORY_CARE_PROVIDER_SITE_OTHER): Payer: BC Managed Care – PPO | Admitting: Internal Medicine

## 2013-06-18 ENCOUNTER — Encounter: Payer: Self-pay | Admitting: Internal Medicine

## 2013-06-18 VITALS — BP 112/72 | HR 62 | Temp 97.6°F | Ht 65.0 in | Wt 135.5 lb

## 2013-06-18 DIAGNOSIS — R7302 Impaired glucose tolerance (oral): Secondary | ICD-10-CM

## 2013-06-18 DIAGNOSIS — R7309 Other abnormal glucose: Secondary | ICD-10-CM

## 2013-06-18 DIAGNOSIS — E785 Hyperlipidemia, unspecified: Secondary | ICD-10-CM

## 2013-06-18 DIAGNOSIS — M79609 Pain in unspecified limb: Secondary | ICD-10-CM

## 2013-06-18 DIAGNOSIS — M79604 Pain in right leg: Secondary | ICD-10-CM | POA: Insufficient documentation

## 2013-06-18 MED ORDER — HYDROCODONE-ACETAMINOPHEN 7.5-325 MG PO TABS: 1.0000 | ORAL_TABLET | Freq: Four times a day (QID) | ORAL | Status: DC | PRN

## 2013-06-18 NOTE — Assessment & Plan Note (Signed)
Etiology unclear, recent wt loss is concerning, for right leg film, as well as RLE venous doppler - r/o dvt., but also for pain med, and refer Dr Hart Rochester as due for f/u at this time anyway; also consider ortho

## 2013-06-18 NOTE — Progress Notes (Signed)
Subjective:    Patient ID: Willie Kennedy, male    DOB: 1951-09-23, 62 y.o.   MRN: [id number]  HPI  Here with 3 wks onset (had minor pain x 3 mo) mod to severe pain, seems to start mid right leg, has intermittent soreness, worse to touch but no swelling, red, trauma, fever.  Worse to lift the leg, better to put the leg back on the floor.  No knee or above the knee. When occurs its located primarily to ankle, involving the leg below the knees, also with "feeling like bees" stinging the toes and numbness to the distal foot.  Limps to walk, but no weakness.  Has also lost 8 lbs in past month.    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  No tobacco for 12 days. Denies worsening reflux, abd pain, dysphagia, n/v, bowel change or blood.  Denies urinary symptoms such as dysuria, frequency, urgency, flank pain, hematuria or n/v, fever, chills.  Also with Does have several wks ongoing nasal allergy symptoms with clearish congestion, itch and sneezing, without fever, pain, ST, cough, swelling or wheezing. Has hx of claudication with LLE arteial bypass per Dr Lovie Macadamia surgury Past Medical History  Diagnosis Date  . Hyperlipidemia   . Peripheral vascular disease 2005    s/p left fem-pop Dr.Lawson  . Biceps tendon rupture     h/o Right biceps tendon rupture  . Osteoarthritis   . CAD (coronary artery disease)     ETT myoview (10/11) with EF 52%, reversible inferior perfusion defect, 1 mm inferolateral ST depression with exercise, 7"16" and stopped due to dyspnea and leg weakness. LHC (10/11), EF 55%, 40% pRCA, 40% mRCA, 40% prox to mid. CFX. medical treatment.   Past Surgical History  Procedure Laterality Date  . Ds/p left femoral artery occlusion  2005    s/p left fem-pop bypass- Dr. Hart Rochester  . S/p right bicep tendon rupture repair      reports that he has quit smoking. He does not have any smokeless tobacco history on file. He reports that he does not drink alcohol or use illicit  drugs. family history includes Cancer in his father and mother; Cervical cancer in his mother; and Lung cancer in his father. No Known Allergies Current Outpatient Prescriptions on File Prior to Visit  Medication Sig Dispense Refill  . aspirin 81 MG tablet Take 81 mg by mouth 2 (two) times daily.        Marland Kitchen atorvastatin (LIPITOR) 20 MG tablet Take 1 tablet (20 mg total) by mouth daily.  90 tablet  3  . lisinopril (PRINIVIL,ZESTRIL) 2.5 MG tablet Take 1 tablet (2.5 mg total) by mouth daily.  90 tablet  3  . nitroGLYCERIN (NITROSTAT) 0.4 MG SL tablet Place 1 tablet (0.4 mg total) under the tongue every 5 (five) minutes as needed. Place 1 tablet under tongue for chest pain -- not to exceed 3 in 15 minutes  100 tablet  11  . traMADol (ULTRAM) 50 MG tablet Take 1 tablet (50 mg total) by mouth every 6 (six) hours as needed.  60 tablet  2   No current facility-administered medications on file prior to visit.    Review of Systems  Constitutional: Negative for unexpected weight change, or unusual diaphoresis  HENT: Negative for tinnitus.   Eyes: Negative for photophobia and visual disturbance.  Respiratory: Negative for choking and stridor.   Gastrointestinal: Negative for vomiting and blood in stool.  Genitourinary: Negative for hematuria and decreased  urine volume.  Musculoskeletal: Negative for acute joint swelling Skin: Negative for color change and wound.  Neurological: Negative for tremors and numbness other than noted  Psychiatric/Behavioral: Negative for decreased concentration or  hyperactivity.       Objective:   Physical Exam BP 112/72  Pulse 62  Temp(Src) 97.6 F (36.4 C) (Oral)  Ht 5\' 5"  (1.651 m)  Wt 135 lb 8 oz (61.462 kg)  BMI 22.55 kg/m2  SpO2 94% VS noted,  Constitutional: Pt appears well-developed and well-nourished.  HENT: Head: NCAT.  Right Ear: External ear normal.  Left Ear: External ear normal.  Eyes: Conjunctivae and EOM are normal. Pupils are equal, round, and  reactive to light.  Neck: Normal range of motion. Neck supple.  Cardiovascular: Normal rate and regular rhythm.   Pulmonary/Chest: Effort normal and breath sounds normal.  Abd:  Soft, NT, non-distended, + BS Right knee and ankle NT, no effusion, FROM Right mid leg with nondiscrete mid/medial swelling/tender, ? Slightly warm but no actual mass, fluctuance, calf nontender today Neurological: Pt is alert. Not confused , motor 5/5, sens/dtr intact Skin: Skin is warm. No erythema. Dorsalis pedis 1+ bilat, toes warm bilat, pink, normal cappillary refill Psychiatric: Pt behavior is normal. Thought content normal.     Assessment & Plan:

## 2013-06-18 NOTE — Assessment & Plan Note (Signed)
stable overall by history and exam, recent data reviewed with pt, and pt to continue medical treatment as before,  to f/u any worsening symptoms or concerns Lab Results  Component Value Date   WBC 6.9 04/27/2013   HGB 15.5 04/27/2013   HCT 45.6 04/27/2013   PLT 260.0 04/27/2013   GLUCOSE 99 04/27/2013   CHOL 185 04/27/2013   TRIG 52.0 04/27/2013   HDL 44.00 04/27/2013   LDLCALC 131* 04/27/2013   ALT 27 04/27/2013   AST 24 04/27/2013   NA 139 04/27/2013   K 4.5 04/27/2013   CL 105 04/27/2013   CREATININE 1.0 04/27/2013   BUN 19 04/27/2013   CO2 30 04/27/2013   TSH 4.49 04/27/2013   PSA 0.62 04/27/2013   INR 0.9 ratio 09/15/2010

## 2013-06-18 NOTE — Patient Instructions (Addendum)
Please take all new medication as prescribed - the pain medication You will be contacted regarding the referral for: Right leg venous doppler ultrasound -- to see [facility] now You will be contacted regarding the referral for: Dr Sena Slate surgury Please go to the XRAY Department in the Basement (go straight as you get off the elevator) for the x-ray testing You will be contacted by phone if any changes need to be made immediately.  Otherwise, you will receive a letter about your results with an explanation, but please check with MyChart first.  Please remember to sign up for My Chart if you have not done so, as this will be important to you in the future with finding out test results, communicating by private email, and scheduling acute appointments online when needed.

## 2013-06-18 NOTE — Assessment & Plan Note (Addendum)
stable overall by history and exam, recent data reviewed with pt, and pt to continue medical treatment as before,  to f/u any worsening symptoms or concerns Lab Results  Component Value Date   LDLCALC 131* 04/27/2013   Declines further tx at this time, for lower chol diet

## 2013-06-19 ENCOUNTER — Encounter (INDEPENDENT_AMBULATORY_CARE_PROVIDER_SITE_OTHER): Payer: BC Managed Care – PPO

## 2013-06-19 DIAGNOSIS — M79604 Pain in right leg: Secondary | ICD-10-CM

## 2013-06-19 DIAGNOSIS — M79609 Pain in unspecified limb: Secondary | ICD-10-CM

## 2013-06-19 DIAGNOSIS — R0602 Shortness of breath: Secondary | ICD-10-CM

## 2013-07-20 ENCOUNTER — Telehealth: Payer: Self-pay | Admitting: *Deleted

## 2013-07-20 NOTE — Telephone Encounter (Signed)
Called no answer on cell or home number.

## 2013-07-20 NOTE — Telephone Encounter (Signed)
Pt's wife called and lvm stating that there was a mess up on Willie Kennedy's appt  With Willie Kennedy and they are unable to get him in until Sept 10th. Pt still has a lot of pain in his feet and back. She wants to know if Willie Kennedy needs to see him since he is unable to get in until Sept. 10th. She is concerned. Call back # [phone].

## 2013-07-20 NOTE — Telephone Encounter (Signed)
Ok to make ROV at their convenience, or consdier sat clinic if pain too severe

## 2013-07-20 NOTE — Telephone Encounter (Signed)
Called cell and home number no answer and no VM to leave message on either number.

## 2013-07-23 ENCOUNTER — Telehealth: Payer: Self-pay | Admitting: *Deleted

## 2013-07-23 ENCOUNTER — Ambulatory Visit: Payer: BC Managed Care – PPO | Admitting: Cardiovascular Disease

## 2013-07-23 NOTE — Telephone Encounter (Signed)
Called informed the patients wife of MD instructions.  She agreed once she spoke to her husband to call back and schedule ROV

## 2013-07-23 NOTE — Telephone Encounter (Signed)
Called left msg to call back. 

## 2013-07-23 NOTE — Telephone Encounter (Signed)
Pt returned called.  Advised of Dr Melvyn Novas message.  Pt further states pain is better.

## 2013-07-27 ENCOUNTER — Ambulatory Visit (INDEPENDENT_AMBULATORY_CARE_PROVIDER_SITE_OTHER): Payer: BC Managed Care – PPO | Admitting: Cardiology

## 2013-07-27 ENCOUNTER — Encounter: Payer: Self-pay | Admitting: Cardiology

## 2013-07-27 ENCOUNTER — Other Ambulatory Visit: Payer: Self-pay | Admitting: *Deleted

## 2013-07-27 VITALS — BP 115/71 | HR 69 | Ht 65.0 in | Wt 134.8 lb

## 2013-07-27 DIAGNOSIS — Z48812 Encounter for surgical aftercare following surgery on the circulatory system: Secondary | ICD-10-CM

## 2013-07-27 DIAGNOSIS — I739 Peripheral vascular disease, unspecified: Secondary | ICD-10-CM

## 2013-07-27 DIAGNOSIS — R079 Chest pain, unspecified: Secondary | ICD-10-CM

## 2013-07-27 DIAGNOSIS — E785 Hyperlipidemia, unspecified: Secondary | ICD-10-CM

## 2013-07-27 NOTE — Assessment & Plan Note (Signed)
Continue statin. 

## 2013-07-27 NOTE — Assessment & Plan Note (Signed)
Continue aspirin and statin. He is scheduled to followup with vascular surgery.

## 2013-07-27 NOTE — Assessment & Plan Note (Signed)
Symptoms are atypical. They have been intermittent since age 62. Previous catheterization showed nonobstructive coronary disease. Would not pursue further ischemia evaluation at this time.

## 2013-07-27 NOTE — Patient Instructions (Addendum)
Your physician wants you to follow-up in: ONE YEAR WITH DR CRENSHAW You will receive a reminder letter in the mail two months in advance. If you don't receive a letter, please call our office to schedule the follow-up appointment.  

## 2013-07-27 NOTE — Progress Notes (Signed)
HPI: 62 year old male for followup of coronary artery disease. Patient has had previous left fem-pop in 2005. He had a nuclear study in October of 2011 and showed an ejection fraction of 52% and a reversible inferior defect. Cardiac catheterization in October of 2011 showed an ejection fraction of 55%, 40% right coronary artery proximally and 40% mid. 50 proximal to mid circumflex. Medical therapy recommended. He has not been seen since that time. He has dyspnea with more extreme activities but not routine activities. No orthopnea, PND or pedal edema. He has occasional pain in the left wrist area. It is described as a sharp pain. No radiation or associated symptoms. It lasted 2 minutes and resolve spontaneously. It is not exertional or pleuritic. He has had this intermittently since age 56. He has not had recent syncope.   Current Outpatient Prescriptions  Medication Sig Dispense Refill  . aspirin 81 MG tablet Take 81 mg by mouth 2 (two) times daily.        Marland Kitchen atorvastatin (LIPITOR) 20 MG tablet Take 1 tablet (20 mg total) by mouth daily.  90 tablet  3  . HYDROcodone-acetaminophen (NORCO) 7.5-325 MG per tablet Take 1 tablet by mouth every 6 (six) hours as needed for pain.  60 tablet  0  . lisinopril (PRINIVIL,ZESTRIL) 2.5 MG tablet Take 1 tablet (2.5 mg total) by mouth daily.  90 tablet  3  . nitroGLYCERIN (NITROSTAT) 0.4 MG SL tablet Place 1 tablet (0.4 mg total) under the tongue every 5 (five) minutes as needed. Place 1 tablet under tongue for chest pain -- not to exceed 3 in 15 minutes  100 tablet  11  . traMADol (ULTRAM) 50 MG tablet Take 1 tablet (50 mg total) by mouth every 6 (six) hours as needed.  60 tablet  2   No current facility-administered medications for this visit.     Past Medical History  Diagnosis Date  . Hyperlipidemia   . Peripheral vascular disease 2005    s/p left fem-pop Dr.Lawson  . Biceps tendon rupture     h/o Right biceps tendon rupture  . Osteoarthritis   . CAD  (coronary artery disease)     ETT myoview (10/11) with EF 52%, reversible inferior perfusion defect, 1 mm inferolateral ST depression with exercise, 7"16" and stopped due to dyspnea and leg weakness. LHC (10/11), EF 55%, 40% pRCA, 40% mRCA, 40% prox to mid. CFX. medical treatment.    Past Surgical History  Procedure Laterality Date  . Ds/p left femoral artery occlusion  2005    s/p left fem-pop bypass- Dr. Hart Rochester  . S/p right bicep tendon rupture repair      History   Social History  . Marital Status: Married    Spouse Name: N/A    Number of Children: 1  . Years of Education: N/A   Occupational History  . Master woood craftsman    Social History Main Topics  . Smoking status: Former Games developer  . Smokeless tobacco: Not on file     Comment: quit in 2001  . Alcohol Use: No  . Drug Use: No  . Sexual Activity: Not on file   Other Topics Concern  . Not on file   Social History Narrative  . No narrative on file    ROS: some pain in left lower extremity since previous vascular surgery but no fevers or chills, productive cough, hemoptysis, dysphasia, odynophagia, melena, hematochezia, dysuria, hematuria, rash, seizure activity, orthopnea, PND, pedal edema, claudication. Remaining systems are negative.  Physical Exam: Well-developed well-nourished in no acute distress.  Skin is warm and dry.  HEENT is normal.  Neck is supple.  Chest is clear to auscultation with normal expansion.  Cardiovascular exam is regular rate and rhythm.  Abdominal exam nontender or distended. No masses palpated. Extremities show no edema. 1+ posterior tibial pulse on the left. neuro grossly intact  ECG 05/04/13-Sinus rhythm, nonspecific ST changes.

## 2013-07-27 NOTE — Assessment & Plan Note (Signed)
Continue aspirin and statin. 

## 2013-08-04 ENCOUNTER — Other Ambulatory Visit: Payer: Self-pay | Admitting: Internal Medicine

## 2013-08-06 NOTE — Telephone Encounter (Signed)
MD out pls advise on refill...lmb 

## 2013-08-06 NOTE — Telephone Encounter (Signed)
Faxed script back to [facility].../lmb 

## 2013-08-07 ENCOUNTER — Encounter: Payer: Self-pay | Admitting: Vascular Surgery

## 2013-08-08 ENCOUNTER — Encounter (INDEPENDENT_AMBULATORY_CARE_PROVIDER_SITE_OTHER): Payer: BC Managed Care – PPO | Admitting: *Deleted

## 2013-08-08 ENCOUNTER — Encounter: Payer: Self-pay | Admitting: Vascular Surgery

## 2013-08-08 ENCOUNTER — Other Ambulatory Visit: Payer: Self-pay | Admitting: Internal Medicine

## 2013-08-08 ENCOUNTER — Other Ambulatory Visit: Payer: Self-pay | Admitting: *Deleted

## 2013-08-08 ENCOUNTER — Ambulatory Visit (INDEPENDENT_AMBULATORY_CARE_PROVIDER_SITE_OTHER): Payer: BC Managed Care – PPO | Admitting: Vascular Surgery

## 2013-08-08 VITALS — BP 123/71 | HR 82 | Resp 16 | Ht 66.0 in | Wt 134.0 lb

## 2013-08-08 DIAGNOSIS — I739 Peripheral vascular disease, unspecified: Secondary | ICD-10-CM

## 2013-08-08 DIAGNOSIS — Z48812 Encounter for surgical aftercare following surgery on the circulatory system: Secondary | ICD-10-CM

## 2013-08-08 NOTE — Progress Notes (Signed)
[facility] and [facility]in Specialist of [facility]  Patient name: Willie Kennedy MRN: [id number] DOB: Apr 14, 1951 Sex: male  REASON FOR CONSULT: leg pain  HPI: Willie Kennedy is a 62 y.o. male who underwent a left femoropopliteal bypass graft by Dr. Hart Rochester 2007. He was lost to follow up. He states that since that operation he has had some pain in the lower part of his left leg. This is described as a burning pain. I do not get any history of claudication or rest pain.  The pain had been increasing recently and therefore he comes in for evaluation. He states his symptoms are worse when he sitting at night after he comes home from work. He does not experience the symptoms as much when he is standing during the day. He quit smoking about 15 years ago. He is unaware of any history of DVT in the past.  Past Medical History  Diagnosis Date  . Hyperlipidemia   . Peripheral vascular disease 2005    s/p left fem-pop Dr.Lawson  . Biceps tendon rupture     h/o Right biceps tendon rupture  . Osteoarthritis   . CAD (coronary artery disease)     ETT myoview (10/11) with EF 52%, reversible inferior perfusion defect, 1 mm inferolateral ST depression with exercise, 7"16" and stopped due to dyspnea and leg weakness. LHC (10/11), EF 55%, 40% pRCA, 40% mRCA, 40% prox to mid. CFX. medical treatment.   Family History  Problem Relation Age of Onset  . Cervical cancer Mother   . Cancer Mother     cervical  . Lung cancer Father   . Cancer Father     lung   SOCIAL HISTORY: History  Substance Use Topics  . Smoking status: Former Smoker    Types: Cigarettes    Quit date: 08/08/2000  . Smokeless tobacco: Never Used     Comment: quit in 2001  . Alcohol Use: No    No Known Allergies  Current Outpatient Prescriptions  Medication Sig Dispense Refill  . aspirin 81 MG tablet Take 81 mg by mouth 2 (two) times daily.        Marland Kitchen atorvastatin (LIPITOR) 20 MG tablet Take 1 tablet (20 mg total) by mouth daily.  90 tablet  3   . lisinopril (PRINIVIL,ZESTRIL) 2.5 MG tablet Take 1 tablet (2.5 mg total) by mouth daily.  90 tablet  3  . nitroGLYCERIN (NITROSTAT) 0.4 MG SL tablet Place 1 tablet (0.4 mg total) under the tongue every 5 (five) minutes as needed. Place 1 tablet under tongue for chest pain -- not to exceed 3 in 15 minutes  100 tablet  11  . traMADol (ULTRAM) 50 MG tablet Take 1 tablet (50 mg total) by mouth every 6 (six) hours as needed.  60 tablet  2  . HYDROcodone-acetaminophen (NORCO) 7.5-325 MG per tablet TAKE ONE TABLET BY MOUTH EVERY 6 HOURS AS NEEDED FOR PAIN  60 tablet  0   No current facility-administered medications for this visit.   REVIEW OF SYSTEMS: Arly.Keller ] denotes positive finding; [  ] denotes negative finding  CARDIOVASCULAR:  Arly.Keller ] chest pain   [ ]  chest pressure   Arly.Keller ] palpitations   [ ]  orthopnea   Arly.Keller ] dyspnea on exertion   [ ]  claudication   [ ]  rest pain   [ ]  DVT   [ ]  phlebitis PULMONARY:   [ ]  productive cough   [ ]  asthma   [ ]  wheezing NEUROLOGIC:   [ ]   weakness  [ ]  paresthesias  [ ]  aphasia  [ ]  amaurosis  Arly.Keller ] dizziness HEMATOLOGIC:   [ ]  bleeding problems   [ ]  clotting disorders MUSCULOSKELETAL:  [ ]  joint pain   [ ]  joint swelling Arly.Keller ] leg swelling GASTROINTESTINAL: [ ]   blood in stool  [ ]   hematemesis GENITOURINARY:  [ ]   dysuria  [ ]   hematuria PSYCHIATRIC:  [ ]  history of major depression INTEGUMENTARY:  [ ]  rashes  [ ]  ulcers CONSTITUTIONAL:  [ ]  fever   [ ]  chills  PHYSICAL EXAM: Filed Vitals:   08/08/13 1301  BP: 123/71  Pulse: 82  Resp: 16  Height: 5\' 6"  (1.676 m)  Weight: 134 lb (60.782 kg)   Body mass index is 21.64 kg/(m^2). GENERAL: The patient is a well-nourished male, in no acute distress. The vital signs are documented above. CARDIOVASCULAR: There is a regular rate and rhythm. Do not detect carotid bruits. He has palpable femoral pulses and palpable dorsalis pedis pulses bilaterally. He has no significant lower extremity swelling. PULMONARY: There is good  air exchange bilaterally without wheezing or rales. ABDOMEN: Soft and non-tender with normal pitched bowel sounds.  MUSCULOSKELETAL: There are no major deformities or cyanosis. NEUROLOGIC: No focal weakness or paresthesias are detected. SKIN: There are no ulcers or rashes noted. He has prominent varicose veins of the left lower extremity. PSYCHIATRIC: The patient has a normal affect.  DATA:  I have independently interpreted his graft duplex which shows that his femoropopliteal bypass graft is widely patent with no areas of significantly increased callosity. The eyes 100% bilaterally with triphasic Doppler signals in the dorsalis pedis and posterior tibial positions bilaterally.  MEDICAL ISSUES: This patient has a patent left femoropopliteal bypass graft. I think his pain is related to neuropathy. He does have evidence of venous insufficiency on the left, however he does not experience these symptoms when he is simply standing. Therefore I do not think his symptoms can be attributed to his venous disease. Regardless, we have discussed the importance of intermittent leg elevation. I've ordered follow up ABIs in one year and I'll see him back at that time. He knows to call sooner if he has problems.  DICKSON,CHRISTOPHER S [facility] [place] [facility]: [phone]

## 2013-08-08 NOTE — Telephone Encounter (Signed)
Tramadol denies, just had vicodin sept 8 rx

## 2013-08-22 ENCOUNTER — Other Ambulatory Visit: Payer: Self-pay | Admitting: Internal Medicine

## 2013-08-22 NOTE — Telephone Encounter (Signed)
Done hardcopy to robin  

## 2013-08-23 NOTE — Telephone Encounter (Signed)
Faxed hardcopy to [facility]. [address] [place]

## 2013-12-08 ENCOUNTER — Other Ambulatory Visit: Payer: Self-pay | Admitting: Internal Medicine

## 2013-12-10 NOTE — Telephone Encounter (Signed)
Ok to refill? Last OV 7.21.14. Last filled 9.24.14

## 2013-12-10 NOTE — Telephone Encounter (Signed)
Done hardcopy to robin  

## 2013-12-11 NOTE — Telephone Encounter (Signed)
Faxed hardcopy to [facility]

## 2014-01-10 ENCOUNTER — Encounter: Payer: Self-pay | Admitting: Internal Medicine

## 2014-01-10 ENCOUNTER — Ambulatory Visit (INDEPENDENT_AMBULATORY_CARE_PROVIDER_SITE_OTHER): Payer: Worker's Compensation | Admitting: Internal Medicine

## 2014-01-10 VITALS — BP 120/80 | HR 67 | Temp 98.2°F | Ht 66.0 in | Wt 131.4 lb

## 2014-01-10 DIAGNOSIS — M25521 Pain in right elbow: Secondary | ICD-10-CM | POA: Insufficient documentation

## 2014-01-10 DIAGNOSIS — M25529 Pain in unspecified elbow: Secondary | ICD-10-CM

## 2014-01-10 MED ORDER — TRAMADOL HCL 50 MG PO TABS: ORAL_TABLET | ORAL | Status: DC

## 2014-01-10 NOTE — Progress Notes (Signed)
Subjective:    Patient ID: Willie Kennedy, male    DOB: [birth year], 63 y.o.   MRN: [id number]  HPI  Here to fu with c/o right elbow pain, persistent moderate still after 1 wk after falling to right elbow and right hip from 5 ft off a ladder when lost balance, on concrete. Pt denies chest pain, increased sob or doe, wheezing, orthopnea, PND, increased LE swelling, palpitations, dizziness or syncope.  Pt denies new neurological symptoms such as new headache, or facial or extremity weakness or numbness   Pt denies polydipsia, polyuria.  Using arms quite a bit each day to care for seriously ill wife who has fallen several times recently. Asks for tramadol refill.  Not able to exercise nearly as much recently with lifting wts due to time needed to care for wife. Past Medical History  Diagnosis Date  . Hyperlipidemia   . Peripheral vascular disease 2005    s/p left fem-pop Dr.Lawson  . Biceps tendon rupture     h/o Right biceps tendon rupture  . Osteoarthritis   . CAD (coronary artery disease)     ETT myoview (10/11) with EF 52%, reversible inferior perfusion defect, 1 mm inferolateral ST depression with exercise, 7"16" and stopped due to dyspnea and leg weakness. LHC (10/11), EF 55%, 40% pRCA, 40% mRCA, 40% prox to mid. CFX. medical treatment.   Past Surgical History  Procedure Laterality Date  . Ds/p left femoral artery occlusion  2005    s/p left fem-pop bypass- Dr. Kellie Simmering  . S/p right bicep tendon rupture repair    . Pr vein bypass graft,aorto-fem-pop      reports that he quit smoking about 13 years ago. His smoking use included Cigarettes. He smoked 0.00 packs per day. He has never used smokeless tobacco. He reports that he does not drink alcohol or use illicit drugs. family history includes Cancer in his father and mother; Cervical cancer in his mother; Lung cancer in his father. No Known Allergies Current Outpatient Prescriptions on File Prior to Visit  Medication Sig Dispense Refill  .  aspirin 81 MG tablet Take 81 mg by mouth 2 (two) times daily.        Marland Kitchen atorvastatin (LIPITOR) 20 MG tablet Take 1 tablet (20 mg total) by mouth daily.  90 tablet  3  . lisinopril (PRINIVIL,ZESTRIL) 2.5 MG tablet Take 1 tablet (2.5 mg total) by mouth daily.  90 tablet  3  . nitroGLYCERIN (NITROSTAT) 0.4 MG SL tablet Place 1 tablet (0.4 mg total) under the tongue every 5 (five) minutes as needed. Place 1 tablet under tongue for chest pain -- not to exceed 3 in 15 minutes  100 tablet  11   No current facility-administered medications on file prior to visit.   Review of Systems  All otherwise neg per pt     Objective:   Physical Exam BP 120/80  Pulse 67  Temp(Src) 98.2 F (36.8 C) (Oral)  Ht 5\' 6"  (1.676 m)  Wt 131 lb 6 oz (59.591 kg)  BMI 21.21 kg/m2  SpO2 94% VS noted,  Constitutional: Pt appears well-developed and well-nourished.  HENT: Head: NCAT.  Right Ear: External ear normal.  Left Ear: External ear normal.  Eyes: Conjunctivae and EOM are normal. Pupils are equal, round, and reactive to light.  Neck: Normal range of motion. Neck supple.  Cardiovascular: Normal rate and regular rhythm.   Pulmonary/Chest: Effort normal and breath sounds normal.  Right elbow mild swelling, slight decreased ROM,  tender over lateral epicondlar surface, o/w neurovasc intact Neurological: Pt is alert. Not confused  Skin: Skin is warm. No erythema.  Psychiatric: Pt behavior is normal. Thought content normal.      Assessment & Plan:

## 2014-01-10 NOTE — Patient Instructions (Addendum)
Please take all new medication as prescribed Please continue all other medications as before, and refills have been done if requested. Please have the pharmacy call with any other refills you may need.  Please return about May 2015 for your yearly exam

## 2014-01-10 NOTE — Progress Notes (Signed)
Pre-visit discussion using our clinic review tool. No additional management support is needed unless otherwise documented below in the visit note.  

## 2014-01-10 NOTE — Assessment & Plan Note (Signed)
Doubt fx, but cant r/o completely, for pain med refill, right elbow film

## 2014-01-11 ENCOUNTER — Ambulatory Visit (INDEPENDENT_AMBULATORY_CARE_PROVIDER_SITE_OTHER)
Admission: RE | Admit: 2014-01-11 | Discharge: 2014-01-11 | Disposition: A | Discharge status: Home / Self Care | Payer: Worker's Compensation | Source: Ambulatory Visit | Attending: Internal Medicine | Admitting: Internal Medicine

## 2014-01-11 ENCOUNTER — Encounter: Payer: Self-pay | Admitting: Internal Medicine

## 2014-01-11 DIAGNOSIS — M25529 Pain in unspecified elbow: Secondary | ICD-10-CM

## 2014-01-11 DIAGNOSIS — M25521 Pain in right elbow: Secondary | ICD-10-CM

## 2014-01-16 ENCOUNTER — Telehealth: Payer: Self-pay | Admitting: *Deleted

## 2014-01-16 NOTE — Telephone Encounter (Signed)
Letter was sent but must be slow in mail due to weather  Xray neg for fx or other acute problem

## 2014-01-16 NOTE — Telephone Encounter (Signed)
Called the patient informed of xray results.

## 2014-01-16 NOTE — Telephone Encounter (Signed)
Patient phoned requesting results for elbow xrays.  Please advise.  CB# [phone]

## 2014-02-04 ENCOUNTER — Encounter: Payer: Self-pay | Admitting: Internal Medicine

## 2014-02-21 ENCOUNTER — Telehealth: Payer: Self-pay

## 2014-02-21 NOTE — Telephone Encounter (Signed)
Robin see below

## 2014-02-21 NOTE — Telephone Encounter (Signed)
The patient called and is hoping to get a refill of his pain medicine.  He states he has not slept in several days due to his severe leg pain.   Callback - [phone]

## 2014-02-21 NOTE — Telephone Encounter (Signed)
Needs OV, as last visit pain medication was done I think for his right elbow pain, meant to be temporary, and Dr Scot Dock though his leg pain was possibly nerve related (not circulatory)

## 2014-02-22 ENCOUNTER — Telehealth: Payer: Self-pay

## 2014-02-22 ENCOUNTER — Ambulatory Visit: Payer: Worker's Compensation | Admitting: Internal Medicine

## 2014-02-22 NOTE — Telephone Encounter (Signed)
Phone call to pt has been advised needs office visit. Was transferred to scheduling desk to make an appointment.

## 2014-02-22 NOTE — Telephone Encounter (Signed)
Patient called and stated he is canceling his appt for this afternoon stating that he picked up his pain medicine from [facility].

## 2014-02-22 NOTE — Telephone Encounter (Signed)
Left msg to call back.

## 2014-03-27 ENCOUNTER — Telehealth: Payer: Self-pay | Admitting: Internal Medicine

## 2014-03-27 NOTE — Telephone Encounter (Signed)
Called back to give them the direct number for the billing department. Pt said he would call.

## 2014-03-27 NOTE — Telephone Encounter (Signed)
Had a question as to why she received a bill when it is a Chief Financial Officer comp case.

## 2014-03-27 NOTE — Telephone Encounter (Signed)
Called and was able to comfirm that we had the workmen comp information. Will get with billing department.

## 2014-03-31 ENCOUNTER — Other Ambulatory Visit: Payer: Self-pay | Admitting: Internal Medicine

## 2014-04-01 NOTE — Telephone Encounter (Signed)
Printed rx md sign fax back to [facility]...[name]

## 2014-04-01 NOTE — Telephone Encounter (Signed)
OK X1 

## 2014-04-01 NOTE — Telephone Encounter (Signed)
PLS ADVISE ON REFILL...Willie Kennedy

## 2014-05-02 ENCOUNTER — Telehealth: Payer: Self-pay | Admitting: Internal Medicine

## 2014-05-02 DIAGNOSIS — S4990XA Unspecified injury of shoulder and upper arm, unspecified arm, initial encounter: Secondary | ICD-10-CM

## 2014-05-02 NOTE — Telephone Encounter (Signed)
Done per emr 

## 2014-05-02 NOTE — Telephone Encounter (Signed)
Pt was seen for a fell and arm injury.  He is still having a lot of elbow pain.  His workman's comp case worker wants him to see a bone specialist.  The case number is [phone].  He wants to go to [facility].

## 2014-05-03 NOTE — Telephone Encounter (Signed)
Pt is aware that [facility] will call him.

## 2014-05-28 ENCOUNTER — Other Ambulatory Visit: Payer: Self-pay

## 2014-05-28 MED ORDER — TRAMADOL HCL 50 MG PO TABS: ORAL_TABLET | ORAL | Status: DC

## 2014-05-28 NOTE — Telephone Encounter (Signed)
Faxed hardcopy to [place]. [place]

## 2014-05-28 NOTE — Telephone Encounter (Signed)
Done hardcopy to robin  

## 2014-07-08 ENCOUNTER — Encounter: Payer: Self-pay | Admitting: Gastroenterology

## 2014-08-02 ENCOUNTER — Ambulatory Visit: Payer: Self-pay | Admitting: Cardiology

## 2014-08-06 ENCOUNTER — Encounter: Payer: Self-pay | Admitting: Vascular Surgery

## 2014-08-07 ENCOUNTER — Ambulatory Visit (HOSPITAL_COMMUNITY)
Admission: RE | Admit: 2014-08-07 | Discharge: 2014-08-07 | Disposition: A | Discharge status: Home / Self Care | Payer: Medicare Other | Source: Ambulatory Visit | Attending: Vascular Surgery | Admitting: Vascular Surgery

## 2014-08-07 ENCOUNTER — Ambulatory Visit (INDEPENDENT_AMBULATORY_CARE_PROVIDER_SITE_OTHER): Payer: Medicare Other | Admitting: Vascular Surgery

## 2014-08-07 ENCOUNTER — Encounter: Payer: Self-pay | Admitting: Vascular Surgery

## 2014-08-07 VITALS — BP 147/76 | HR 62 | Temp 98.3°F | Resp 16 | Ht 66.0 in | Wt 136.0 lb

## 2014-08-07 DIAGNOSIS — I2510 Atherosclerotic heart disease of native coronary artery without angina pectoris: Secondary | ICD-10-CM | POA: Insufficient documentation

## 2014-08-07 DIAGNOSIS — Z48812 Encounter for surgical aftercare following surgery on the circulatory system: Secondary | ICD-10-CM | POA: Insufficient documentation

## 2014-08-07 DIAGNOSIS — Z9889 Other specified postprocedural states: Secondary | ICD-10-CM | POA: Insufficient documentation

## 2014-08-07 DIAGNOSIS — Z87891 Personal history of nicotine dependence: Secondary | ICD-10-CM | POA: Insufficient documentation

## 2014-08-07 DIAGNOSIS — Z7982 Long term (current) use of aspirin: Secondary | ICD-10-CM | POA: Diagnosis not present

## 2014-08-07 DIAGNOSIS — I739 Peripheral vascular disease, unspecified: Secondary | ICD-10-CM | POA: Diagnosis not present

## 2014-08-07 DIAGNOSIS — E785 Hyperlipidemia, unspecified: Secondary | ICD-10-CM | POA: Diagnosis not present

## 2014-08-07 NOTE — Assessment & Plan Note (Signed)
The patient's left femoropopliteal bypass graft is functioning well he has normal pulses and normal Doppler signals in both feet. He describes pain in the left leg which sounds like neuropathic pain. He does have evidence of chronic venous insufficiency however I do not think his symptoms are related to this as elevation does not help the symptoms. In addition he states that standing makes the pain better. I did reassure him that he has no evidence of arterial insufficiency his bypass graft is working well. I've ordered a follow up duplex of his graft in 1 year and ABIs at that time. He knows to call sooner for has problems.

## 2014-08-07 NOTE — Progress Notes (Signed)
Vascular and Vein Specialist of [facility]  Patient name: Willie Kennedy MRN: [id number] DOB: [birth year] Sex: male  REASON FOR VISIT: Follow up of peripheral vascular disease.  HPI: Willie Kennedy is a 63 y.o. male who underwent a left femoral popliteal bypass in 2007. He was then lost to follow. I saw him in consultation in one year ago. At that time he was describing burning pain in his left leg but I did not get any history of claudication or rest pain. At the time of his last visit, graft duplex showed his femoropopliteal bypass graft was widely patent with no areas of concern. ABIs were 100% bilaterally with triphasic Doppler signals. He did have some evidence of venous insufficiency on the left however I thought his symptoms were likely related to neuropathy and not chronic venous insufficiency. He comes in for a one year follow up visit.  Since I saw him last, he continues to have some pain in his left leg which occurs when he is sitting. He describes burning pain. Standing makes the pain better. He states that elevation and is not helped with these symptoms. He denies claudication or rest pain. He is not a smoker.  Past Medical History  Diagnosis Date  . Hyperlipidemia   . Peripheral vascular disease 2005    s/p left fem-pop Dr.Lawson  . Biceps tendon rupture     h/o Right biceps tendon rupture  . Osteoarthritis   . CAD (coronary artery disease)     ETT myoview (10/11) with EF 52%, reversible inferior perfusion defect, 1 mm inferolateral ST depression with exercise, 7"16" and stopped due to dyspnea and leg weakness. LHC (10/11), EF 55%, 40% pRCA, 40% mRCA, 40% prox to mid. CFX. medical treatment.   Family History  Problem Relation Age of Onset  . Cervical cancer Mother   . Cancer Mother     cervical  . Lung cancer Father   . Cancer Father     lung   SOCIAL HISTORY: History  Substance Use Topics  . Smoking status: Former Smoker    Types: Cigarettes    Quit date: 08/08/2000    . Smokeless tobacco: Never Used     Comment: quit in 2001  . Alcohol Use: No   No Known Allergies Current Outpatient Prescriptions  Medication Sig Dispense Refill  . aspirin 81 MG tablet Take 81 mg by mouth 2 (two) times daily.        Marland Kitchen lisinopril (PRINIVIL,ZESTRIL) 2.5 MG tablet Take 1 tablet (2.5 mg total) by mouth daily.  90 tablet  3  . nitroGLYCERIN (NITROSTAT) 0.4 MG SL tablet Place 1 tablet (0.4 mg total) under the tongue every 5 (five) minutes as needed. Place 1 tablet under tongue for chest pain -- not to exceed 3 in 15 minutes  100 tablet  11  . traMADol (ULTRAM) 50 MG tablet TAKE ONE TABLET BY MOUTH EVERY 6 HOURS AS NEEDED  60 tablet  1  . atorvastatin (LIPITOR) 20 MG tablet Take 1 tablet (20 mg total) by mouth daily.  90 tablet  3   No current facility-administered medications for this visit.   REVIEW OF SYSTEMS: Valu.Nieves ] denotes positive finding; [  ] denotes negative finding  CARDIOVASCULAR:  [ ]  chest pain   [ ]  chest pressure   [ ]  palpitations   [ ]  orthopnea   [ ]  dyspnea on exertion   [ ]  claudication   [ ]  rest pain   [ ]  DVT   [ ]   phlebitis PULMONARY:   [ ]  productive cough   [ ]  asthma   [ ]  wheezing NEUROLOGIC:   [ ]  weakness  [ ]  paresthesias  [ ]  aphasia  [ ]  amaurosis  [ ]  dizziness HEMATOLOGIC:   [ ]  bleeding problems   [ ]  clotting disorders MUSCULOSKELETAL:  [ ]  joint pain   [ ]  joint swelling [ ]  leg swelling GASTROINTESTINAL: [ ]   blood in stool  [ ]   hematemesis GENITOURINARY:  [ ]   dysuria  [ ]   hematuria PSYCHIATRIC:  [ ]  history of major depression INTEGUMENTARY:  [ ]  rashes  [ ]  ulcers CONSTITUTIONAL:  [ ]  fever   [ ]  chills  PHYSICAL EXAM: Filed Vitals:   08/07/14 1426  BP: 147/76  Pulse: 62  Temp: 98.3 F (36.8 C)  TempSrc: Oral  Resp: 16  Height: 5\' 6"  (1.676 m)  Weight: 136 lb (61.689 kg)  SpO2: 100%   Body mass index is 21.96 kg/(m^2). GENERAL: The patient is a well-nourished male, in no acute distress. The vital signs are documented  above. CARDIOVASCULAR: There is a regular rate and rhythm. I do not detect carotid bruits. He has palpable femoral, popliteal, and posterior tibial pulses bilaterally. He has significant varicose veins bilaterally but no significant lower extremity swelling. PULMONARY: There is good air exchange bilaterally without wheezing or rales. ABDOMEN: Soft and non-tender with normal pitched bowel sounds.  MUSCULOSKELETAL: There are no major deformities or cyanosis. NEUROLOGIC: No focal weakness or paresthesias are detected. SKIN: There are no ulcers or rashes noted. PSYCHIATRIC: The patient has a normal affect.  DATA:  ARTERIAL DOPPLER STUDY: I have independently interpreted his arterial Doppler study. This shows triphasic Doppler signals in the dorsalis pedis and posterior tibial positions bilaterally. ABI on the right is 100%. ABI on the left is 100%. Toe pressures are 100 mmHg bilaterally.  MEDICAL ISSUES:  PERIPHERAL VASCULAR DISEASE The patient's left femoropopliteal bypass graft is functioning well he has normal pulses and normal Doppler signals in both feet. He describes pain in the left leg which sounds like neuropathic pain. He does have evidence of chronic venous insufficiency however I do not think his symptoms are related to this as elevation does not help the symptoms. In addition he states that standing makes the pain better. I did reassure him that he has no evidence of arterial insufficiency his bypass graft is working well. I've ordered a follow up duplex of his graft in 1 year and ABIs at that time. He knows to call sooner for has problems.    Return in about 1 year (around 08/08/2015).  [place] [facility] [place] [facility]: [phone]

## 2014-08-14 ENCOUNTER — Ambulatory Visit: Payer: Medicare Other | Admitting: Vascular Surgery

## 2014-08-14 ENCOUNTER — Ambulatory Visit: Payer: BC Managed Care – PPO | Admitting: Vascular Surgery

## 2014-08-14 ENCOUNTER — Encounter (HOSPITAL_COMMUNITY): Payer: BC Managed Care – PPO

## 2014-08-19 MED ORDER — GABAPENTIN 300 MG PO CAPS: 300.0000 mg | ORAL_CAPSULE | Freq: Three times a day (TID) | ORAL | Status: DC

## 2014-08-19 NOTE — Addendum Note (Signed)
Addended by: Mena Goes on: 08/19/2014 06:50 PM   Modules accepted: Orders

## 2014-09-05 ENCOUNTER — Ambulatory Visit: Payer: Self-pay | Admitting: Cardiology

## 2014-09-06 ENCOUNTER — Other Ambulatory Visit: Payer: Self-pay | Admitting: Internal Medicine

## 2014-09-06 NOTE — Telephone Encounter (Signed)
Done hardcopy to robin  

## 2014-09-06 NOTE — Telephone Encounter (Signed)
Faxed hardcopy for Tramadol to [facility]. [address]

## 2014-09-19 NOTE — Progress Notes (Signed)
HPI: FU coronary artery disease. Patient has had previous left fem-pop in 2005. PVD followed by vascular surgery. He had a nuclear study in October of 2011 and showed an ejection fraction of 52% and a reversible inferior defect. Cardiac catheterization in October of 2011 showed an ejection fraction of 55%, 40% right coronary artery proximally and 40% mid. 50 proximal to mid circumflex. Medical therapy recommended. Since last seen, He notes dyspnea with moderate activities but not routine activities. No orthopnea or PND. He notes dizziness with standing. He continues to have occasional brief pain under his left breast for 15 seconds to 1 minute. This has been present since he was 63 years old. Not exertional.   Current Outpatient Prescriptions  Medication Sig Dispense Refill  . aspirin 81 MG tablet Take 81 mg by mouth 2 (two) times daily.        Marland Kitchen gabapentin (NEURONTIN) 300 MG capsule Take 1 capsule (300 mg total) by mouth 3 (three) times daily.  90 capsule  10  . lisinopril (PRINIVIL,ZESTRIL) 2.5 MG tablet Take 1 tablet (2.5 mg total) by mouth daily.  90 tablet  3  . nitroGLYCERIN (NITROSTAT) 0.4 MG SL tablet Place 1 tablet (0.4 mg total) under the tongue every 5 (five) minutes as needed. Place 1 tablet under tongue for chest pain -- not to exceed 3 in 15 minutes  100 tablet  11  . traMADol (ULTRAM) 50 MG tablet TAKE ONE TABLET BY MOUTH EVERY 6 HOURS AS NEEDED  60 tablet  2  . atorvastatin (LIPITOR) 20 MG tablet Take 1 tablet (20 mg total) by mouth daily.  90 tablet  3   No current facility-administered medications for this visit.     Past Medical History  Diagnosis Date  . Hyperlipidemia   . Peripheral vascular disease 2005    s/p left fem-pop Dr.Lawson  . Biceps tendon rupture     h/o Right biceps tendon rupture  . Osteoarthritis   . CAD (coronary artery disease)     ETT myoview (10/11) with EF 52%, reversible inferior perfusion defect, 1 mm inferolateral ST depression with  exercise, 7"16" and stopped due to dyspnea and leg weakness. LHC (10/11), EF 55%, 40% pRCA, 40% mRCA, 40% prox to mid. CFX. medical treatment.    Past Surgical History  Procedure Laterality Date  . Ds/p left femoral artery occlusion  2005    s/p left fem-pop bypass- Dr. Kellie Simmering  . S/p right bicep tendon rupture repair    . Pr vein bypass graft,aorto-fem-pop      History   Social History  . Marital Status: Married    Spouse Name: N/A    Number of Children: 1  . Years of Education: N/A   Occupational History  . Master woood craftsman    Social History Main Topics  . Smoking status: Former Smoker    Types: Cigarettes    Quit date: 08/08/2000  . Smokeless tobacco: Never Used     Comment: quit in 2001  . Alcohol Use: No  . Drug Use: No  . Sexual Activity: Not on file   Other Topics Concern  . Not on file   Social History Narrative  . No narrative on file    ROS: no fevers or chills, productive cough, hemoptysis, dysphasia, odynophagia, melena, hematochezia, dysuria, hematuria, rash, seizure activity, orthopnea, PND, pedal edema, claudication. Remaining systems are negative.  Physical Exam: Well-developed well-nourished in no acute distress.  Skin is warm and dry.  HEENT  is normal.  Neck is supple.  Chest is clear to auscultation with normal expansion.  Cardiovascular exam is regular rate and rhythm.  Abdominal exam nontender or distended. No masses palpated. Positive bruit Extremities show no edema. neuro grossly intact  ECG Sinus bradycardia at a rate of 59. Nonspecific inferior lateral T-wave inversion.

## 2014-09-20 ENCOUNTER — Encounter: Payer: Self-pay | Admitting: *Deleted

## 2014-09-20 ENCOUNTER — Encounter: Payer: Self-pay | Admitting: Cardiology

## 2014-09-20 ENCOUNTER — Ambulatory Visit (INDEPENDENT_AMBULATORY_CARE_PROVIDER_SITE_OTHER): Payer: Medicare Other | Admitting: Cardiology

## 2014-09-20 VITALS — BP 110/64 | HR 59 | Ht 65.5 in | Wt 137.5 lb

## 2014-09-20 DIAGNOSIS — R0989 Other specified symptoms and signs involving the circulatory and respiratory systems: Secondary | ICD-10-CM

## 2014-09-20 DIAGNOSIS — R072 Precordial pain: Secondary | ICD-10-CM | POA: Diagnosis not present

## 2014-09-20 DIAGNOSIS — I2510 Atherosclerotic heart disease of native coronary artery without angina pectoris: Secondary | ICD-10-CM

## 2014-09-20 DIAGNOSIS — R079 Chest pain, unspecified: Secondary | ICD-10-CM | POA: Insufficient documentation

## 2014-09-20 LAB — LIPID PANEL
Cholesterol: 180 mg/dL (ref 0–200)
HDL: 42 mg/dL (ref >39)
LDL Cholesterol: 122 mg/dL — ABNORMAL HIGH (ref 0–99)
Total CHOL/HDL Ratio: 4.3 Ratio
Triglycerides: 80 mg/dL (ref <150)
VLDL: 16 mg/dL (ref 0–40)

## 2014-09-20 LAB — HEPATIC FUNCTION PANEL
ALT: 14 U/L (ref 0–53)
AST: 20 U/L (ref 0–37)
Albumin: 4.1 g/dL (ref 3.5–5.2)
Alkaline Phosphatase: 83 U/L (ref 39–117)
Bilirubin, Direct: 0.1 mg/dL (ref 0.0–0.3)
Indirect Bilirubin: 0.7 mg/dL (ref 0.2–1.2)
Total Bilirubin: 0.8 mg/dL (ref 0.2–1.2)
Total Protein: 6.5 g/dL (ref 6.0–8.3)

## 2014-09-20 NOTE — Assessment & Plan Note (Signed)
Continue aspirin and statin. Followed by vascular surgery. 

## 2014-09-20 NOTE — Assessment & Plan Note (Signed)
Abdominal ultrasound to exclude aneurysm. 

## 2014-09-20 NOTE — Assessment & Plan Note (Addendum)
Continue aspirin and statin. He is having some orthostatic symptoms. Discontinue lisinopril. I have asked him to increase hydration as well.

## 2014-09-20 NOTE — Patient Instructions (Signed)
Your physician wants you to follow-up in: [place] will receive a reminder letter in the mail two months in advance. If you don't receive a letter, please call our office to schedule the follow-up appointment.   Your physician recommends that you HAVE LAB WORK TODAY  STOP LISINOPRIL  Your physician has requested that you have an abdominal aorta duplex. During this test, an ultrasound is used to evaluate the aorta. Allow 30 minutes for this exam. Do not eat after midnight the day before and avoid carbonated beverages

## 2014-09-20 NOTE — Assessment & Plan Note (Signed)
Continue statin. Check lipids and liver. 

## 2014-09-20 NOTE — Assessment & Plan Note (Signed)
Symptoms are chronic, unchanged and atypical. Electrocardiogram unchanged. No further ischemia evaluation.

## 2014-09-26 ENCOUNTER — Ambulatory Visit: Payer: Self-pay | Admitting: Cardiology

## 2014-09-27 ENCOUNTER — Telehealth: Payer: Self-pay | Admitting: Cardiology

## 2014-09-27 DIAGNOSIS — E785 Hyperlipidemia, unspecified: Secondary | ICD-10-CM

## 2014-09-27 MED ORDER — ATORVASTATIN CALCIUM 80 MG PO TABS: 80.0000 mg | ORAL_TABLET | Freq: Every day | ORAL | Status: DC

## 2014-09-27 NOTE — Telephone Encounter (Signed)
Returning your call. °

## 2014-09-27 NOTE — Telephone Encounter (Signed)
Spoke with pt, Patient voiced understanding of increase in lipitor. Lab orders placed

## 2014-10-04 ENCOUNTER — Ambulatory Visit (HOSPITAL_COMMUNITY)
Admission: RE | Admit: 2014-10-04 | Discharge: 2014-10-04 | Disposition: A | Discharge status: Home / Self Care | Payer: Medicare Other | Source: Ambulatory Visit | Attending: Cardiology | Admitting: Cardiology

## 2014-10-04 DIAGNOSIS — R011 Cardiac murmur, unspecified: Secondary | ICD-10-CM | POA: Insufficient documentation

## 2014-10-04 DIAGNOSIS — R0989 Other specified symptoms and signs involving the circulatory and respiratory systems: Secondary | ICD-10-CM | POA: Diagnosis not present

## 2014-10-04 NOTE — Progress Notes (Signed)
Abdominal Aortic Duplex Completed °Brianna L Mazza,RVT °

## 2014-10-08 ENCOUNTER — Encounter (HOSPITAL_COMMUNITY): Payer: Self-pay | Admitting: *Deleted

## 2014-10-08 NOTE — Telephone Encounter (Signed)
pts wife left a message wanting aorta doppler results

## 2014-10-09 NOTE — Telephone Encounter (Signed)
This encounter was created in error - please disregard.

## 2014-10-18 ENCOUNTER — Encounter: Payer: Self-pay | Admitting: Internal Medicine

## 2014-11-01 ENCOUNTER — Other Ambulatory Visit: Payer: Self-pay

## 2014-11-01 MED ORDER — TRAMADOL HCL 50 MG PO TABS: 50.0000 mg | ORAL_TABLET | Freq: Four times a day (QID) | ORAL | Status: DC | PRN

## 2014-11-01 NOTE — Telephone Encounter (Signed)
Done hardcopy to robin  

## 2014-11-01 NOTE — Telephone Encounter (Signed)
Rx faxed to [facility] [phone].

## 2014-11-04 ENCOUNTER — Other Ambulatory Visit: Payer: Self-pay

## 2014-11-04 DIAGNOSIS — E785 Hyperlipidemia, unspecified: Secondary | ICD-10-CM

## 2014-11-04 MED ORDER — ATORVASTATIN CALCIUM 80 MG PO TABS: 80.0000 mg | ORAL_TABLET | Freq: Every day | ORAL | Status: DC

## 2014-11-07 DIAGNOSIS — I779 Disorder of arteries and arterioles, unspecified: Secondary | ICD-10-CM | POA: Diagnosis not present

## 2014-11-07 DIAGNOSIS — L853 Xerosis cutis: Secondary | ICD-10-CM | POA: Diagnosis not present

## 2014-11-07 DIAGNOSIS — L84 Corns and callosities: Secondary | ICD-10-CM | POA: Diagnosis not present

## 2014-11-14 ENCOUNTER — Other Ambulatory Visit: Payer: Self-pay | Admitting: *Deleted

## 2014-11-14 DIAGNOSIS — I739 Peripheral vascular disease, unspecified: Secondary | ICD-10-CM

## 2014-11-14 MED ORDER — GABAPENTIN 300 MG PO CAPS: 300.0000 mg | ORAL_CAPSULE | Freq: Three times a day (TID) | ORAL | Status: DC

## 2014-12-20 ENCOUNTER — Ambulatory Visit: Payer: Self-pay

## 2014-12-27 ENCOUNTER — Ambulatory Visit (INDEPENDENT_AMBULATORY_CARE_PROVIDER_SITE_OTHER): Payer: Medicare Other

## 2014-12-27 DIAGNOSIS — Z23 Encounter for immunization: Secondary | ICD-10-CM | POA: Diagnosis not present

## 2015-01-29 ENCOUNTER — Telehealth: Payer: Self-pay | Admitting: Internal Medicine

## 2015-01-29 MED ORDER — TRAMADOL HCL 50 MG PO TABS: 50.0000 mg | ORAL_TABLET | Freq: Three times a day (TID) | ORAL | Status: DC | PRN

## 2015-01-29 NOTE — Telephone Encounter (Signed)
Pt called in said that he has run out of traMADol (ULTRAM) 50 MG tablet [[id number] wants to know if he can get 5 pills till his meds come in?    [facility] on [address]

## 2015-01-29 NOTE — Telephone Encounter (Signed)
Done hardcopy to Cherina  

## 2015-01-29 NOTE — Telephone Encounter (Signed)
Rx sent to pharmacy. Pt. Is aware.

## 2015-02-05 ENCOUNTER — Telehealth: Payer: Self-pay | Admitting: Internal Medicine

## 2015-02-05 MED ORDER — TRAMADOL HCL 50 MG PO TABS: 50.0000 mg | ORAL_TABLET | Freq: Four times a day (QID) | ORAL | Status: DC | PRN

## 2015-02-05 MED ORDER — TRAMADOL HCL 50 MG PO TABS: 50.0000 mg | ORAL_TABLET | Freq: Three times a day (TID) | ORAL | Status: DC | PRN

## 2015-02-05 NOTE — Telephone Encounter (Signed)
Done hardcopy to Cherina  

## 2015-05-01 ENCOUNTER — Other Ambulatory Visit: Payer: Self-pay | Admitting: Internal Medicine

## 2015-05-16 ENCOUNTER — Ambulatory Visit (INDEPENDENT_AMBULATORY_CARE_PROVIDER_SITE_OTHER): Payer: Medicare Other | Admitting: Internal Medicine

## 2015-05-16 ENCOUNTER — Encounter: Payer: Self-pay | Admitting: Internal Medicine

## 2015-05-16 VITALS — BP 118/66 | HR 70 | Temp 98.1°F | Ht 65.0 in | Wt 135.0 lb

## 2015-05-16 DIAGNOSIS — Z23 Encounter for immunization: Secondary | ICD-10-CM

## 2015-05-16 DIAGNOSIS — G4762 Sleep related leg cramps: Secondary | ICD-10-CM | POA: Insufficient documentation

## 2015-05-16 DIAGNOSIS — R079 Chest pain, unspecified: Secondary | ICD-10-CM

## 2015-05-16 DIAGNOSIS — R7302 Impaired glucose tolerance (oral): Secondary | ICD-10-CM

## 2015-05-16 DIAGNOSIS — N320 Bladder-neck obstruction: Secondary | ICD-10-CM

## 2015-05-16 DIAGNOSIS — E785 Hyperlipidemia, unspecified: Secondary | ICD-10-CM

## 2015-05-16 DIAGNOSIS — K439 Ventral hernia without obstruction or gangrene: Secondary | ICD-10-CM | POA: Diagnosis not present

## 2015-05-16 MED ORDER — ROSUVASTATIN CALCIUM 40 MG PO TABS: 40.0000 mg | ORAL_TABLET | Freq: Every day | ORAL | Status: DC

## 2015-05-16 MED ORDER — TIZANIDINE HCL 4 MG PO TABS: 4.0000 mg | ORAL_TABLET | Freq: Four times a day (QID) | ORAL | Status: DC | PRN

## 2015-05-16 NOTE — Assessment & Plan Note (Addendum)
With persistent LDL elevation despite high dose lipitor, for change to crestor 40 as now is generic, lower chol diet Lab Results  Component Value Date   LDLCALC 122* 09/20/2014  Note:  Total time for pt hx, exam, review of record with pt in the room, determination of diagnoses and plan for further eval and tx is > 40 min, with over 50% spent in coordination and counseling of patient

## 2015-05-16 NOTE — Assessment & Plan Note (Signed)
Asympt, stable overall by history and exam, recent data reviewed with pt, and pt to continue medical treatment as before,  to f/u any worsening symptoms or concerns No results found for: HGBA1C For f/u a1c
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
## 2015-05-16 NOTE — Assessment & Plan Note (Signed)
C/w msk spams due to overwork for his age most likely, does not likely need imaging or other eval at this time, ok for tizandine prn

## 2015-05-16 NOTE — Progress Notes (Signed)
Pre visit review using our clinic review tool, if applicable. No additional management support is needed unless otherwise documented below in the visit note. 

## 2015-05-16 NOTE — Assessment & Plan Note (Signed)
Also for tizanidine prn,  to f/u any worsening symptoms or concerns

## 2015-05-16 NOTE — Patient Instructions (Addendum)
You had the Td tetanus shot today, and the Prevnar pneumonia shot  OK to finish your current lipitor  Please take all new medication as prescribed - the crestor 40 mg  For the leg swelling, be sure to elevate your legs when sitting, and low salt diet  Please continue all other medications as before, and refills have been done if requested.  Please have the pharmacy call with any other refills you may need.  Please continue your efforts at being more active, low cholesterol diet, and weight control.  You are otherwise up to date with prevention measures today.  Please keep your appointments with your specialists as you may have planned

## 2015-05-16 NOTE — Addendum Note (Signed)
Addended by: Lyman Bishop on: 05/16/2015 03:41 PM   Modules accepted: Orders

## 2015-05-16 NOTE — Assessment & Plan Note (Signed)
Small, freq symptomatic, will need surgical repair - for referral Gen Surgury, will need cardiology clearance prior to surgury

## 2015-05-19 ENCOUNTER — Telehealth: Payer: Self-pay | Admitting: Internal Medicine

## 2015-05-19 NOTE — Telephone Encounter (Signed)
Patient states that he requested a refill of traMADol (ULTRAM) 50 MG tablet [[id number]] but didn't get the script. Requesting it be sent in. Verified pharmacy is correct- [facility] on [address].   Patient also states that rosuvastatin (CRESTOR) 40 MG tablet [[id number]] is far too expensive. He requests a generic alternative be sent in.

## 2015-05-20 NOTE — Telephone Encounter (Signed)
Dahlia to verify with pharmacy - I believe crestor just became approved, has it become available now at pharmacy?

## 2015-05-20 NOTE — Telephone Encounter (Signed)
Pt advised that Tramadol Rx was given at the time of the OV. Please advise on alternative medication to Crestor

## 2015-05-21 MED ORDER — ATORVASTATIN CALCIUM 80 MG PO TABS: 80.0000 mg | ORAL_TABLET | Freq: Every day | ORAL | Status: DC

## 2015-05-21 NOTE — Telephone Encounter (Signed)
Per pharmacy, pt's insurance does not "recognize" generic Crestor as it is still new. Pharmacist recommends switching to generic atorvastatin or lovastatin.

## 2015-05-21 NOTE — Telephone Encounter (Signed)
Ok for lipitor 80 qd - done erx

## 2015-05-26 ENCOUNTER — Telehealth: Payer: Self-pay | Admitting: Internal Medicine

## 2015-05-26 ENCOUNTER — Other Ambulatory Visit: Payer: Self-pay | Admitting: Internal Medicine

## 2015-05-26 NOTE — Telephone Encounter (Signed)
Patient stated that [facility] haven't received his prescription for Tramadol please re send.

## 2015-05-26 NOTE — Telephone Encounter (Signed)
Patient states he did not receive script for tramadol.  Pharmacy is suppose to be sending fax in regards.  Please follow up with patient at [phone].  Patient needs tramadol every 8 hours to be taken off med list but to leave tramadol every 6 hours.

## 2015-05-28 NOTE — Telephone Encounter (Signed)
Done hardcopy to Dahlia  

## 2015-05-28 NOTE — Telephone Encounter (Signed)
Pt came in the office stated that he does not have the written Rx for Tramadol, pt is here hoping to get another write up so he can take it to the drug store (he is out 4 days now). Please advise.  Phone #  [phone]

## 2015-05-28 NOTE — Telephone Encounter (Signed)
Rx faxed to pharmacy  

## 2015-05-29 NOTE — Telephone Encounter (Signed)
error 

## 2015-06-06 ENCOUNTER — Encounter: Payer: Self-pay | Admitting: General Surgery

## 2015-06-06 DIAGNOSIS — K439 Ventral hernia without obstruction or gangrene: Secondary | ICD-10-CM | POA: Diagnosis not present

## 2015-06-06 NOTE — Progress Notes (Signed)
Willie Kennedy 06/06/2015 1:30 PM Location: [facility] Patient #: [id number] DOB: 04-Sep-1951 Married / Language: English / Race: White Male History of Present Illness Willie Hollingshead MD; 06/06/2015 2:53 PM) Patient words: hernia.  The patient is a 64 year old male    Note:He is referred by Dr. Jenny Reichmann for further evaluation of a primary ventral hernia. He was a Airline pilot in the past. For 3 years, he's had some intermittent epigastric pain. For the past 2 years, he is noted a bulge in the mid epigastrium that is painful at times. He does do heavy lifting at work. No obstruction symptoms. This becomes symptomatic when he lifts very heavy items and when he overeats. No problems with straining to urinate or constipation.  Other Problems Willie Kennedy, CMA; 06/06/2015 1:30 PM) Chest pain Hypercholesterolemia Pulmonary Embolism / Blood Clot in Legs  Past Surgical History Willie Kennedy, CMA; 06/06/2015 1:30 PM) Bypass Surgery for Poor Blood Flow to Legs  Diagnostic Studies History Willie Kennedy, CMA; 06/06/2015 1:30 PM) Colonoscopy >10 years ago  Allergies Willie Kennedy, CMA; 06/06/2015 1:32 PM) No Known Drug Allergies 06/06/2015  Medication History (Willie Kennedy, CMA; 06/06/2015 1:32 PM) TraMADol HCl (50MG  Tablet, Oral) Active. Gabapentin (300MG  Capsule, Oral) Active. TiZANidine HCl (4MG  Tablet, Oral) Active. Atorvastatin Calcium (80MG  Tablet, Oral) Active. Medications Reconciled  Social History Willie Kennedy, CMA; 06/06/2015 1:30 PM) Caffeine use Carbonated beverages. Tobacco use Former smoker.  Family History Willie Kennedy, CMA; 06/06/2015 1:30 PM) Alcohol Abuse Father. Cervical Cancer Mother.     Review of Systems Willie Kennedy CMA; 06/06/2015 1:31 PM) General Not Present- Appetite Loss, Chills, Fatigue, Fever, Night Sweats, Weight Gain and Weight Loss. Skin Present- Dryness. Not Present- Change in Wart/Mole, Hives, Jaundice, New Lesions, Non-Healing Wounds,  Rash and Ulcer. HEENT Present- Hearing Loss. Not Present- Earache, Hoarseness, Nose Bleed, Oral Ulcers, Ringing in the Ears, Seasonal Allergies, Sinus Pain, Sore Throat, Visual Disturbances, Wears glasses/contact lenses and Yellow Eyes. Respiratory Not Present- Bloody sputum, Chronic Cough, Difficulty Breathing, Snoring and Wheezing. Breast Not Present- Breast Mass, Breast Pain, Nipple Discharge and Skin Changes. Cardiovascular Present- Chest Pain, Leg Cramps and Swelling of Extremities. Not Present- Difficulty Breathing Lying Down, Palpitations, Rapid Heart Rate and Shortness of Breath. Gastrointestinal Not Present- Abdominal Pain, Bloating, Bloody Stool, Change in Bowel Habits, Chronic diarrhea, Constipation, Difficulty Swallowing, Excessive gas, Gets full quickly at meals, Hemorrhoids, Indigestion, Nausea, Rectal Pain and Vomiting. Male Genitourinary Not Present- Blood in Urine, Change in Urinary Stream, Frequency, Impotence, Nocturia, Painful Urination, Urgency and Urine Leakage. Musculoskeletal Present- Joint Pain, Joint Stiffness, Muscle Pain, Muscle Weakness and Swelling of Extremities. Not Present- Back Pain. Neurological Present- Weakness. Not Present- Decreased Memory, Fainting, Headaches, Numbness, Seizures, Tingling, Tremor and Trouble walking. Psychiatric Not Present- Anxiety, Bipolar, Change in Sleep Pattern, Depression, Fearful and Frequent crying. Endocrine Present- Cold Intolerance. Not Present- Excessive Hunger, Hair Changes, Heat Intolerance, Hot flashes and New Diabetes. Hematology Present- Easy Bruising. Not Present- Excessive bleeding, Gland problems, HIV and Persistent Infections.  Vitals (Willie Kennedy CMA; 06/06/2015 1:31 PM) 06/06/2015 1:31 PM Weight: 133 lb Height: 65in Body Surface Area: 1.66 m Body Mass Index: 22.13 kg/m Temp.: 97.3F(Temporal)  Pulse: 65 (Regular)  BP: 124/74 (Sitting, Left Arm, Standard)     Physical Exam Willie Hollingshead MD; 06/06/2015  2:54 PM)  The physical exam findings are as follows: Note:General: WDWN in NAD. Pleasant and cooperative.  HEENT: [place]/AT, no facial masses  CV: RRR, no murmur, no JVD.  CHEST: Breath sounds equal  and clear. Respirations nonlabored.  ABDOMEN: Soft, nontender, nondistended, partially reducible epigastric midline mass that is tender to palpation.  MUSCULOSKELETAL: 1+ LE edema, venous stasis changes  NEUROLOGIC: Alert and oriented, answers questions appropriately, normal gait and station.  PSYCHIATRIC: Normal mood, affect , and behavior.    Assessment & Plan Willie Hollingshead MD; 06/06/2015 2:55 PM)  VENTRAL HERNIA WITHOUT OBSTRUCTION OR GANGRENE (553.20  K43.9) Impression: This is symptomatic. He does do heavy lifting at work. Doubt this is just a lipoma but there is a small possibility of this.  Plan: We discussed ventral hernia repair with mesh. I need to communicate with Dr. Stanford Breed, his cardiologist, prior to surgery to make sure he is not high risk for this. I have discussed the procedure, risks, and aftercare. Risks include but are not limited to bleeding, infection, wound healing problems, anesthesia, recurrence, accidental injury to intra-abdominal organs-such as intestine, liver, spleen, bladder, etc. We also discussed the rare complication of mesh rejection. All questions were answered.  Willie Confer, MD

## 2015-06-10 ENCOUNTER — Telehealth: Payer: Self-pay

## 2015-06-10 NOTE — Telephone Encounter (Signed)
Request for surgical clearance: 1. What type of surgery is being performed? ventral hernia repair 2. When is this surgery scheduled? pending 3. Are there any medications that need to be held prior to surgery and how long? Not requested by surgeon but, patient is on Aspirin 81 4. Name of physician performing surgery? Dr Jackolyn Confer at [facility] 5. What is the office phone and fax number? Phone [phone]  Fax [phone]

## 2015-06-10 NOTE — Telephone Encounter (Signed)
Needs PA office visit for preop eval and management Kirk Ruths

## 2015-06-11 ENCOUNTER — Telehealth: Payer: Self-pay | Admitting: Cardiology

## 2015-06-11 NOTE — Telephone Encounter (Signed)
Closed encounter °

## 2015-06-11 NOTE — Telephone Encounter (Signed)
Patient notified he needed an office visit prior to clearance for surgery. Patient stated I could relay message to wife regarding this.  Message routed to schedulers to set up appointment.

## 2015-06-25 ENCOUNTER — Telehealth: Payer: Self-pay | Admitting: Physician Assistant

## 2015-06-25 NOTE — Telephone Encounter (Signed)
Received records from [facility] for appointment on 06/27/15 with Melina Copa, PA.  Records given to [employer] (medical records) for [name]'s schedule on 06/27/15. lp

## 2015-06-26 ENCOUNTER — Encounter: Payer: Self-pay | Admitting: Physician Assistant

## 2015-06-26 NOTE — Progress Notes (Signed)
Cardiology Office Note Date:  06/27/2015  Patient ID:  Willie Kennedy, Willie Kennedy June 14, 1951, MRN [id number] PCP:  Willie Cower, MD  Cardiologist:  Willie Kennedy   Chief Complaint: pre-op evaluation  History of Present Illness: Willie Kennedy is a 64 y.o. male with history of nonobstructive CAD, PVD s/p previous left fem-pop in 2005 followed by vascular, HLD, impaired glucose tolerance, h/o longstanding brief atypical chest pains who presents for pre-op clearance. H/o nuc in 2011 showing EF 52% and reversible inferior defect. Subsequent cath 10/11 showed 40% prox RCA, 40% mid RCA, 50% prox-to-mid Cx, EF 55% with some basal inferior HK. Screening AAA duplex negative for aneurysm 09/2014. He recently saw general surgery with plan for ventral hernia repair. No recent labs available. Last LDL 122 in 08/2014 at which time Lipitor was titrated to 80mg  daily. His PCP had recommended to switch to Crestor earlier this year but it was too expensive.  He comes in today for pre-op clearance of repair of a small, frequently symptomatic ventral hernia. He used to body build. However, ever since his wife had a stroke, he no longer exercises. 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 He denies any recent chest pain. However, he does report that if he is lifting cabinets up a flight of stairs or walking up an inclined hill, he gets SOB and has to rest. This has been going on for the last year or so. No nausea, vomiting, bleeding, syncope, palpitations, orthopnea, PND, or diaphoresis.  Past Medical History  Diagnosis Date  . Hyperlipidemia   . Peripheral vascular disease 2005    a. s/p left fem-pop Dr.Lawson in 2005.  . Biceps tendon rupture     h/o Right biceps tendon rupture  . Osteoarthritis   . CAD (coronary artery disease)     a. ETT myoview (10/11) with EF 52%, reversible inferior perfusion defect, 1 mm inferolateral ST depression with exercise, 7"16" and stopped due to dyspnea and  leg weakness -> LHC (10/11), EF 55%, 40% pRCA, 40% mRCA, 40% prox to mid CFX. medical treatment.  . Impaired glucose tolerance     Past Surgical History  Procedure Laterality Date  . Ds/p left femoral artery occlusion  2005    s/p left fem-pop bypass- Dr. Kellie Kennedy  . S/p right bicep tendon rupture repair    . Pr vein bypass graft,aorto-fem-pop      Current Outpatient Prescriptions  Medication Sig Dispense Refill  . aspirin 81 MG tablet Take 81 mg by mouth 2 (two) times daily.      Marland Kitchen atorvastatin (LIPITOR) 80 MG tablet Take 1 tablet (80 mg total) by mouth daily. 90 tablet 3  . gabapentin (NEURONTIN) 300 MG capsule Take 1 capsule (300 mg total) by mouth 3 (three) times daily. 270 capsule 4  . nitroGLYCERIN (NITROSTAT) 0.4 MG SL tablet Place 1 tablet (0.4 mg total) under the tongue every 5 (five) minutes as needed. Place 1 tablet under tongue for chest pain -- not to exceed 3 in 15 minutes 100 tablet 11  . tiZANidine (ZANAFLEX) 4 MG tablet Take 1 tablet (4 mg total) by mouth every 6 (six) hours as needed for muscle spasms. 60 tablet 0  . traMADol (ULTRAM) 50 MG tablet Take 1 tablet (50 mg total) by mouth every 6 (six) hours as needed. With limit 60 per month 60 tablet 2   No current facility-administered medications for this visit.    Allergies:   Review of  patient's allergies indicates no known allergies.   Social History:  The patient  reports that he quit smoking about 14 years ago. His smoking use included Cigarettes. He has never used smokeless tobacco. He reports that he does not drink alcohol or use illicit drugs.   Family History:  The patient's family history includes Cancer in his father and mother; Cervical cancer in his mother; Lung cancer in his father.  ROS:  Please see the history of present illness.   All other systems are reviewed and otherwise negative.   PHYSICAL EXAM:  VS:  BP 110/68 mmHg  Pulse 63  Ht 5\' 5"  (1.651 m)  Wt 134 lb (60.782 kg)  BMI 22.30 kg/m2  SpO2  97% BMI: Body mass index is 22.3 kg/(m^2). Well nourished, well developed thin WM, in no acute distress HEENT: normocephalic, atraumatic Neck: no JVD, carotid bruits or masses Cardiac:  normal S1, S2; RRR; no murmurs, rubs, or gallops Lungs:  clear to auscultation bilaterally, no wheezing, rhonchi or rales Abd: soft, nontender, no hepatomegaly, + BS MS: no deformity or atrophy Ext: no edema Skin: warm and dry, no rash Neuro:  moves all extremities spontaneously, no focal abnormalities noted, follows commands Psych: euthymic mood, full affect   EKG:  Done today shows sinus bradycardia 56bpm, nonspecific ST-T changes, possible prior anterior infarct, no significant change from prior  Recent Labs: 09/20/2014: ALT 14  09/20/2014: Cholesterol 180; HDL 42; LDL Cholesterol 122*; Total CHOL/HDL Ratio 4.3; Triglycerides 80; VLDL 16   CrCl cannot be calculated (Patient has no serum creatinine result on file.).   Wt Readings from Last 3 Encounters:  06/27/15 134 lb (60.782 kg)  05/16/15 135 lb (61.236 kg)  09/20/14 137 lb 8 oz (62.37 kg)     Other studies reviewed: Additional studies/records reviewed today include: summarized above  ASSESSMENT AND PLAN:  1. Preop CV exam/dyspnea - the patient no longer exercises regularly. He does report that his functional capacity has declined in terms of dyspnea with moderate-heavy exertion. Will proceed with exercise nuclear stress test before clearing for surgery. He prefers this occur on a Friday due to work schedule. If this is abnormal he will require repeat cath. He denies any recent chest pain (but does have a history of rare fleeting "spasm" pain for the last 20 years). Continue aspirin and statin. He is not on BB due to baseline HR in the 50s. Will also check basic labs including CBC and TSH to exclude any other obvious causes for his dyspnea. He is a former tobacco user but has not smoked in a long time. 2. H/o nonobstructive CAD - see above. He  takes aspirin twice a day. Would consider decreasing to once daily if his nuc is normal. 3. Hyperlipidemia - he is overdue for recheck LFTs/lipids. He says he is fasting today so we will draw. 4. PVD - followed by [facility].  Disposition: F/u with Dr. Stanford Kennedy in 3 months. Cardiac clearance TBD based on nuclear stress test results.  Current medicines are reviewed at length with the patient today.  The patient did not have any concerns regarding medicines.  Raechel Ache PA-C 06/27/2015 12:05 PM     [facility] [address], [place], [place] [address] Phone: [phone]

## 2015-06-27 ENCOUNTER — Ambulatory Visit: Payer: Self-pay | Admitting: Physician Assistant

## 2015-06-27 ENCOUNTER — Ambulatory Visit (INDEPENDENT_AMBULATORY_CARE_PROVIDER_SITE_OTHER): Payer: Medicare Other | Admitting: Physician Assistant

## 2015-06-27 ENCOUNTER — Encounter: Payer: Self-pay | Admitting: Physician Assistant

## 2015-06-27 VITALS — BP 110/68 | HR 63 | Ht 65.0 in | Wt 134.0 lb

## 2015-06-27 DIAGNOSIS — I739 Peripheral vascular disease, unspecified: Secondary | ICD-10-CM | POA: Diagnosis not present

## 2015-06-27 DIAGNOSIS — Z79899 Other long term (current) drug therapy: Secondary | ICD-10-CM

## 2015-06-27 DIAGNOSIS — R0602 Shortness of breath: Secondary | ICD-10-CM | POA: Diagnosis not present

## 2015-06-27 DIAGNOSIS — I2510 Atherosclerotic heart disease of native coronary artery without angina pectoris: Secondary | ICD-10-CM | POA: Diagnosis not present

## 2015-06-27 DIAGNOSIS — E785 Hyperlipidemia, unspecified: Secondary | ICD-10-CM

## 2015-06-27 DIAGNOSIS — Z01810 Encounter for preprocedural cardiovascular examination: Secondary | ICD-10-CM

## 2015-06-27 LAB — CBC
HCT: 44 % (ref 39.0–52.0)
Hemoglobin: 14.9 g/dL (ref 13.0–17.0)
MCH: 31.4 pg (ref 26.0–34.0)
MCHC: 33.9 g/dL (ref 30.0–36.0)
MCV: 92.6 fL (ref 78.0–100.0)
MPV: 10.4 fL (ref 8.6–12.4)
Platelets: 265 10*3/uL (ref 150–400)
RBC: 4.75 MIL/uL (ref 4.22–5.81)
RDW: 14.2 % (ref 11.5–15.5)
WBC: 7.9 10*3/uL (ref 4.0–10.5)

## 2015-06-27 NOTE — Patient Instructions (Addendum)
Your physician has requested that you have en exercise stress myoview. For further information please visit [email or web address]. Please follow instruction sheet, as given.  Your physician recommends that you schedule a follow-up appointment in: 3 months with Dr. Stanford Breed.  Continue all medications as prescribed.  Your physician recommends that you return for lab work today.

## 2015-06-28 LAB — TSH: TSH: 2.447 u[IU]/mL (ref 0.350–4.500)

## 2015-06-28 LAB — COMPREHENSIVE METABOLIC PANEL
ALT: 18 U/L (ref 9–46)
AST: 25 U/L (ref 10–35)
Albumin: 4.3 g/dL (ref 3.6–5.1)
Alkaline Phosphatase: 91 U/L (ref 40–115)
BUN: 19 mg/dL (ref 7–25)
CO2: 30 mmol/L (ref 20–31)
Calcium: 9.2 mg/dL (ref 8.6–10.3)
Chloride: 100 mmol/L (ref 98–110)
Creat: 1 mg/dL (ref 0.70–1.25)
Glucose, Bld: 90 mg/dL (ref 65–99)
Potassium: 4.6 mmol/L (ref 3.5–5.3)
Sodium: 141 mmol/L (ref 135–146)
Total Bilirubin: 1 mg/dL (ref 0.2–1.2)
Total Protein: 6.9 g/dL (ref 6.1–8.1)

## 2015-06-28 LAB — LIPID PANEL
Cholesterol: 124 mg/dL — ABNORMAL LOW (ref 125–200)
HDL: 47 mg/dL (ref >=40)
LDL Cholesterol: 67 mg/dL (ref <130)
Total CHOL/HDL Ratio: 2.6 Ratio (ref <=5.0)
Triglycerides: 48 mg/dL (ref <150)
VLDL: 10 mg/dL (ref <30)

## 2015-07-04 ENCOUNTER — Ambulatory Visit (INDEPENDENT_AMBULATORY_CARE_PROVIDER_SITE_OTHER): Payer: Medicare Other | Admitting: Internal Medicine

## 2015-07-04 ENCOUNTER — Encounter: Payer: Self-pay | Admitting: Internal Medicine

## 2015-07-04 VITALS — BP 124/80 | HR 74 | Temp 98.6°F | Ht 65.0 in | Wt 134.0 lb

## 2015-07-04 DIAGNOSIS — H9193 Unspecified hearing loss, bilateral: Secondary | ICD-10-CM | POA: Diagnosis not present

## 2015-07-04 DIAGNOSIS — I2510 Atherosclerotic heart disease of native coronary artery without angina pectoris: Secondary | ICD-10-CM

## 2015-07-04 DIAGNOSIS — H6120 Impacted cerumen, unspecified ear: Secondary | ICD-10-CM | POA: Insufficient documentation

## 2015-07-04 DIAGNOSIS — H6123 Impacted cerumen, bilateral: Secondary | ICD-10-CM

## 2015-07-04 MED ORDER — TRAMADOL HCL 50 MG PO TABS: 50.0000 mg | ORAL_TABLET | Freq: Four times a day (QID) | ORAL | Status: DC | PRN

## 2015-07-04 NOTE — Progress Notes (Signed)
Subjective:    Patient ID: Willie Kennedy, male    DOB: 1951/04/03, 64 y.o.   MRN: [id number]  HPI  Here on referral per audiology who was concerned about wax impactions causing impairment of hearing testing, for removal if possible.  No fever, pain, sinus congestion, ST, cough and Pt denies chest pain, increased sob or doe, wheezing, orthopnea, PND, increased LE swelling, palpitations, dizziness or syncope.  Pt denies new neurological symptoms such as new headache, or facial or extremity weakness or numbness   Past Medical History  Diagnosis Date  . Hyperlipidemia   . Peripheral vascular disease 2005    a. s/p left fem-pop Dr.Lawson in 2005.  . Biceps tendon rupture     h/o Right biceps tendon rupture  . Osteoarthritis   . CAD (coronary artery disease)     a. ETT myoview (10/11) with EF 52%, reversible inferior perfusion defect, 1 mm inferolateral ST depression with exercise, 7"16" and stopped due to dyspnea and leg weakness -> LHC (10/11), EF 55%, 40% pRCA, 40% mRCA, 40% prox to mid CFX. medical treatment.  . Impaired glucose tolerance    Past Surgical History  Procedure Laterality Date  . Ds/p left femoral artery occlusion  2005    s/p left fem-pop bypass- Dr. Kellie Simmering  . S/p right bicep tendon rupture repair    . Pr vein bypass graft,aorto-fem-pop      reports that he quit smoking about 14 years ago. His smoking use included Cigarettes. He has never used smokeless tobacco. He reports that he does not drink alcohol or use illicit drugs. family history includes Cancer in his father and mother; Cervical cancer in his mother; Lung cancer in his father. No Known Allergies Current Outpatient Prescriptions on File Prior to Visit  Medication Sig Dispense Refill  . aspirin 81 MG tablet Take 81 mg by mouth 2 (two) times daily.      Marland Kitchen atorvastatin (LIPITOR) 80 MG tablet Take 1 tablet (80 mg total) by mouth daily. 90 tablet 3  . gabapentin (NEURONTIN) 300 MG capsule Take 1 capsule (300 mg  total) by mouth 3 (three) times daily. 270 capsule 4  . nitroGLYCERIN (NITROSTAT) 0.4 MG SL tablet Place 1 tablet (0.4 mg total) under the tongue every 5 (five) minutes as needed. Place 1 tablet under tongue for chest pain -- not to exceed 3 in 15 minutes 100 tablet 11  . tiZANidine (ZANAFLEX) 4 MG tablet Take 1 tablet (4 mg total) by mouth every 6 (six) hours as needed for muscle spasms. 60 tablet 0   No current facility-administered medications on file prior to visit.   Review of Systems All otherwise neg per pt     Objective:   Physical Exam BP 124/80 mmHg  Pulse 74  Temp(Src) 98.6 F (37 C) (Oral)  Ht 5\' 5"  (1.651 m)  Wt 134 lb (60.782 kg)  BMI 22.30 kg/m2  SpO2 95% VS noted,  Constitutional: Pt appears in no significant distress HENT: Head: NCAT.  Right Ear: External ear normal.  Left Ear: External ear normal.  Bilat small wax noted bilat, able to see at least 50% bilat TM's Staff attempted to irrigate but pt very sensitive and unable Eyes: . Pupils are equal, round, and reactive to light. Conjunctivae and EOM are normal Neck: Normal range of motion. Neck supple.  Cardiovascular: Normal rate and regular rhythm.   Pulmonary/Chest: Effort normal and breath sounds without rales or wheezing.  Neurological: Pt is alert. Not confused , motor  grossly intact Skin: Skin is warm. No rash, no LE edema Psychiatric: Pt behavior is normal. No agitation.     Assessment & Plan:

## 2015-07-04 NOTE — Patient Instructions (Signed)
We atempted to irrigate your ears today on both sides  You will be contacted regarding the referral for: ENT  Please continue all other medications as before, and refills have been done if requested - the tramadol  Please have the pharmacy call with any other refills you may need.  Please keep your appointments with your specialists as you may have planned

## 2015-07-04 NOTE — Progress Notes (Signed)
Pre visit review using our clinic review tool, if applicable. No additional management support is needed unless otherwise documented below in the visit note. 

## 2015-07-05 NOTE — Assessment & Plan Note (Signed)
Able to see some TM bilat, unable to remove wax by irrigation completely, pt for ENT referral, then should be able to see audiology

## 2015-07-05 NOTE — Assessment & Plan Note (Signed)
Suspect may not improve with clearing of wax, needs f/u audiology for likely hearing aids

## 2015-07-09 ENCOUNTER — Telehealth (HOSPITAL_COMMUNITY): Payer: Self-pay

## 2015-07-09 NOTE — Telephone Encounter (Signed)
Encounter complete. 

## 2015-07-10 ENCOUNTER — Telehealth (HOSPITAL_COMMUNITY): Payer: Self-pay

## 2015-07-10 NOTE — Telephone Encounter (Signed)
Encounter complete. 

## 2015-07-11 ENCOUNTER — Ambulatory Visit (HOSPITAL_COMMUNITY)
Admission: RE | Admit: 2015-07-11 | Discharge: 2015-07-11 | Disposition: A | Discharge status: Home / Self Care | Payer: Medicare Other | Source: Ambulatory Visit | Attending: Cardiovascular Disease | Admitting: Cardiovascular Disease

## 2015-07-11 DIAGNOSIS — R252 Cramp and spasm: Secondary | ICD-10-CM | POA: Diagnosis not present

## 2015-07-11 DIAGNOSIS — R42 Dizziness and giddiness: Secondary | ICD-10-CM | POA: Insufficient documentation

## 2015-07-11 DIAGNOSIS — R0602 Shortness of breath: Secondary | ICD-10-CM

## 2015-07-11 DIAGNOSIS — Z87891 Personal history of nicotine dependence: Secondary | ICD-10-CM | POA: Insufficient documentation

## 2015-07-11 DIAGNOSIS — Z01810 Encounter for preprocedural cardiovascular examination: Secondary | ICD-10-CM

## 2015-07-11 DIAGNOSIS — R5383 Other fatigue: Secondary | ICD-10-CM | POA: Diagnosis not present

## 2015-07-11 DIAGNOSIS — I2510 Atherosclerotic heart disease of native coronary artery without angina pectoris: Secondary | ICD-10-CM | POA: Diagnosis not present

## 2015-07-11 DIAGNOSIS — I739 Peripheral vascular disease, unspecified: Secondary | ICD-10-CM | POA: Insufficient documentation

## 2015-07-11 LAB — MYOCARDIAL PERFUSION IMAGING
Estimated workload: 7 METS
Exercise duration (min): 6 min
LV dias vol: 54 mL
LV sys vol: 54 mL
MPHR: 154 {beats}/min
Peak HR: 150 {beats}/min
Percent HR: 97 %
RPE: 17
Rest HR: 50 {beats}/min
SDS: 1
SRS: 1
SSS: 2
TID: 1.07

## 2015-07-11 MED ORDER — TECHNETIUM TC 99M SESTAMIBI GENERIC - CARDIOLITE
31.1000 | Freq: Once | INTRAVENOUS | Status: AC | PRN
  Administered 2015-07-11: 31.1 via INTRAVENOUS

## 2015-07-11 MED ORDER — TECHNETIUM TC 99M SESTAMIBI GENERIC - CARDIOLITE
10.9000 | Freq: Once | INTRAVENOUS | Status: AC | PRN
  Administered 2015-07-11: 10.9 via INTRAVENOUS

## 2015-07-14 ENCOUNTER — Telehealth: Payer: Self-pay | Admitting: *Deleted

## 2015-07-14 DIAGNOSIS — I2510 Atherosclerotic heart disease of native coronary artery without angina pectoris: Secondary | ICD-10-CM

## 2015-07-14 DIAGNOSIS — R9439 Abnormal result of other cardiovascular function study: Secondary | ICD-10-CM

## 2015-07-14 NOTE — Telephone Encounter (Signed)
Spoke with pt wife, aware of results. Follow up echo scheduled. Results forwarded to dr Zella Richer for clearance.

## 2015-07-14 NOTE — Telephone Encounter (Signed)
-----   Message from Lelon Perla, MD sent at 07/11/2015  3:53 PM EDT ----- Check echo for lv fx Ok for surgery Kirk Ruths

## 2015-07-14 NOTE — Telephone Encounter (Signed)
Left message for pt to call.

## 2015-07-17 ENCOUNTER — Encounter: Payer: Self-pay | Admitting: Cardiology

## 2015-07-17 ENCOUNTER — Other Ambulatory Visit: Payer: Self-pay

## 2015-07-17 ENCOUNTER — Ambulatory Visit (HOSPITAL_COMMUNITY): Payer: Medicare Other | Attending: Cardiology

## 2015-07-17 DIAGNOSIS — I2510 Atherosclerotic heart disease of native coronary artery without angina pectoris: Secondary | ICD-10-CM | POA: Diagnosis not present

## 2015-07-17 DIAGNOSIS — E785 Hyperlipidemia, unspecified: Secondary | ICD-10-CM | POA: Insufficient documentation

## 2015-07-17 DIAGNOSIS — Z87891 Personal history of nicotine dependence: Secondary | ICD-10-CM | POA: Insufficient documentation

## 2015-07-17 DIAGNOSIS — I739 Peripheral vascular disease, unspecified: Secondary | ICD-10-CM | POA: Diagnosis not present

## 2015-07-17 DIAGNOSIS — R9439 Abnormal result of other cardiovascular function study: Secondary | ICD-10-CM | POA: Diagnosis not present

## 2015-07-17 NOTE — Telephone Encounter (Signed)
Willie Kennedy returning a call for Willie Kennedy . Please call

## 2015-07-17 NOTE — Telephone Encounter (Signed)
This encounter was created in error - please disregard.

## 2015-07-18 ENCOUNTER — Other Ambulatory Visit (HOSPITAL_COMMUNITY): Payer: Self-pay

## 2015-07-21 ENCOUNTER — Other Ambulatory Visit: Payer: Self-pay | Admitting: General Surgery

## 2015-07-24 DIAGNOSIS — H6123 Impacted cerumen, bilateral: Secondary | ICD-10-CM | POA: Diagnosis not present

## 2015-07-24 DIAGNOSIS — H903 Sensorineural hearing loss, bilateral: Secondary | ICD-10-CM | POA: Diagnosis not present

## 2015-07-30 ENCOUNTER — Other Ambulatory Visit: Payer: Self-pay | Admitting: Internal Medicine

## 2015-08-11 ENCOUNTER — Other Ambulatory Visit: Payer: Self-pay | Admitting: Internal Medicine

## 2015-08-12 ENCOUNTER — Encounter: Payer: Self-pay | Admitting: Vascular Surgery

## 2015-08-13 ENCOUNTER — Ambulatory Visit (INDEPENDENT_AMBULATORY_CARE_PROVIDER_SITE_OTHER)
Admission: RE | Admit: 2015-08-13 | Discharge: 2015-08-13 | Disposition: A | Discharge status: Home / Self Care | Payer: Medicare Other | Source: Ambulatory Visit | Attending: Vascular Surgery | Admitting: Vascular Surgery

## 2015-08-13 ENCOUNTER — Ambulatory Visit (INDEPENDENT_AMBULATORY_CARE_PROVIDER_SITE_OTHER): Payer: Medicare Other | Admitting: Vascular Surgery

## 2015-08-13 ENCOUNTER — Other Ambulatory Visit: Payer: Self-pay | Admitting: Vascular Surgery

## 2015-08-13 ENCOUNTER — Ambulatory Visit (HOSPITAL_COMMUNITY)
Admission: RE | Admit: 2015-08-13 | Discharge: 2015-08-13 | Disposition: A | Discharge status: Home / Self Care | Payer: Medicare Other | Source: Ambulatory Visit | Attending: Vascular Surgery | Admitting: Vascular Surgery

## 2015-08-13 ENCOUNTER — Encounter: Payer: Self-pay | Admitting: Vascular Surgery

## 2015-08-13 VITALS — BP 149/75 | HR 53 | Ht 65.0 in | Wt 132.0 lb

## 2015-08-13 DIAGNOSIS — I739 Peripheral vascular disease, unspecified: Secondary | ICD-10-CM | POA: Diagnosis not present

## 2015-08-13 DIAGNOSIS — Z48812 Encounter for surgical aftercare following surgery on the circulatory system: Secondary | ICD-10-CM

## 2015-08-13 DIAGNOSIS — I2510 Atherosclerotic heart disease of native coronary artery without angina pectoris: Secondary | ICD-10-CM

## 2015-08-13 DIAGNOSIS — E785 Hyperlipidemia, unspecified: Secondary | ICD-10-CM | POA: Insufficient documentation

## 2015-08-13 DIAGNOSIS — Z87891 Personal history of nicotine dependence: Secondary | ICD-10-CM | POA: Diagnosis not present

## 2015-08-13 DIAGNOSIS — Z95828 Presence of other vascular implants and grafts: Secondary | ICD-10-CM | POA: Insufficient documentation

## 2015-08-13 NOTE — Progress Notes (Signed)
[facility]  Patient name: Willie Kennedy MRN: [id number] DOB: [birth year] Sex: male  REASON FOR VISIT: Follow up of peripheral vascular disease  HPI: Willie Kennedy is a 64 y.o. male who I last saw on 08/07/2014. He had undergone a left femoropopliteal bypass graft in 2007 by Dr. Kellie Simmering. He was then lost to follow up. He then began his follow up with me. When I saw him last, he had triphasic Doppler signals in the dorsalis pedis and posterior tibial positions bilaterally with ABIs of 100%. His left femoropopliteal bypass graft was functioning well and he had normal pulses. He did have some evidence of chronic venous insufficiency. He comes in for a 1 year follow up visit.  He continues to have some pain in the left foot which he states that he's had since his surgery. I do not get any history of claudication or rest pain.  Past Medical History  Diagnosis Date  . Hyperlipidemia   . Peripheral vascular disease 2005    a. s/p left fem-pop Dr.Lawson in 2005.  . Biceps tendon rupture     h/o Right biceps tendon rupture  . Osteoarthritis   . CAD (coronary artery disease)     a. ETT myoview (10/11) with EF 52%, reversible inferior perfusion defect, 1 mm inferolateral ST depression with exercise, 7"16" and stopped due to dyspnea and leg weakness -> LHC (10/11), EF 55%, 40% pRCA, 40% mRCA, 40% prox to mid CFX. medical treatment.  . Impaired glucose tolerance    Family History  Problem Relation Age of Onset  . Cervical cancer Mother   . Cancer Mother     cervical  . Lung cancer Father   . Cancer Father     lung   SOCIAL HISTORY: Social History  Substance Use Topics  . Smoking status: Former Smoker    Types: Cigarettes    Quit date: 08/08/2000  . Smokeless tobacco: Never Used     Comment: quit in 2001  . Alcohol Use: No   No Known Allergies Current Outpatient Prescriptions  Medication Sig Dispense Refill  . atorvastatin (LIPITOR) 80 MG tablet Take 1  tablet (80 mg total) by mouth daily. 90 tablet 3  . gabapentin (NEURONTIN) 300 MG capsule Take 1 capsule (300 mg total) by mouth 3 (three) times daily. (Patient taking differently: Take 300 mg by mouth daily. ) 270 capsule 4  . nitroGLYCERIN (NITROSTAT) 0.4 MG SL tablet Place 1 tablet (0.4 mg total) under the tongue every 5 (five) minutes as needed. Place 1 tablet under tongue for chest pain -- not to exceed 3 in 15 minutes 100 tablet 11  . tiZANidine (ZANAFLEX) 4 MG tablet TAKE ONE TABLET BY MOUTH EVERY 6 HOURS AS NEEDED FOR MUSCLE SPASM 60 tablet 0  . traMADol (ULTRAM) 50 MG tablet Take 1 tablet (50 mg total) by mouth every 6 (six) hours as needed. With limit 60 per month 60 tablet 2  . aspirin 81 MG tablet Take 81 mg by mouth 2 (two) times daily.       No current facility-administered medications for this visit.   REVIEW OF SYSTEMS: Valu.Nieves ] denotes positive finding; [  ] denotes negative finding  CARDIOVASCULAR:  [ ]  chest pain   [ ]  chest pressure   [ ]  palpitations   [ ]  orthopnea   [ ]  dyspnea on exertion   [ ]  claudication   [ ]  rest pain   [ ]  DVT   [ ]   phlebitis PULMONARY:   [ ]  productive cough   [ ]  asthma   [ ]  wheezing NEUROLOGIC:   [ ]  weakness  [ ]  paresthesias  [ ]  aphasia  [ ]  amaurosis  [ ]  dizziness HEMATOLOGIC:   [ ]  bleeding problems   [ ]  clotting disorders MUSCULOSKELETAL:  [ ]  joint pain   [ ]  joint swelling [ ]  leg swelling GASTROINTESTINAL: [ ]   blood in stool  [ ]   hematemesis GENITOURINARY:  [ ]   dysuria  [ ]   hematuria PSYCHIATRIC:  [ ]  history of major depression INTEGUMENTARY:  [ ]  rashes  [ ]  ulcers CONSTITUTIONAL:  [ ]  fever   [ ]  chills  PHYSICAL EXAM: Filed Vitals:   08/13/15 1537 08/13/15 1539  BP: 149/73 149/75  Pulse: 53   Height: 5\' 5"  (1.651 m)   Weight: 132 lb (59.875 kg)   SpO2: 96%    GENERAL: The patient is a well-nourished male, in no acute distress. The vital signs are documented above. CARDIAC: There is a regular rate and rhythm.  VASCULAR:  I do not detect carotid bruits. He has palpable femoral pulses. He has a palpable left popliteal pulse. Left foot is warm and well-perfused. PULMONARY: There is good air exchange bilaterally without wheezing or rales. ABDOMEN: Soft and non-tender with normal pitched bowel sounds.  MUSCULOSKELETAL: There are no major deformities or cyanosis. NEUROLOGIC: No focal weakness or paresthesias are detected. SKIN: There are no ulcers or rashes noted. PSYCHIATRIC: The patient has a normal affect.  DATA:  LOWER EXTREMITY ARTERIAL DUPLEX: I have independently interpreted his duplex. He has triphasic Doppler signals throughout his left femoropopliteal bypass graft. There are no areas of stenosis identified.  LOWER EXTREMITY ARTERIAL DOPPLER STUDY: I have independently interpreted his lower extremity arterial Doppler study. On the right side, he has a biphasic dorsalis pedis signal with a triphasic posterior tibial signal. ABI is 98%. On the left side he has a triphasic dorsalis pedis and posterior tibial signal with an ABI of 100%.  MEDICAL ISSUES:  STATUS POST LEFT FEMOROPOPLITEAL BYPASS GRAFT: His left femoropopliteal bypass graft is functioning well. There are no areas of concern within the graft. I've encouraged and it stays active as possible. I've ordered follow up studies in 1 year and I'll see him back at that time. He is to call sooner if he has problems.   HYPERTENSION: The patient's initial blood pressure today was elevated. We repeated this and this was still elevated. We have encouraged the patient to follow up with their primary care physician for management of their blood pressure.   Deitra Mayo [facility] [place]: [phone]

## 2015-08-15 ENCOUNTER — Encounter (HOSPITAL_COMMUNITY)
Admission: RE | Admit: 2015-08-15 | Discharge: 2015-08-15 | Disposition: A | Discharge status: Home / Self Care | Payer: Medicare Other | Source: Ambulatory Visit | Attending: General Surgery | Admitting: General Surgery

## 2015-08-15 ENCOUNTER — Encounter (HOSPITAL_COMMUNITY): Payer: Self-pay

## 2015-08-15 DIAGNOSIS — Z01818 Encounter for other preprocedural examination: Secondary | ICD-10-CM | POA: Insufficient documentation

## 2015-08-15 DIAGNOSIS — K439 Ventral hernia without obstruction or gangrene: Secondary | ICD-10-CM | POA: Insufficient documentation

## 2015-08-15 HISTORY — DX: Headache: R51

## 2015-08-15 HISTORY — DX: Dizziness and giddiness: R42

## 2015-08-15 HISTORY — DX: Malignant (primary) neoplasm, unspecified: C80.1

## 2015-08-15 HISTORY — DX: Tinnitus, unspecified ear: H93.19

## 2015-08-15 HISTORY — DX: Headache, unspecified: R51.9

## 2015-08-15 HISTORY — DX: Reserved for inherently not codable concepts without codable children: IMO0001

## 2015-08-15 HISTORY — DX: Unspecified hearing loss, unspecified ear: H91.90

## 2015-08-15 HISTORY — DX: Paresthesia of skin: R20.2

## 2015-08-15 HISTORY — DX: Personal history of other diseases of the respiratory system: Z87.09

## 2015-08-15 HISTORY — DX: Paresthesia of skin: R20.0

## 2015-08-15 LAB — CBC WITH DIFFERENTIAL/PLATELET
Basophils Absolute: 0 10*3/uL (ref 0.0–0.1)
Basophils Relative: 1 %
Eosinophils Absolute: 0.1 10*3/uL (ref 0.0–0.7)
Eosinophils Relative: 1 %
HCT: 42.1 % (ref 39.0–52.0)
Hemoglobin: 13.8 g/dL (ref 13.0–17.0)
Lymphocytes Relative: 32 %
Lymphs Abs: 1.6 10*3/uL (ref 0.7–4.0)
MCH: 30.5 pg (ref 26.0–34.0)
MCHC: 32.8 g/dL (ref 30.0–36.0)
MCV: 93.1 fL (ref 78.0–100.0)
Monocytes Absolute: 0.5 10*3/uL (ref 0.1–1.0)
Monocytes Relative: 9 %
Neutro Abs: 2.8 10*3/uL (ref 1.7–7.7)
Neutrophils Relative %: 57 %
Platelets: 219 10*3/uL (ref 150–400)
RBC: 4.52 MIL/uL (ref 4.22–5.81)
RDW: 13.9 % (ref 11.5–15.5)
WBC: 5 10*3/uL (ref 4.0–10.5)

## 2015-08-15 LAB — COMPREHENSIVE METABOLIC PANEL
ALT: 16 U/L — ABNORMAL LOW (ref 17–63)
AST: 24 U/L (ref 15–41)
Albumin: 4 g/dL (ref 3.5–5.0)
Alkaline Phosphatase: 74 U/L (ref 38–126)
Anion gap: 7 (ref 5–15)
BUN: 15 mg/dL (ref 6–20)
CO2: 27 mmol/L (ref 22–32)
Calcium: 8.7 mg/dL — ABNORMAL LOW (ref 8.9–10.3)
Chloride: 105 mmol/L (ref 101–111)
Creatinine, Ser: 1.02 mg/dL (ref 0.61–1.24)
GFR calc Af Amer: >60 mL/min (ref >60)
GFR calc non Af Amer: >60 mL/min (ref >60)
Glucose, Bld: 110 mg/dL — ABNORMAL HIGH (ref 65–99)
Potassium: 4.3 mmol/L (ref 3.5–5.1)
Sodium: 139 mmol/L (ref 135–145)
Total Bilirubin: 0.8 mg/dL (ref 0.3–1.2)
Total Protein: 6.6 g/dL (ref 6.5–8.1)

## 2015-08-15 LAB — PROTIME-INR
INR: 1.03 (ref 0.00–1.49)
Prothrombin Time: 13.7 seconds (ref 11.6–15.2)

## 2015-08-15 NOTE — Addendum Note (Signed)
Addended by: Dorthula Rue L on: 08/15/2015 03:01 PM   Modules accepted: Orders

## 2015-08-15 NOTE — Progress Notes (Addendum)
Telephone note noting clearance/Dr Crenshaw/cardiology in epic 07/11/2015  EKG/epic 06/27/2015 Stress test epic 07/11/2015 ECHO epic 07/17/2015 AAA duplex epic 10/04/2014

## 2015-08-15 NOTE — Patient Instructions (Addendum)
Willie Kennedy  08/15/2015   Your procedure is scheduled on: Friday August 22, 2015   Report to [facility] Main  Entrance take [place]  elevators to 3rd floor to  [place] at 5:00 AM.  Call this number if you have problems the morning of surgery [phone]   Remember: ONLY 1 PERSON MAY GO WITH YOU TO SHORT STAY TO GET  READY MORNING OF [place].  Do not eat food or drink liquids :After Midnight.     Take these medicines the morning of surgery with A SIP OF WATER: NONE              DISCONTINUE ASPIRIN OR ANY HERBAL MEDICATIONS 7 DAYS PRIOR TO SURGERY PER MD INSTRUCTION                                You may not have any metal on your body including hair pins and              piercings  Do not wear jewelry, lotions, powders or colognes, deodorant                           Men may shave face and neck.   Do not bring valuables to the hospital. [place].  Contacts, dentures or bridgework may not be worn into surgery.     Patients discharged the day of surgery will not be allowed to drive home.  Name and phone number of your driver:Willie Kennedy (wife) or Willie Kennedy (brother)  _____________________________________________________________________             [facility] - Preparing for Surgery Before surgery, you can play an important role.  Because skin is not sterile, your skin needs to be as free of germs as possible.  You can reduce the number of germs on your skin by washing with CHG (chlorahexidine gluconate) soap before surgery.  CHG is an antiseptic cleaner which kills germs and bonds with the skin to continue killing germs even after washing. Please DO NOT use if you have an allergy to CHG or antibacterial soaps.  If your skin becomes reddened/irritated stop using the CHG and inform your nurse when you arrive at Short Stay. Do not shave (including legs and underarms) for at least 48 hours  prior to the first CHG shower.  You may shave your face/neck. Please follow these instructions carefully:  1.  Shower with CHG Soap the night before surgery and the  morning of Surgery.  2.  If you choose to wash your hair, wash your hair first as usual with your  normal  shampoo.  3.  After you shampoo, rinse your hair and body thoroughly to remove the  shampoo.                           4.  Use CHG as you would any other liquid soap.  You can apply chg directly  to the skin and wash                       Gently with a scrungie or clean washcloth.  5.  Apply the CHG Soap to your body ONLY FROM THE NECK DOWN.   Do not use on face/ open                           Wound or open sores. Avoid contact with eyes, ears mouth and genitals (private parts).                       Wash face,  Genitals (private parts) with your normal soap.             6.  Wash thoroughly, paying special attention to the area where your surgery  will be performed.  7.  Thoroughly rinse your body with warm water from the neck down.  8.  DO NOT shower/wash with your normal soap after using and rinsing off  the CHG Soap.                9.  Pat yourself dry with a clean towel.            10.  Wear clean pajamas.            11.  Place clean sheets on your bed the night of your first shower and do not  sleep with pets. Day of Surgery : Do not apply any lotions/deodorants the morning of surgery.  Please wear clean clothes to the hospital/surgery center.  FAILURE TO FOLLOW THESE INSTRUCTIONS MAY RESULT IN THE CANCELLATION OF YOUR SURGERY PATIENT SIGNATURE_________________________________  NURSE SIGNATURE__________________________________  ________________________________________________________________________

## 2015-08-22 ENCOUNTER — Ambulatory Visit (HOSPITAL_COMMUNITY): Payer: Medicare Other | Admitting: Anesthesiology

## 2015-08-22 ENCOUNTER — Encounter (HOSPITAL_COMMUNITY): Payer: Self-pay | Admitting: *Deleted

## 2015-08-22 ENCOUNTER — Encounter (HOSPITAL_COMMUNITY)
Admission: RE | Disposition: A | Discharge status: Home / Self Care | Payer: Self-pay | Source: Ambulatory Visit | Attending: General Surgery

## 2015-08-22 ENCOUNTER — Ambulatory Visit (HOSPITAL_COMMUNITY)
Admission: RE | Admit: 2015-08-22 | Discharge: 2015-08-22 | Disposition: A | Discharge status: Home / Self Care | Payer: Medicare Other | Source: Ambulatory Visit | Attending: General Surgery | Admitting: General Surgery

## 2015-08-22 DIAGNOSIS — H9190 Unspecified hearing loss, unspecified ear: Secondary | ICD-10-CM | POA: Insufficient documentation

## 2015-08-22 DIAGNOSIS — I2510 Atherosclerotic heart disease of native coronary artery without angina pectoris: Secondary | ICD-10-CM | POA: Insufficient documentation

## 2015-08-22 DIAGNOSIS — K439 Ventral hernia without obstruction or gangrene: Secondary | ICD-10-CM | POA: Insufficient documentation

## 2015-08-22 DIAGNOSIS — Z7982 Long term (current) use of aspirin: Secondary | ICD-10-CM | POA: Diagnosis not present

## 2015-08-22 DIAGNOSIS — Z79899 Other long term (current) drug therapy: Secondary | ICD-10-CM | POA: Insufficient documentation

## 2015-08-22 DIAGNOSIS — I739 Peripheral vascular disease, unspecified: Secondary | ICD-10-CM | POA: Insufficient documentation

## 2015-08-22 DIAGNOSIS — E785 Hyperlipidemia, unspecified: Secondary | ICD-10-CM | POA: Diagnosis not present

## 2015-08-22 DIAGNOSIS — Z87891 Personal history of nicotine dependence: Secondary | ICD-10-CM | POA: Insufficient documentation

## 2015-08-22 DIAGNOSIS — M1990 Unspecified osteoarthritis, unspecified site: Secondary | ICD-10-CM | POA: Insufficient documentation

## 2015-08-22 HISTORY — PX: VENTRAL HERNIA REPAIR: SHX424

## 2015-08-22 SURGERY — REPAIR, HERNIA, VENTRAL
Anesthesia: General | Site: Abdomen

## 2015-08-22 MED ORDER — NEOSTIGMINE METHYLSULFATE 10 MG/10ML IV SOLN
INTRAVENOUS | Status: AC
  Filled 2015-08-22: qty 1

## 2015-08-22 MED ORDER — ACETAMINOPHEN 650 MG RE SUPP
650.0000 mg | RECTAL | Status: DC | PRN
  Filled 2015-08-22: qty 1

## 2015-08-22 MED ORDER — LIDOCAINE HCL (CARDIAC) 20 MG/ML IV SOLN
INTRAVENOUS | Status: DC | PRN
  Administered 2015-08-22: 50 mg via INTRAVENOUS

## 2015-08-22 MED ORDER — PROPOFOL 10 MG/ML IV BOLUS
INTRAVENOUS | Status: DC | PRN
  Administered 2015-08-22: 170 mg via INTRAVENOUS

## 2015-08-22 MED ORDER — MORPHINE SULFATE (PF) 10 MG/ML IV SOLN: 2.0000 mg | INTRAVENOUS | Status: DC | PRN

## 2015-08-22 MED ORDER — PROPOFOL 10 MG/ML IV BOLUS
INTRAVENOUS | Status: AC
  Filled 2015-08-22: qty 20

## 2015-08-22 MED ORDER — GLYCOPYRROLATE 0.2 MG/ML IJ SOLN
INTRAMUSCULAR | Status: AC
  Filled 2015-08-22: qty 3

## 2015-08-22 MED ORDER — SUCCINYLCHOLINE CHLORIDE 20 MG/ML IJ SOLN
INTRAMUSCULAR | Status: DC | PRN
  Administered 2015-08-22: 100 mg via INTRAVENOUS

## 2015-08-22 MED ORDER — EPHEDRINE SULFATE 50 MG/ML IJ SOLN
INTRAMUSCULAR | Status: AC
  Filled 2015-08-22: qty 1

## 2015-08-22 MED ORDER — CEFAZOLIN SODIUM-DEXTROSE 2-3 GM-% IV SOLR
2.0000 g | INTRAVENOUS | Status: AC
  Administered 2015-08-22: 2 g via INTRAVENOUS

## 2015-08-22 MED ORDER — HYDROMORPHONE HCL 1 MG/ML IJ SOLN: 0.2500 mg | INTRAMUSCULAR | Status: DC | PRN

## 2015-08-22 MED ORDER — MIDAZOLAM HCL 2 MG/2ML IJ SOLN
INTRAMUSCULAR | Status: AC
  Filled 2015-08-22: qty 4

## 2015-08-22 MED ORDER — ONDANSETRON HCL 4 MG/2ML IJ SOLN
INTRAMUSCULAR | Status: AC
  Filled 2015-08-22: qty 2

## 2015-08-22 MED ORDER — ONDANSETRON HCL 4 MG/2ML IJ SOLN
INTRAMUSCULAR | Status: DC | PRN
  Administered 2015-08-22: 4 mg via INTRAVENOUS

## 2015-08-22 MED ORDER — LIDOCAINE HCL (CARDIAC) 20 MG/ML IV SOLN
INTRAVENOUS | Status: AC
  Filled 2015-08-22: qty 5

## 2015-08-22 MED ORDER — GLYCOPYRROLATE 0.2 MG/ML IJ SOLN
INTRAMUSCULAR | Status: DC | PRN
  Administered 2015-08-22: .6 mg via INTRAVENOUS

## 2015-08-22 MED ORDER — NEOSTIGMINE METHYLSULFATE 10 MG/10ML IV SOLN
INTRAVENOUS | Status: DC | PRN
  Administered 2015-08-22: 4 mg via INTRAVENOUS

## 2015-08-22 MED ORDER — SODIUM CHLORIDE 0.9 % IJ SOLN: 3.0000 mL | INTRAMUSCULAR | Status: DC | PRN

## 2015-08-22 MED ORDER — MIDAZOLAM HCL 5 MG/5ML IJ SOLN
INTRAMUSCULAR | Status: DC | PRN
  Administered 2015-08-22: 1 mg via INTRAVENOUS

## 2015-08-22 MED ORDER — FENTANYL CITRATE (PF) 250 MCG/5ML IJ SOLN
INTRAMUSCULAR | Status: AC
  Filled 2015-08-22: qty 25

## 2015-08-22 MED ORDER — FENTANYL CITRATE (PF) 100 MCG/2ML IJ SOLN
INTRAMUSCULAR | Status: DC | PRN
  Administered 2015-08-22: 100 ug via INTRAVENOUS

## 2015-08-22 MED ORDER — ROCURONIUM BROMIDE 100 MG/10ML IV SOLN
INTRAVENOUS | Status: DC | PRN
  Administered 2015-08-22: 10 mg via INTRAVENOUS

## 2015-08-22 MED ORDER — CEFAZOLIN SODIUM-DEXTROSE 2-3 GM-% IV SOLR
INTRAVENOUS | Status: AC
  Filled 2015-08-22: qty 50

## 2015-08-22 MED ORDER — OXYCODONE HCL 5 MG PO TABS
5.0000 mg | ORAL_TABLET | ORAL | Status: DC | PRN
  Administered 2015-08-22: 5 mg via ORAL
  Filled 2015-08-22: qty 1

## 2015-08-22 MED ORDER — OXYCODONE HCL 5 MG PO TABS: 5.0000 mg | ORAL_TABLET | ORAL | Status: DC | PRN

## 2015-08-22 MED ORDER — LACTATED RINGERS IV SOLN
INTRAVENOUS | Status: DC | PRN
  Administered 2015-08-22 (×2): via INTRAVENOUS

## 2015-08-22 MED ORDER — SODIUM CHLORIDE 0.9 % IJ SOLN
INTRAMUSCULAR | Status: AC
  Filled 2015-08-22: qty 10

## 2015-08-22 MED ORDER — BUPIVACAINE-EPINEPHRINE 0.5% -1:200000 IJ SOLN
INTRAMUSCULAR | Status: AC
  Filled 2015-08-22: qty 1

## 2015-08-22 MED ORDER — BUPIVACAINE-EPINEPHRINE 0.5% -1:200000 IJ SOLN
INTRAMUSCULAR | Status: DC | PRN
  Administered 2015-08-22: 14 mL

## 2015-08-22 MED ORDER — ROCURONIUM BROMIDE 100 MG/10ML IV SOLN
INTRAVENOUS | Status: AC
  Filled 2015-08-22: qty 1

## 2015-08-22 MED ORDER — ACETAMINOPHEN 325 MG PO TABS: 650.0000 mg | ORAL_TABLET | ORAL | Status: DC | PRN

## 2015-08-22 MED ORDER — 0.9 % SODIUM CHLORIDE (POUR BTL) OPTIME
TOPICAL | Status: DC | PRN
  Administered 2015-08-22: 1000 mL

## 2015-08-22 MED ORDER — EPHEDRINE SULFATE 50 MG/ML IJ SOLN
INTRAMUSCULAR | Status: DC | PRN
  Administered 2015-08-22 (×6): 5 mg via INTRAVENOUS

## 2015-08-22 SURGICAL SUPPLY — 37 items
BINDER ABDOMINAL 12 ML 46-62 (SOFTGOODS) IMPLANT
BLADE HEX COATED 2.75 (ELECTRODE) ×2 IMPLANT
COVER SURGICAL LIGHT HANDLE (MISCELLANEOUS) ×2 IMPLANT
DRAIN CHANNEL 19F RND (DRAIN) IMPLANT
DRAPE INCISE IOBAN 66X45 STRL (DRAPES) IMPLANT
DRAPE LAPAROSCOPIC ABDOMINAL (DRAPES) ×2 IMPLANT
DRAPE UTILITY XL STRL (DRAPES) ×2 IMPLANT
DRSG PAD ABDOMINAL 8X10 ST (GAUZE/BANDAGES/DRESSINGS) IMPLANT
DRSG TEGADERM 4X4.75 (GAUZE/BANDAGES/DRESSINGS) ×2 IMPLANT
ELECT REM PT RETURN 9FT ADLT (ELECTROSURGICAL) ×2
ELECTRODE REM PT RTRN 9FT ADLT (ELECTROSURGICAL) ×1 IMPLANT
EVACUATOR SILICONE 100CC (DRAIN) IMPLANT
GLOVE ECLIPSE 8.0 STRL XLNG CF (GLOVE) ×2 IMPLANT
GLOVE INDICATOR 8.0 STRL GRN (GLOVE) ×2 IMPLANT
GOWN STRL REUS W/TWL XL LVL3 (GOWN DISPOSABLE) ×4 IMPLANT
KIT BASIN OR (CUSTOM PROCEDURE TRAY) ×2 IMPLANT
LIGASURE IMPACT 36 18CM CVD LR (INSTRUMENTS) IMPLANT
MESH HERNIA 3X6 (Mesh General) ×1 IMPLANT
NDL HYPO 25X1 1.5 SAFETY (NEEDLE) ×1 IMPLANT
NEEDLE HYPO 25X1 1.5 SAFETY (NEEDLE) ×2 IMPLANT
PACK GENERAL/GYN (CUSTOM PROCEDURE TRAY) ×2 IMPLANT
STAPLER VISISTAT 35W (STAPLE) ×2 IMPLANT
STRIP CLOSURE SKIN 1/2X4 (GAUZE/BANDAGES/DRESSINGS) IMPLANT
SUT ETHILON 3 0 PS 1 (SUTURE) IMPLANT
SUT MNCRL AB 4-0 PS2 18 (SUTURE) ×1 IMPLANT
SUT NOVA NAB DX-16 0-1 5-0 T12 (SUTURE) IMPLANT
SUT NOVA NAB GS-21 0 18 T12 DT (SUTURE) ×1 IMPLANT
SUT PROLENE 0 CT 2 (SUTURE) ×1 IMPLANT
SUT VIC AB 2-0 CT1 27 (SUTURE)
SUT VIC AB 2-0 CT1 TAPERPNT 27 (SUTURE) IMPLANT
SUT VIC AB 3-0 SH 27 (SUTURE) ×2
SUT VIC AB 3-0 SH 27XBRD (SUTURE) IMPLANT
SYR 20CC LL (SYRINGE) ×2 IMPLANT
TOWEL OR 17X26 10 PK STRL BLUE (TOWEL DISPOSABLE) ×2 IMPLANT
TOWEL OR NON WOVEN STRL DISP B (DISPOSABLE) ×2 IMPLANT
TRAY FOLEY W/METER SILVER 14FR (SET/KITS/TRAYS/PACK) IMPLANT
TRAY FOLEY W/METER SILVER 16FR (SET/KITS/TRAYS/PACK) IMPLANT

## 2015-08-22 NOTE — Anesthesia Procedure Notes (Signed)
Procedure Name: Intubation Date/Time: 08/22/2015 7:31 AM Performed by: Dimas Millin, Chevon Laufer F Pre-anesthesia Checklist: Patient identified, Emergency Drugs available, Suction available, Patient being monitored and Timeout performed Patient Re-evaluated:Patient Re-evaluated prior to inductionOxygen Delivery Method: Circle system utilized Preoxygenation: Pre-oxygenation with 100% oxygen Intubation Type: IV induction Laryngoscope Size: Miller and 2 Grade View: Grade I Tube type: Oral Tube size: 7.5 mm Number of attempts: 1 Airway Equipment and Method: Stylet Placement Confirmation: ETT inserted through vocal cords under direct vision,  positive ETCO2 and breath sounds checked- equal and bilateral Secured at: 22 cm Tube secured with: Tape Dental Injury: Teeth and Oropharynx as per pre-operative assessment

## 2015-08-22 NOTE — Anesthesia Postprocedure Evaluation (Signed)
  Anesthesia Post-op Note  Patient: Willie Kennedy  Procedure(s) Performed: Procedure(s): OPEN VENTRAL HERNIA REPAIR WITH MESH (N/A) Patient is awake and responsive. Pain and nausea are reasonably well controlled. Vital signs are stable and clinically acceptable. Oxygen saturation is clinically acceptable. There are no apparent anesthetic complications at this time. Patient is ready for discharge.

## 2015-08-22 NOTE — Op Note (Signed)
Operative Note  Willie Kennedy male 64 y.o. 08/22/2015  PREOPERATIVE DX:  Ventral hernia  POSTOPERATIVE DX:  Same  PROCEDURE:   Open ventral hernia with mesh         Surgeon: [name]   Assistants: none  Anesthesia: General endotracheal anesthesia  Indications:   This is a 64 year old male who developed a painful epigastric bulge consistent with an epigastric ventral hernia. He now presents for repair. He's had a preop cardiac evaluation and is not at increased risk for surgery.    Procedure Detail:  He was seen in the holding area and the hernia marked. He was brought to the operating room placed supine on the operating table and general anesthetic was given. The hair on the abdominal wall was clipped. The abdominal wall was sterilely prepped and draped.  In the mid epigastrium, where the bulge was, local anesthetic consisting of 1/2% Marcaine was infiltrated in the skin and subcutaneous tissues. A small longitudinal midline incision was made through the skin and subcutaneous tissue. The hernia sac and hernia contents were identified protruding into the subcutaneous tissue. I dissected this free from the subcutaneous tissue and identified the small hernia defect which measured 1 cm. Excess hernia contents was removed using electrocautery and the rest of the hernia contents were reduced. Subcutaneous tissue was then dissected free from the fascia 3-4 cm around the primary defect. I then closed the primary defect with interrupted #1 Novafil sutures left long. A piece of polypropylene mesh was brought into the field. The primary closure sutures are threaded up to the mesh and then tied down anchoring the mesh directly over the primary closure. The periphery of the mesh was then anchored to the fascia, a distance of 3 cm from the primary repair, with running 0 Prolene suture. Excess mesh was trimmed and removed.  The wound was inspected and hemostasis was adequate. The subcutaneous  tissue was closed with running 3-0 Vicryl suture. The skin was closed with a 4-0 Monocryl subcuticular stitch. Steri-Strips and a sterile dressing were applied.  He tolerated the procedure well without any apparent complications and was taken to the recovery room in satisfactory condition.  Estimated Blood Loss:  less than 100 mL         Specimens: Hernia contents        Complications:  * No complications entered in OR log *         Disposition: PACU - hemodynamically stable.         Condition: stable

## 2015-08-22 NOTE — Interval H&P Note (Signed)
History and Physical Interval Note:  08/22/2015 7:23 AM  Willie Kennedy  has presented today for surgery, with the diagnosis of Ventral Hernia  The various methods of treatment have been discussed with the patient and family. After consideration of risks, benefits and other options for treatment, the patient has consented to  Procedure(s): OPEN VENTRAL HERNIA REPAIR WITH MESH (N/A) as a surgical intervention .  The patient's history has been reviewed, patient examined, no change in status, stable for surgery.  I have reviewed the patient's chart and labs.  Questions were answered to the patient's satisfaction.     Quin Mcpherson Lenna Sciara

## 2015-08-22 NOTE — Discharge Instructions (Addendum)
[facility] _______[facility]  HERNIA REPAIR: POST OP INSTRUCTIONS  Always review your discharge instruction sheet given to you by the facility where your surgery was performed. IF YOU HAVE DISABILITY OR FAMILY LEAVE FORMS, YOU MUST BRING THEM TO THE OFFICE FOR PROCESSING.   DO NOT GIVE THEM TO YOUR DOCTOR.  1. A  prescription for pain medication may be given to you upon discharge.  Take your pain medication as prescribed, if needed.  If narcotic pain medicine is not needed, then you may take acetaminophen (Tylenol) or ibuprofen (Advil) as needed. 2. Take your usually prescribed medications unless otherwise directed. 3. If you need a refill on your pain medication, please contact your pharmacy.  They will contact our office to request authorization. Prescriptions will not be filled after 5 pm or on week-ends. 4. You should follow a light diet the first 24 hours after arrival home, such as soup and crackers, etc.  Be sure to include lots of fluids daily.  Resume your normal diet the day after surgery. 5. Most patients will experience some swelling and bruising around the incision.  Ice packs and reclining will help.  Swelling and bruising can take several days-weeks to resolve.  6. It is common to experience some constipation if taking pain medication after surgery.  Increasing fluid intake and taking a stool softener (such as Colace) will usually help or prevent this problem from occurring.  A mild laxative (Milk of Magnesia or Miralax) should be taken according to package directions if there are no bowel movements after 48 hours. 7. Unless discharge instructions indicate otherwise, you may remove your bandages 72 hours after surgery;  you may shower the day after your surgery.  You may have steri-strips (small skin tapes) in place directly over the incision.  These strips should be left on the skin.  If your surgeon used skin glue on the incision, you may shower in 24 hours.  The glue will  flake off over the next 2-3 weeks.  Any sutures or staples will be removed at the office during your follow-up visit. 8. ACTIVITIES:  You may resume regular (light) daily activities beginning the next day--such as daily self-care, walking, climbing stairs--gradually increasing activities as tolerated.  You may have sexual intercourse when it is comfortable.  Refrain from any heavy lifting or straining-nothing over 10 pounds for 6 weeks.  a. You may drive when you are no longer taking prescription pain medication, you can comfortably wear a seatbelt, and you can safely maneuver your car and apply brakes. b. RETURN TO WORK:  __________________________________________________________ 9. You should see your doctor in the office for a follow-up appointment approximately 2-3 weeks after your surgery.  Make sure that you call for this appointment within a day or two after you arrive home to insure a convenient appointment time. 10. OTHER INSTRUCTIONS:  __________________________________________________________________________________________________________________________________________________________________________________________  WHEN TO CALL YOUR DOCTOR: 1. Fever over 101.0 2. Inability to urinate 3. Nausea and/or vomiting 4. Extreme swelling or bruising 5. Continued bleeding from incision. 6. Increased pain, redness, or drainage from the incision  The clinic staff is available to answer your questions during regular business hours.  Please dont hesitate to call and ask to speak to one of the nurses for clinical concerns.  If you have a medical emergency, go to the nearest emergency room or call 911.  A surgeon from [facility] is always on call at the hospital   [address], [place], [place], [place]  [address] ?  [address], [place], [place]   [address] [phone] ? [phone] ? FAX [phone] Web site: [email or web address]  General Anesthesia, Care  After Refer to this sheet in the next few weeks. These instructions provide you with information on caring for yourself after your procedure. Your health care provider may also give you more specific instructions. Your treatment has been planned according to current medical practices, but problems sometimes occur. Call your health care provider if you have any problems or questions after your procedure. WHAT TO EXPECT AFTER THE PROCEDURE After the procedure, it is typical to experience:  Sleepiness.  Nausea and vomiting. HOME CARE INSTRUCTIONS  For the first 24 hours after general anesthesia:  Have a responsible person with you.  Do not drive a car. If you are alone, do not take public transportation.  Do not drink alcohol.  Do not take medicine that has not been prescribed by your health care provider.  Do not sign important papers or make important decisions.  You may resume a normal diet and activities as directed by your health care provider.  Change bandages (dressings) as directed.  If you have questions or problems that seem related to general anesthesia, call the hospital and ask for the anesthetist or anesthesiologist on call. SEEK MEDICAL CARE IF:  You have nausea and vomiting that continue the day after anesthesia.  You develop a rash. SEEK IMMEDIATE MEDICAL CARE IF:   You have difficulty breathing.  You have chest pain.  You have any allergic problems. Document Released: 02/21/2001 Document Revised: 11/20/2013 Document Reviewed: 05/31/2013 [facility] Patient Information 2015 [place], [place]. This information is not intended to replace advice given to you by your health care provider. Make sure you discuss any questions you have with your health care provider.

## 2015-08-22 NOTE — Anesthesia Preprocedure Evaluation (Signed)
Anesthesia Evaluation  Patient identified by MRN, date of birth, ID band Patient awake    Reviewed: Allergy & Precautions, H&P , Patient's Chart, lab work & pertinent test results, reviewed documented beta blocker date and time   Airway Mallampati: II  TM Distance: >3 FB Neck ROM: full    Dental no notable dental hx.    Pulmonary former smoker,    Pulmonary exam normal breath sounds clear to auscultation       Cardiovascular + CAD (takes NTG)   Rhythm:regular Rate:Normal     Neuro/Psych    GI/Hepatic   Endo/Other    Renal/GU      Musculoskeletal   Abdominal   Peds  Hematology   Anesthesia Other Findings   Reproductive/Obstetrics                             Anesthesia Physical Anesthesia Plan  ASA: II  Anesthesia Plan: General   Post-op Pain Management:    Induction: Intravenous  Airway Management Planned: Oral ETT and LMA  Additional Equipment:   Intra-op Plan:   Post-operative Plan: Extubation in OR  Informed Consent: I have reviewed the patients History and Physical, chart, labs and discussed the procedure including the risks, benefits and alternatives for the proposed anesthesia with the patient or authorized representative who has indicated his/her understanding and acceptance.   Dental Advisory Given and Dental advisory given  Plan Discussed with: CRNA and Surgeon  Anesthesia Plan Comments: (  Discussed general anesthesia ( ETT vs LMA), including possible nausea, instrumentation of airway, sore throat,pulmonary aspiration, etc. I asked if the were any outstanding questions, or  concerns before we proceeded. )        Anesthesia Quick Evaluation

## 2015-08-22 NOTE — H&P (Signed)
Willie Kennedy is an 64 y.o. male.   Chief Complaint:   Here for elective surgery HPI: He was referred by Dr. Jenny Reichmann for further evaluation of a primary ventral hernia. He was a Airline pilot in the past. For 3 years, he's had some intermittent epigastric pain. For the past 2 years, he is noted a bulge in the mid epigastrium that is painful at times. He does do heavy lifting at work. No obstruction symptoms. This becomes symptomatic when he lifts very heavy items and when he overeats. No problems with straining to urinate or constipation.  Dr. Stanford Breed feels it is safe from a cardiac standpoint to proceed with the surgery.   Past Medical History  Diagnosis Date  . Hyperlipidemia   . Biceps tendon rupture     h/o Right biceps tendon rupture  . Osteoarthritis   . CAD (coronary artery disease)     a. ETT myoview (10/11) with EF 52%, reversible inferior perfusion defect, 1 mm inferolateral ST depression with exercise, 7"16" and stopped due to dyspnea and leg weakness -> LHC (10/11), EF 55%, 40% pRCA, 40% mRCA, 40% prox to mid CFX. medical treatment.  . Impaired glucose tolerance   . Peripheral vascular disease 2005    a. s/p left fem-pop Dr.Lawson in 2005.  Marland Kitchen Shortness of breath dyspnea     walking distances or climbing stairs  . History of bronchitis   . Cancer     skin cancer behind left ear   . Numbness and tingling in left arm     if holds arm upward too long pt states his primary MD is aware  . Headache     with bright light   . Tinnitus   . Hard of hearing     more per right ear  . Dizziness     with bending over     Past Surgical History  Procedure Laterality Date  . Ds/p left femoral artery occlusion  2005    s/p left fem-pop bypass- Dr. Kellie Simmering  . S/p right bicep tendon rupture repair    . Pr vein bypass graft,aorto-fem-pop      Family History  Problem Relation Age of Onset  . Cervical cancer Mother   . Cancer Mother     cervical  . Lung cancer Father   . Cancer  Father     lung   Social History:  reports that he quit smoking about 20 years ago. His smoking use included Cigarettes. He has a 280 pack-year smoking history. He has never used smokeless tobacco. He reports that he does not drink alcohol or use illicit drugs.  Allergies: No Known Allergies  Medications Prior to Admission  Medication Sig Dispense Refill  . aspirin 81 MG tablet Take 81 mg by mouth daily.     Marland Kitchen atorvastatin (LIPITOR) 80 MG tablet Take 1 tablet (80 mg total) by mouth daily. 90 tablet 3  . gabapentin (NEURONTIN) 300 MG capsule Take 1 capsule (300 mg total) by mouth 3 (three) times daily. (Patient taking differently: Take 300 mg by mouth at bedtime. ) 270 capsule 4  . nitroGLYCERIN (NITROSTAT) 0.4 MG SL tablet Place 1 tablet (0.4 mg total) under the tongue every 5 (five) minutes as needed. Place 1 tablet under tongue for chest pain -- not to exceed 3 in 15 minutes 100 tablet 11  . tiZANidine (ZANAFLEX) 4 MG tablet TAKE ONE TABLET BY MOUTH EVERY 6 HOURS AS NEEDED FOR MUSCLE SPASM 60 tablet 0  .  traMADol (ULTRAM) 50 MG tablet Take 1 tablet (50 mg total) by mouth every 6 (six) hours as needed. With limit 60 per month 60 tablet 2    No results found for this or any previous visit (from the past 48 hour(s)). No results found.  Review of Systems  Constitutional: Negative.   Gastrointestinal: Positive for abdominal pain (Occasionally at hernia site).  Genitourinary: Negative.     Blood pressure 133/67, pulse 49, temperature 97.5 F (36.4 C), temperature source Oral, resp. rate 16, height 5\' 5"  (1.651 m), weight 60.385 kg (133 lb 2 oz), SpO2 99 %. Physical Exam  Constitutional: He appears well-developed and well-nourished. No distress.  HENT:  Head: Normocephalic and atraumatic.  GI: Soft.  Tender epigastric bulge  Musculoskeletal: He exhibits no edema.  Neurological: He is alert.  Skin: Skin is warm and dry.  Psychiatric: He has a normal mood and affect. His behavior is  normal.     Assessment/Plan Symptomatic epigastric ventral hernia  Plan:  Open repair with mesh.  Kyonna Frier J 08/22/2015, 7:20 AM

## 2015-08-22 NOTE — Transfer of Care (Signed)
Immediate Anesthesia Transfer of Care Note  Patient: Willie Kennedy  Procedure(s) Performed: Procedure(s): OPEN VENTRAL HERNIA REPAIR WITH MESH (N/A)  Patient Location: PACU  Anesthesia Type:General  Level of Consciousness: awake, alert  and oriented  Airway & Oxygen Therapy: Patient Spontanous Breathing and Patient connected to face mask oxygen  Post-op Assessment: Report given to RN and Post -op Vital signs reviewed and stable  Post vital signs: Reviewed and stable  Last Vitals:  Filed Vitals:   08/22/15 0530  BP: 133/67  Pulse: 49  Temp: 36.4 C  Resp: 16    Complications: No apparent anesthesia complications

## 2015-08-27 ENCOUNTER — Telehealth: Payer: Self-pay | Admitting: Internal Medicine

## 2015-08-27 NOTE — Telephone Encounter (Signed)
Patient had concert tickets and he needed surgery. In order for them to get their money back, they need a letter from his doctor stating he was unable to attend. Would Dr. Jenny Reichmann be able to do that or do they need to go through the surgeon who performed the surgery.  Please advise Best number to call is [phone]

## 2015-08-27 NOTE — Telephone Encounter (Signed)
Pt informed

## 2015-08-27 NOTE — Telephone Encounter (Signed)
appt made

## 2015-08-27 NOTE — Telephone Encounter (Signed)
The surgeon's office

## 2015-09-30 NOTE — Progress Notes (Signed)
HPI: FU coronary artery disease. Patient has had previous left fem-pop in 2005. PVD followed by vascular surgery. Cardiac catheterization in October of 2011 showed an ejection fraction of 55%, 40% right coronary artery proximally and 40% mid. 50 proximal to mid circumflex. Medical therapy recommended. Nuclear study August 2016 showed ejection fraction 43%. There was normal perfusion. Abdominal ultrasound November 2015 showed no aneurysm. Echocardiogram August 2016 showed normal LV systolic function and grade 1 diastolic dysfunction. Since last seen,   Current Outpatient Prescriptions  Medication Sig Dispense Refill  . aspirin 81 MG tablet Take 81 mg by mouth daily.     Marland Kitchen atorvastatin (LIPITOR) 80 MG tablet Take 1 tablet (80 mg total) by mouth daily. 90 tablet 3  . gabapentin (NEURONTIN) 300 MG capsule Take 1 capsule (300 mg total) by mouth 3 (three) times daily. (Patient taking differently: Take 300 mg by mouth at bedtime. ) 270 capsule 4  . nitroGLYCERIN (NITROSTAT) 0.4 MG SL tablet Place 1 tablet (0.4 mg total) under the tongue every 5 (five) minutes as needed. Place 1 tablet under tongue for chest pain -- not to exceed 3 in 15 minutes 100 tablet 11  . oxyCODONE (OXY IR/ROXICODONE) 5 MG immediate release tablet Take 1-2 tablets (5-10 mg total) by mouth every 4 (four) hours as needed for moderate pain, severe pain or breakthrough pain. 40 tablet 0  . tiZANidine (ZANAFLEX) 4 MG tablet TAKE ONE TABLET BY MOUTH EVERY 6 HOURS AS NEEDED FOR MUSCLE SPASM 60 tablet 0  . traMADol (ULTRAM) 50 MG tablet Take 1 tablet (50 mg total) by mouth every 6 (six) hours as needed. With limit 60 per month 60 tablet 2   No current facility-administered medications for this visit.     Past Medical History  Diagnosis Date  . Hyperlipidemia   . Biceps tendon rupture     h/o Right biceps tendon rupture  . Osteoarthritis   . CAD (coronary artery disease)     a. ETT myoview (10/11) with EF 52%, reversible  inferior perfusion defect, 1 mm inferolateral ST depression with exercise, 7"16" and stopped due to dyspnea and leg weakness -> LHC (10/11), EF 55%, 40% pRCA, 40% mRCA, 40% prox to mid CFX. medical treatment.  . Impaired glucose tolerance   . Peripheral vascular disease 2005    a. s/p left fem-pop Dr.Lawson in 2005.  Marland Kitchen Shortness of breath dyspnea     walking distances or climbing stairs  . History of bronchitis   . Cancer     skin cancer behind left ear   . Numbness and tingling in left arm     if holds arm upward too long pt states his primary MD is aware  . Headache     with bright light   . Tinnitus   . Hard of hearing     more per right ear  . Dizziness     with bending over     Past Surgical History  Procedure Laterality Date  . Ds/p left femoral artery occlusion  2005    s/p left fem-pop bypass- Dr. Kellie Simmering  . S/p right bicep tendon rupture repair    . Pr vein bypass graft,aorto-fem-pop    . Ventral hernia repair N/A 08/22/2015    Procedure: OPEN VENTRAL HERNIA REPAIR WITH MESH;  Surgeon: Jackolyn Confer, MD;  Location: [facility];  Service: General;  Laterality: N/A;    Social History   Social History  . Marital Status: Married  Spouse Name: N/A  . Number of Children: 1  . Years of Education: N/A   Occupational History  . Master woood craftsman    Social History Main Topics  . Smoking status: Former Smoker -- 7.00 packs/day for 40 years    Types: Cigarettes    Quit date: 11/29/1994  . Smokeless tobacco: Never Used  . Alcohol Use: No  . Drug Use: No  . Sexual Activity: Not on file   Other Topics Concern  . Not on file   Social History Narrative    ROS: no fevers or chills, productive cough, hemoptysis, dysphasia, odynophagia, melena, hematochezia, dysuria, hematuria, rash, seizure activity, orthopnea, PND, pedal edema, claudication. Remaining systems are negative.  Physical Exam: Well-developed well-nourished in no acute distress.  Skin is warm and dry.    HEENT is normal.  Neck is supple.  Chest is clear to auscultation with normal expansion.  Cardiovascular exam is regular rate and rhythm.  Abdominal exam nontender or distended. No masses palpated. Extremities show no edema. neuro grossly intact  ECG     This encounter was created in error - please disregard.

## 2015-10-03 ENCOUNTER — Encounter: Payer: Self-pay | Admitting: Cardiology

## 2015-11-04 ENCOUNTER — Ambulatory Visit: Payer: Self-pay | Admitting: Cardiology

## 2015-12-03 NOTE — Progress Notes (Signed)
HPI: FU nonobstructive CAD, PVD s/p previous left fem-pop in 2005 followed by vascular, h/o longstanding brief atypical chest pains. H/o nuc in 2011 showing EF 52% and reversible inferior defect. Subsequent cath 10/11 showed 40% prox RCA, 40% mid RCA, 50% prox-to-mid Cx, EF 55% with some basal inferior HK. Screening AAA duplex negative for aneurysm 09/2014. Nuclear study repeated August 2016 and showed ejection fraction 43%. There was normal perfusion. Echocardiogram August 2016 showed normal LV function with ejection fraction 60-65% and grade 1 diastolic dysfunction. Since last seen, He has some dyspnea on exertion but denies orthopnea, PND or pedal edema. He continues to have occasional chest pain. It is under the left breast and described as a sharp sensation. Last seconds to minutes. He does not have exertional chest pain. He has various pains in his right shoulder, legs and abdomen as well. Note there is no associated nausea, dyspnea or diaphoresis. The pain does not radiate.  Current Outpatient Prescriptions  Medication Sig Dispense Refill  . aspirin 81 MG tablet Take 81 mg by mouth daily.     Marland Kitchen atorvastatin (LIPITOR) 80 MG tablet Take 1 tablet (80 mg total) by mouth daily. 90 tablet 3  . gabapentin (NEURONTIN) 300 MG capsule Take 1 capsule (300 mg total) by mouth 3 (three) times daily. (Patient taking differently: Take 300 mg by mouth at bedtime. ) 270 capsule 4  . nitroGLYCERIN (NITROSTAT) 0.4 MG SL tablet Place 1 tablet (0.4 mg total) under the tongue every 5 (five) minutes as needed. Place 1 tablet under tongue for chest pain -- not to exceed 3 in 15 minutes 100 tablet 11  . tiZANidine (ZANAFLEX) 4 MG tablet TAKE ONE TABLET BY MOUTH EVERY 6 HOURS AS NEEDED FOR MUSCLE SPASM 60 tablet 0  . traMADol (ULTRAM) 50 MG tablet Take 1 tablet (50 mg total) by mouth every 6 (six) hours as needed. With limit 60 per month 60 tablet 2   No current facility-administered medications for this visit.      Past Medical History  Diagnosis Date  . Hyperlipidemia   . Biceps tendon rupture     h/o Right biceps tendon rupture  . Osteoarthritis   . CAD (coronary artery disease)     a. ETT myoview (10/11) with EF 52%, reversible inferior perfusion defect, 1 mm inferolateral ST depression with exercise, 7"16" and stopped due to dyspnea and leg weakness -> LHC (10/11), EF 55%, 40% pRCA, 40% mRCA, 40% prox to mid CFX. medical treatment.  . Impaired glucose tolerance   . Peripheral vascular disease ([place]) 2005    a. s/p left fem-pop Dr.Lawson in 2005.  Marland Kitchen Shortness of breath dyspnea     walking distances or climbing stairs  . History of bronchitis   . Cancer ([place])     skin cancer behind left ear   . Numbness and tingling in left arm     if holds arm upward too long pt states his primary MD is aware  . Headache     with bright light   . Tinnitus   . Hard of hearing     more per right ear  . Dizziness     with bending over     Past Surgical History  Procedure Laterality Date  . Ds/p left femoral artery occlusion  2005    s/p left fem-pop bypass- Dr. Kellie Simmering  . S/p right bicep tendon rupture repair    . Pr vein bypass graft,aorto-fem-pop    .  Ventral hernia repair N/A 08/22/2015    Procedure: OPEN VENTRAL HERNIA REPAIR WITH MESH;  Surgeon: Jackolyn Confer, MD;  Location: [facility];  Service: General;  Laterality: N/A;    Social History   Social History  . Marital Status: Married    Spouse Name: N/A  . Number of Children: 1  . Years of Education: N/A   Occupational History  . Master woood craftsman    Social History Main Topics  . Smoking status: Former Smoker -- 7.00 packs/day for 40 years    Types: Cigarettes    Quit date: 11/29/1994  . Smokeless tobacco: Never Used  . Alcohol Use: No  . Drug Use: No  . Sexual Activity: Not on file   Other Topics Concern  . Not on file   Social History Narrative    ROS: no fevers or chills, productive cough, hemoptysis, dysphasia,  odynophagia, melena, hematochezia, dysuria, hematuria, rash, seizure activity, orthopnea, PND, pedal edema, claudication. Remaining systems are negative.  Physical Exam: Well-developed well-nourished in no acute distress.  Skin is warm and dry.  HEENT is normal.  Neck is supple.  Chest is clear to auscultation with normal expansion.  Cardiovascular exam is regular rate and rhythm.  Abdominal exam nontender or distended. No masses palpated. Extremities show no edema. neuro grossly intact  ECG Sinus rhythm at a rate of 54. Normal axis. No ST changes.

## 2015-12-05 ENCOUNTER — Ambulatory Visit (INDEPENDENT_AMBULATORY_CARE_PROVIDER_SITE_OTHER): Payer: Medicare Other | Admitting: Cardiology

## 2015-12-05 ENCOUNTER — Encounter: Payer: Self-pay | Admitting: Cardiology

## 2015-12-05 VITALS — BP 130/72 | HR 54 | Ht 65.0 in | Wt 133.3 lb

## 2015-12-05 DIAGNOSIS — R072 Precordial pain: Secondary | ICD-10-CM | POA: Diagnosis not present

## 2015-12-05 NOTE — Assessment & Plan Note (Addendum)
Patient has chronic atypical chest pain. Electrocardiogram shows no ST changes. Most recent nuclear study negative. No plans for further ischemia evaluation at this point.

## 2015-12-05 NOTE — Assessment & Plan Note (Signed)
Continue aspirin and statin. Followed by vascular surgery. 

## 2015-12-05 NOTE — Assessment & Plan Note (Signed)
Continue aspirin and statin. 

## 2015-12-05 NOTE — Assessment & Plan Note (Signed)
Continue statin. 

## 2015-12-05 NOTE — Patient Instructions (Signed)
Your physician wants you to follow-up in: ONE YEAR WITH DR CRENSHAW You will receive a reminder letter in the mail two months in advance. If you don't receive a letter, please call our office to schedule the follow-up appointment.   If you need a refill on your cardiac medications before your next appointment, please call your pharmacy.  

## 2016-02-04 ENCOUNTER — Other Ambulatory Visit: Payer: Self-pay | Admitting: Internal Medicine

## 2016-02-04 NOTE — Telephone Encounter (Signed)
Done hardcopy to Corinne  

## 2016-02-04 NOTE — Telephone Encounter (Signed)
Please advise 

## 2016-02-06 ENCOUNTER — Encounter: Payer: Self-pay | Admitting: Internal Medicine

## 2016-02-06 ENCOUNTER — Ambulatory Visit (INDEPENDENT_AMBULATORY_CARE_PROVIDER_SITE_OTHER): Payer: Medicare Other | Admitting: Internal Medicine

## 2016-02-06 VITALS — BP 124/80 | HR 64 | Temp 98.1°F | Resp 20 | Wt 136.0 lb

## 2016-02-06 DIAGNOSIS — R7302 Impaired glucose tolerance (oral): Secondary | ICD-10-CM | POA: Diagnosis not present

## 2016-02-06 DIAGNOSIS — E785 Hyperlipidemia, unspecified: Secondary | ICD-10-CM | POA: Diagnosis not present

## 2016-02-06 DIAGNOSIS — I2510 Atherosclerotic heart disease of native coronary artery without angina pectoris: Secondary | ICD-10-CM | POA: Diagnosis not present

## 2016-02-06 DIAGNOSIS — M79605 Pain in left leg: Secondary | ICD-10-CM

## 2016-02-06 DIAGNOSIS — I739 Peripheral vascular disease, unspecified: Secondary | ICD-10-CM

## 2016-02-06 MED ORDER — TRAMADOL HCL 50 MG PO TABS: ORAL_TABLET | ORAL | Status: DC

## 2016-02-06 MED ORDER — TIZANIDINE HCL 4 MG PO TABS: ORAL_TABLET | ORAL | Status: DC

## 2016-02-06 MED ORDER — ATORVASTATIN CALCIUM 80 MG PO TABS: 80.0000 mg | ORAL_TABLET | Freq: Every day | ORAL | Status: DC

## 2016-02-06 MED ORDER — GABAPENTIN 300 MG PO CAPS: 300.0000 mg | ORAL_CAPSULE | Freq: Three times a day (TID) | ORAL | Status: DC

## 2016-02-06 NOTE — Assessment & Plan Note (Signed)
stable overall by history and exam, recent data reviewed with pt, and pt to continue medical treatment as before,  to f/u any worsening symptoms or concerns Lab Results  Component Value Date   WBC 5.0 08/15/2015   HGB 13.8 08/15/2015   HCT 42.1 08/15/2015   PLT 219 08/15/2015   GLUCOSE 110* 08/15/2015   CHOL 124* 06/27/2015   TRIG 48 06/27/2015   HDL 47 06/27/2015   LDLCALC 67 06/27/2015   ALT 16* 08/15/2015   AST 24 08/15/2015   NA 139 08/15/2015   K 4.3 08/15/2015   CL 105 08/15/2015   CREATININE 1.02 08/15/2015   BUN 15 08/15/2015   CO2 27 08/15/2015   TSH 2.447 06/27/2015   PSA 0.62 04/27/2013   INR 1.03 08/15/2015

## 2016-02-06 NOTE — Assessment & Plan Note (Signed)
stable overall by history and exam, recent data reviewed with pt, and pt to continue medical treatment as before,  to f/u any worsening symptoms or concerns Lab Results  Component Value Date   LDLCALC 67 06/27/2015   Declines f/u lab today

## 2016-02-06 NOTE — Assessment & Plan Note (Signed)
Chronic, for ultram/gabapentin to continue stable,  to f/u any worsening symptoms or concerns

## 2016-02-06 NOTE — Progress Notes (Signed)
Subjective:    Patient ID: Willie Kennedy, male    DOB: Jan 11, 1951, 65 y.o.   MRN: [id number]  HPI  Here to f/u; overall doing ok,  Pt denies chest pain, increasing sob or doe, wheezing, orthopnea, PND, increased LE swelling, palpitations, dizziness or syncope.  Pt denies new neurological symptoms such as new headache, or facial or extremity weakness or numbness.  Pt denies polydipsia, polyuria, or low sugar episode.   Pt denies new neurological symptoms such as new headache, or facial or extremity weakness or numbness.   Pt states overall good compliance with meds, mostly trying to follow appropriate diet, with wt overall stable,  but little exercise however. Cont's tramadol for persistent left leg nerve pain.  No current complaints Past Medical History  Diagnosis Date  . Hyperlipidemia   . Biceps tendon rupture     h/o Right biceps tendon rupture  . Osteoarthritis   . CAD (coronary artery disease)     a. ETT myoview (10/11) with EF 52%, reversible inferior perfusion defect, 1 mm inferolateral ST depression with exercise, 7"16" and stopped due to dyspnea and leg weakness -> LHC (10/11), EF 55%, 40% pRCA, 40% mRCA, 40% prox to mid CFX. medical treatment.  . Impaired glucose tolerance   . Peripheral vascular disease ([place]) 2005    a. s/p left fem-pop Dr.Lawson in 2005.  Marland Kitchen Shortness of breath dyspnea     walking distances or climbing stairs  . History of bronchitis   . Cancer ([place])     skin cancer behind left ear   . Numbness and tingling in left arm     if holds arm upward too long pt states his primary MD is aware  . Headache     with bright light   . Tinnitus   . Hard of hearing     more per right ear  . Dizziness     with bending over    Past Surgical History  Procedure Laterality Date  . Ds/p left femoral artery occlusion  2005    s/p left fem-pop bypass- Dr. Kellie Simmering  . S/p right bicep tendon rupture repair    . Pr vein bypass graft,aorto-fem-pop    . Ventral hernia repair N/A  08/22/2015    Procedure: OPEN VENTRAL HERNIA REPAIR WITH MESH;  Surgeon: Jackolyn Confer, MD;  Location: [facility];  Service: General;  Laterality: N/A;    reports that he quit smoking about 21 years ago. His smoking use included Cigarettes. He has a 280 pack-year smoking history. He has never used smokeless tobacco. He reports that he does not drink alcohol or use illicit drugs. family history includes Cancer in his father and mother; Cervical cancer in his mother; Lung cancer in his father. No Known Allergies Current Outpatient Prescriptions on File Prior to Visit  Medication Sig Dispense Refill  . aspirin 81 MG tablet Take 81 mg by mouth daily.     . nitroGLYCERIN (NITROSTAT) 0.4 MG SL tablet Place 1 tablet (0.4 mg total) under the tongue every 5 (five) minutes as needed. Place 1 tablet under tongue for chest pain -- not to exceed 3 in 15 minutes 100 tablet 11   No current facility-administered medications on file prior to visit.     Review of Systems  Constitutional: Negative for unusual diaphoresis or night sweats HENT: Negative for ringing in ear or discharge Eyes: Negative for double vision or worsening visual disturbance.  Respiratory: Negative for choking and stridor.   Gastrointestinal: Negative  for vomiting or other signifcant bowel change Genitourinary: Negative for hematuria or change in urine volume.  Musculoskeletal: Negative for other MSK pain or swelling Skin: Negative for color change and worsening wound.  Neurological: Negative for tremors and numbness other than noted  Psychiatric/Behavioral: Negative for decreased concentration or agitation other than above       Objective:   Physical Exam BP 124/80 mmHg  Pulse 64  Temp(Src) 98.1 F (36.7 C) (Oral)  Resp 20  Wt 136 lb (61.689 kg)  SpO2 97% VS noted,  Constitutional: Pt appears in no significant distress HENT: Head: NCAT.  Right Ear: External ear normal.  Left Ear: External ear normal.  Eyes: . Pupils are  equal, round, and reactive to light. Conjunctivae and EOM are normal Neck: Normal range of motion. Neck supple.  Cardiovascular: Normal rate and regular rhythm.   Pulmonary/Chest: Effort normal and breath sounds without rales or wheezing.  Abd:  Soft, NT, ND, + BS Neurological: Pt is alert. Not confused , motor grossly intact Skin: Skin is warm. No rash, Trace LLE edema with tender to touch Psychiatric: Pt behavior is normal. No agitation.     Assessment & Plan:

## 2016-02-06 NOTE — Patient Instructions (Signed)
Please continue all other medications as before, and refills have been done if requested.  Please have the pharmacy call with any other refills you may need.  Please continue your efforts at being more active, low cholesterol diet, and weight control.  You are otherwise up to date with prevention measures today.  Please keep your appointments with your specialists as you may have planned  Please return in 6 months, or sooner if needed 

## 2016-02-06 NOTE — Progress Notes (Signed)
Pre visit review using our clinic review tool, if applicable. No additional management support is needed unless otherwise documented below in the visit note. 

## 2016-04-14 DIAGNOSIS — M1990 Unspecified osteoarthritis, unspecified site: Secondary | ICD-10-CM | POA: Diagnosis not present

## 2016-04-14 DIAGNOSIS — E7800 Pure hypercholesterolemia, unspecified: Secondary | ICD-10-CM | POA: Diagnosis not present

## 2016-04-14 DIAGNOSIS — G629 Polyneuropathy, unspecified: Secondary | ICD-10-CM | POA: Diagnosis not present

## 2016-04-14 DIAGNOSIS — I159 Secondary hypertension, unspecified: Secondary | ICD-10-CM | POA: Diagnosis not present

## 2016-04-27 DIAGNOSIS — M25561 Pain in right knee: Secondary | ICD-10-CM | POA: Diagnosis not present

## 2016-04-27 DIAGNOSIS — S6982XA Other specified injuries of left wrist, hand and finger(s), initial encounter: Secondary | ICD-10-CM | POA: Diagnosis not present

## 2016-04-27 DIAGNOSIS — M25562 Pain in left knee: Secondary | ICD-10-CM | POA: Diagnosis not present

## 2016-04-28 DIAGNOSIS — G629 Polyneuropathy, unspecified: Secondary | ICD-10-CM | POA: Diagnosis not present

## 2016-04-28 DIAGNOSIS — Z125 Encounter for screening for malignant neoplasm of prostate: Secondary | ICD-10-CM | POA: Diagnosis not present

## 2016-04-28 DIAGNOSIS — Z79899 Other long term (current) drug therapy: Secondary | ICD-10-CM | POA: Diagnosis not present

## 2016-04-28 DIAGNOSIS — E7800 Pure hypercholesterolemia, unspecified: Secondary | ICD-10-CM | POA: Diagnosis not present

## 2016-04-28 DIAGNOSIS — I159 Secondary hypertension, unspecified: Secondary | ICD-10-CM | POA: Diagnosis not present

## 2016-05-05 DIAGNOSIS — R7303 Prediabetes: Secondary | ICD-10-CM | POA: Diagnosis not present

## 2016-05-05 DIAGNOSIS — J329 Chronic sinusitis, unspecified: Secondary | ICD-10-CM | POA: Diagnosis not present

## 2016-05-05 DIAGNOSIS — G629 Polyneuropathy, unspecified: Secondary | ICD-10-CM | POA: Diagnosis not present

## 2016-05-20 ENCOUNTER — Encounter: Payer: Self-pay | Admitting: Cardiology

## 2016-05-21 ENCOUNTER — Telehealth: Payer: Self-pay | Admitting: *Deleted

## 2016-05-21 DIAGNOSIS — E785 Hyperlipidemia, unspecified: Secondary | ICD-10-CM

## 2016-05-21 MED ORDER — ATORVASTATIN CALCIUM 80 MG PO TABS: 40.0000 mg | ORAL_TABLET | Freq: Every day | ORAL | Status: DC

## 2016-05-21 NOTE — Telephone Encounter (Signed)
-----   Message from Lelon Perla, MD sent at 05/20/2016  4:31 PM EDT ----- Decrease lipitor to 40 mg daily; lipids, liver and ck in 6 weeks Kirk [facility]s

## 2016-05-21 NOTE — Telephone Encounter (Signed)
Spoke with pt, aware of lab results.  He voice understanding to cut lipitor in 1/2. Lab orders will be mailed to the pt. His primary care has referred him to a rheumatologist.

## 2016-06-21 DIAGNOSIS — G629 Polyneuropathy, unspecified: Secondary | ICD-10-CM | POA: Diagnosis not present

## 2016-06-21 DIAGNOSIS — F329 Major depressive disorder, single episode, unspecified: Secondary | ICD-10-CM | POA: Diagnosis not present

## 2016-06-24 DIAGNOSIS — M791 Myalgia: Secondary | ICD-10-CM | POA: Diagnosis not present

## 2016-06-24 DIAGNOSIS — M79641 Pain in right hand: Secondary | ICD-10-CM | POA: Diagnosis not present

## 2016-06-24 DIAGNOSIS — R748 Abnormal levels of other serum enzymes: Secondary | ICD-10-CM | POA: Diagnosis not present

## 2016-06-24 DIAGNOSIS — M1812 Unilateral primary osteoarthritis of first carpometacarpal joint, left hand: Secondary | ICD-10-CM | POA: Diagnosis not present

## 2016-06-24 DIAGNOSIS — M79643 Pain in unspecified hand: Secondary | ICD-10-CM | POA: Diagnosis not present

## 2016-07-06 DIAGNOSIS — I2510 Atherosclerotic heart disease of native coronary artery without angina pectoris: Secondary | ICD-10-CM | POA: Diagnosis not present

## 2016-07-06 DIAGNOSIS — F329 Major depressive disorder, single episode, unspecified: Secondary | ICD-10-CM | POA: Diagnosis not present

## 2016-07-06 DIAGNOSIS — R748 Abnormal levels of other serum enzymes: Secondary | ICD-10-CM | POA: Diagnosis not present

## 2016-07-06 DIAGNOSIS — G629 Polyneuropathy, unspecified: Secondary | ICD-10-CM | POA: Diagnosis not present

## 2016-07-12 DIAGNOSIS — M179 Osteoarthritis of knee, unspecified: Secondary | ICD-10-CM | POA: Diagnosis not present

## 2016-07-19 DIAGNOSIS — S0502XA Injury of conjunctiva and corneal abrasion without foreign body, left eye, initial encounter: Secondary | ICD-10-CM | POA: Diagnosis not present

## 2016-07-19 DIAGNOSIS — H2513 Age-related nuclear cataract, bilateral: Secondary | ICD-10-CM | POA: Diagnosis not present

## 2016-07-20 DIAGNOSIS — H2513 Age-related nuclear cataract, bilateral: Secondary | ICD-10-CM | POA: Diagnosis not present

## 2016-07-20 DIAGNOSIS — S0502XA Injury of conjunctiva and corneal abrasion without foreign body, left eye, initial encounter: Secondary | ICD-10-CM | POA: Diagnosis not present

## 2016-07-29 DIAGNOSIS — R410 Disorientation, unspecified: Secondary | ICD-10-CM | POA: Diagnosis not present

## 2016-07-29 DIAGNOSIS — Z23 Encounter for immunization: Secondary | ICD-10-CM | POA: Diagnosis not present

## 2016-07-29 DIAGNOSIS — F329 Major depressive disorder, single episode, unspecified: Secondary | ICD-10-CM | POA: Diagnosis not present

## 2016-07-29 DIAGNOSIS — R7303 Prediabetes: Secondary | ICD-10-CM | POA: Diagnosis not present

## 2016-07-29 DIAGNOSIS — R748 Abnormal levels of other serum enzymes: Secondary | ICD-10-CM | POA: Diagnosis not present

## 2016-08-06 ENCOUNTER — Encounter: Payer: Self-pay | Admitting: Neurology

## 2016-08-06 ENCOUNTER — Ambulatory Visit (INDEPENDENT_AMBULATORY_CARE_PROVIDER_SITE_OTHER): Payer: Medicare Other | Admitting: Neurology

## 2016-08-06 ENCOUNTER — Other Ambulatory Visit: Payer: Self-pay | Admitting: Neurology

## 2016-08-06 VITALS — BP 134/78 | HR 64 | Temp 97.8°F | Ht 64.5 in | Wt 136.2 lb

## 2016-08-06 DIAGNOSIS — I2510 Atherosclerotic heart disease of native coronary artery without angina pectoris: Secondary | ICD-10-CM

## 2016-08-06 DIAGNOSIS — T1590XA Foreign body on external eye, part unspecified, unspecified eye, initial encounter: Secondary | ICD-10-CM

## 2016-08-06 DIAGNOSIS — R252 Cramp and spasm: Secondary | ICD-10-CM | POA: Diagnosis not present

## 2016-08-06 DIAGNOSIS — R404 Transient alteration of awareness: Secondary | ICD-10-CM | POA: Diagnosis not present

## 2016-08-06 DIAGNOSIS — R292 Abnormal reflex: Secondary | ICD-10-CM | POA: Diagnosis not present

## 2016-08-06 NOTE — Addendum Note (Signed)
Addended by: Feliberto Gottron on: 08/06/2016 04:27 PM   Modules accepted: Orders

## 2016-08-06 NOTE — Patient Instructions (Signed)
1. Schedule MRI brain with and without contrast 2. Schedule MRI cervical spine without contrast 3 Schedule 1-hour sleep-deprived EEG 4. As per [place] driving laws, for any episode of loss of awareness, no driving until 6 months event-free 5. Follow-up in 2 months, call for any changes
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
## 2016-08-10 DIAGNOSIS — F329 Major depressive disorder, single episode, unspecified: Secondary | ICD-10-CM | POA: Diagnosis not present

## 2016-08-10 DIAGNOSIS — E785 Hyperlipidemia, unspecified: Secondary | ICD-10-CM | POA: Diagnosis not present

## 2016-08-10 DIAGNOSIS — G629 Polyneuropathy, unspecified: Secondary | ICD-10-CM | POA: Diagnosis not present

## 2016-08-10 DIAGNOSIS — R748 Abnormal levels of other serum enzymes: Secondary | ICD-10-CM | POA: Diagnosis not present

## 2016-08-11 DIAGNOSIS — Z0000 Encounter for general adult medical examination without abnormal findings: Secondary | ICD-10-CM | POA: Diagnosis not present

## 2016-08-11 DIAGNOSIS — E7800 Pure hypercholesterolemia, unspecified: Secondary | ICD-10-CM | POA: Diagnosis not present

## 2016-08-11 DIAGNOSIS — R7303 Prediabetes: Secondary | ICD-10-CM | POA: Diagnosis not present

## 2016-08-11 DIAGNOSIS — R748 Abnormal levels of other serum enzymes: Secondary | ICD-10-CM | POA: Diagnosis not present

## 2016-08-17 ENCOUNTER — Ambulatory Visit
Admission: RE | Admit: 2016-08-17 | Discharge: 2016-08-17 | Disposition: A | Discharge status: Home / Self Care | Payer: Medicare Other | Source: Ambulatory Visit | Attending: Neurology | Admitting: Neurology

## 2016-08-17 ENCOUNTER — Other Ambulatory Visit: Payer: Self-pay

## 2016-08-17 DIAGNOSIS — T1590XA Foreign body on external eye, part unspecified, unspecified eye, initial encounter: Secondary | ICD-10-CM

## 2016-08-17 DIAGNOSIS — R404 Transient alteration of awareness: Secondary | ICD-10-CM

## 2016-08-17 DIAGNOSIS — R2689 Other abnormalities of gait and mobility: Secondary | ICD-10-CM | POA: Diagnosis not present

## 2016-08-17 DIAGNOSIS — R42 Dizziness and giddiness: Secondary | ICD-10-CM | POA: Diagnosis not present

## 2016-08-17 DIAGNOSIS — R292 Abnormal reflex: Secondary | ICD-10-CM

## 2016-08-17 DIAGNOSIS — Z01818 Encounter for other preprocedural examination: Secondary | ICD-10-CM | POA: Diagnosis not present

## 2016-08-17 DIAGNOSIS — M50223 Other cervical disc displacement at C6-C7 level: Secondary | ICD-10-CM | POA: Diagnosis not present

## 2016-08-17 MED ORDER — GADOBENATE DIMEGLUMINE 529 MG/ML IV SOLN
12.0000 mL | Freq: Once | INTRAVENOUS | Status: AC | PRN
  Administered 2016-08-17: 12 mL via INTRAVENOUS

## 2016-08-18 DIAGNOSIS — E7800 Pure hypercholesterolemia, unspecified: Secondary | ICD-10-CM | POA: Diagnosis not present

## 2016-08-18 DIAGNOSIS — I2510 Atherosclerotic heart disease of native coronary artery without angina pectoris: Secondary | ICD-10-CM | POA: Diagnosis not present

## 2016-08-18 DIAGNOSIS — R748 Abnormal levels of other serum enzymes: Secondary | ICD-10-CM | POA: Diagnosis not present

## 2016-08-18 DIAGNOSIS — F329 Major depressive disorder, single episode, unspecified: Secondary | ICD-10-CM | POA: Diagnosis not present

## 2016-08-20 ENCOUNTER — Encounter: Payer: Self-pay | Admitting: Vascular Surgery

## 2016-08-25 ENCOUNTER — Ambulatory Visit (INDEPENDENT_AMBULATORY_CARE_PROVIDER_SITE_OTHER)
Admission: RE | Admit: 2016-08-25 | Discharge: 2016-08-25 | Disposition: A | Discharge status: Home / Self Care | Payer: Medicare Other | Source: Ambulatory Visit

## 2016-08-25 ENCOUNTER — Encounter: Payer: Self-pay | Admitting: Vascular Surgery

## 2016-08-25 ENCOUNTER — Ambulatory Visit (INDEPENDENT_AMBULATORY_CARE_PROVIDER_SITE_OTHER): Payer: Medicare Other | Admitting: Vascular Surgery

## 2016-08-25 ENCOUNTER — Telehealth: Payer: Self-pay | Admitting: Neurology

## 2016-08-25 ENCOUNTER — Ambulatory Visit (HOSPITAL_COMMUNITY)
Admission: RE | Admit: 2016-08-25 | Discharge: 2016-08-25 | Disposition: A | Discharge status: Home / Self Care | Payer: Medicare Other | Source: Ambulatory Visit | Attending: Vascular Surgery | Admitting: Vascular Surgery

## 2016-08-25 VITALS — BP 125/78 | HR 70 | Temp 97.9°F | Resp 18 | Ht 64.5 in | Wt 132.9 lb

## 2016-08-25 DIAGNOSIS — I2510 Atherosclerotic heart disease of native coronary artery without angina pectoris: Secondary | ICD-10-CM | POA: Diagnosis not present

## 2016-08-25 DIAGNOSIS — Z48812 Encounter for surgical aftercare following surgery on the circulatory system: Secondary | ICD-10-CM | POA: Diagnosis not present

## 2016-08-25 DIAGNOSIS — I739 Peripheral vascular disease, unspecified: Secondary | ICD-10-CM

## 2016-08-25 NOTE — Progress Notes (Signed)
HISTORY AND PHYSICAL     CC:  F/u bypass graft Referring Provider:  Biagio Borg, MD  HPI: This is a 65 y.o. male left femoral to popliteal bypass with greater saphenous vein by Dr. Kellie Simmering in 2005.  He is doing well as far as his bypass goes.  He states that he does have some irritation in the left foot from nerve damage that can keep him up at night.  If he takes his gabapentin and Tramadol by 1530 daily, it does not keep him up at night.  He does not have any cramping in his legs when he walks.    He recently had some visual disturbances if he looks at something bright, he is not able to focus and see for about 45 minutes to an hour. He has had memory loss where he knows the person or his dogs, but cannot remember the names.  He has been evaluated & followed by Neurology.    He is a former smoker and quit in 1996.  His wife is handicapped and he takes care of her.  He takes a daily aspirin.  He does not take a statin as he does not tolerate this as he "doesn't have any energy".    Past Medical History:  Diagnosis Date  . Biceps tendon rupture    h/o Right biceps tendon rupture  . CAD (coronary artery disease)    a. ETT myoview (10/11) with EF 52%, reversible inferior perfusion defect, 1 mm inferolateral ST depression with exercise, 7"16" and stopped due to dyspnea and leg weakness -> LHC (10/11), EF 55%, 40% pRCA, 40% mRCA, 40% prox to mid CFX. medical treatment.  . Cancer ([place])    skin cancer behind left ear   . Dizziness    with bending over   . Hard of hearing    more per right ear  . Headache    with bright light   . History of bronchitis   . Hyperlipidemia   . Impaired glucose tolerance   . Numbness and tingling in left arm    if holds arm upward too long pt states his primary MD is aware  . Osteoarthritis   . Peripheral vascular disease ([place]) 2005   a. s/p left fem-pop Dr.Lawson in 2005.  Marland Kitchen Shortness of breath dyspnea    walking distances or climbing stairs  .  Tinnitus     Past Surgical History:  Procedure Laterality Date  . ds/p left femoral artery occlusion  2005   s/p left fem-pop bypass- Dr. Kellie Simmering  . PR VEIN BYPASS GRAFT,AORTO-FEM-POP    . s/p right bicep tendon rupture repair    . VENTRAL HERNIA REPAIR N/A 08/22/2015   Procedure: OPEN VENTRAL HERNIA REPAIR WITH MESH;  Surgeon: Jackolyn Confer, MD;  Location: [facility];  Service: General;  Laterality: N/A;    No Known Allergies  Current Outpatient Prescriptions  Medication Sig Dispense Refill  . aspirin 81 MG tablet Take 81 mg by mouth daily.     . DULoxetine (CYMBALTA) 60 MG capsule     . gabapentin (NEURONTIN) 300 MG capsule Take 1 capsule (300 mg total) by mouth 3 (three) times daily. 270 capsule 4  . nitroGLYCERIN (NITROSTAT) 0.4 MG SL tablet Place 1 tablet (0.4 mg total) under the tongue every 5 (five) minutes as needed. Place 1 tablet under tongue for chest pain -- not to exceed 3 in 15 minutes 100 tablet 11  . tiZANidine (ZANAFLEX) 4 MG tablet TAKE ONE TABLET  BY MOUTH EVERY 6 HOURS AS NEEDED FOR MUSCLE SPASM 60 tablet 1  . traMADol (ULTRAM) 50 MG tablet TAKE ONE TABLET BY MOUTH EVERY 6 HOURS AS NEEDED WITH IN LIMITS 60 PER MONTH 60 tablet 5   No current facility-administered medications for this visit.     Family History  Problem Relation Age of Onset  . Cervical cancer Mother   . Cancer Mother     cervical  . Lung cancer Father   . Cancer Father     lung    Social History   Social History  . Marital status: Married    Spouse name: N/A  . Number of children: 1  . Years of education: N/A   Occupational History  . Master woood craftsman    Social History Main Topics  . Smoking status: Former Smoker    Packs/day: 7.00    Years: 40.00    Types: Cigarettes    Quit date: 11/29/1994  . Smokeless tobacco: Never Used  . Alcohol use No  . Drug use: No  . Sexual activity: Not on file   Other Topics Concern  . Not on file   Social History Narrative  . No narrative  on file     REVIEW OF SYSTEMS:   [X]  denotes positive finding, [ ]  denotes negative finding Cardiac  Comments:  Chest pain or chest pressure: x Followed by Dr. Stanford Breed  Shortness of breath upon exertion:    Short of breath when lying flat:    Irregular heart rhythm:        Vascular    Pain in calf, thigh, or hip brought on by ambulation:    Pain in feet at night that wakes you up from your sleep:     Blood clot in your veins:    Leg swelling:  x See HPI      Pulmonary    Oxygen at home:    Productive cough:     Wheezing:         Neurologic    Sudden weakness in arms or legs:     Sudden numbness in arms or legs:     Sudden onset of difficulty speaking or slurred speech:    Visual disturbances  x Followed by Neurology  Problems with dizziness:         Gastrointestinal    Blood in stool:     Vomited blood:         Genitourinary    Burning when urinating:     Blood in urine:        Psychiatric    Major depression:         Hematologic    Bleeding problems:    Problems with blood clotting too easily:        Skin    Rashes or ulcers:        Constitutional    Fever or chills:      PHYSICAL EXAMINATION:  Vitals:   08/25/16 1452  BP: 125/78  Pulse: 70  Resp: 18  Temp: 97.9 F (36.6 C)   Body mass index is 22.46 kg/m.  General:  WDWN in NAD; vital signs documented above Gait: Not observed HENT: WNL, normocephalic Pulmonary: normal non-labored breathing , without Rales, rhonchi,  wheezing Cardiac: regular HR, without  Murmurs, rubs or gallops; without carotid bruits Abdomen: soft, NT, no masses Skin: without rashes Vascular Exam/Pulses:  Right Left  Radial 2+ (normal) 2+ (normal)  Femoral 2+ (normal) 2+ (normal)  Popliteal Unable to palpate  2+ (normal)  DP 2+ (normal) 2+ (normal)  PT 2+ (normal) Unable to palpate    Extremities: without ischemic changes, without Gangrene , without cellulitis; without open wounds; spider veins bilaterally on the  foot to above the ankle;  Mild swelling left ankle Musculoskeletal: no muscle wasting or atrophy  Neurologic: A&O X 3;  No focal weakness or paresthesias are detected Psychiatric:  The pt has Normal affect.   Non-Invasive Vascular Imaging:   ABI's 08/25/16: Right:  1.01 Left:  1.11  TBI's 08/25/16: Right:  0.73 Left:  0.63  Previous ABI's 08/12/16: Right:  0.98 Left:  1.07  Pt meds includes: Statin:  No. Not tolerated Beta Blocker:  No. Aspirin:  Yes.   ACEI:  No. ARB:  No. Other Antiplatelet/Anticoagulant:  No.    ASSESSMENT/PLAN:: 65 y.o. male who is s/p left femoral to popliteal bypass with greater saphenous vein by Dr. Kellie Simmering in 2005 who presents for yearly surveillance.    -pt's bypass graft is patent and he does have palpable pedal pulses. -he does have some swelling in his ankles with the left greater than right-Dr. Scot Dock has discussed proper leg elevation with the pt.   -continue daily aspirn -pt is not on a statin due to intolerance -continue staying active and walking everyday.   -neuropathy left foot per PCP -f/u with Dr. Scot Dock in one year with duplex.  He will call sooner if needed.    Leontine Locket, PA-C [facility] [phone]  Clinic MD:  Pt seen and examined in conjunction with Dr. Scot Dock

## 2016-08-25 NOTE — Telephone Encounter (Signed)
PT's wife called and has a question concerning him not being able to look at the sun and getting headaches and not even knowing his dogs name/Dawn  CB# [phone]

## 2016-08-27 NOTE — Telephone Encounter (Signed)
1-Hour sleep-deprived EEG scheduled for 09/08/16 at 7:30. Pt notified.

## 2016-08-27 NOTE — Telephone Encounter (Signed)
PT returned call and she is wanting test results/Dawn CB#[phone]

## 2016-08-27 NOTE — Telephone Encounter (Signed)
Spoke to wife and clarified results. Would like to proceed with 1-hour sleep-deprived EEG. It has been ordered but I don't see it scheduled, can you pls follow-up on scheduling? Thanks!

## 2016-08-27 NOTE — Telephone Encounter (Signed)
Please advise 

## 2016-08-27 NOTE — Telephone Encounter (Signed)
Returned call to pt's wife [name]. She wanted me to clarify pt's test results. Reviewed results, pts wife verbalized understanding.

## 2016-08-30 ENCOUNTER — Telehealth: Payer: Self-pay | Admitting: Cardiology

## 2016-08-30 NOTE — Telephone Encounter (Signed)
New message    Wife calling patient has upcoming appt on 10.4.2017 with Dr. Stanford Breed need lab work put into the system prior to appt

## 2016-08-30 NOTE — Telephone Encounter (Signed)
Active orders in system. Advised to present to [facility] for fasting labs, instructions and directions given. Caller verbalized understanding and thanks.

## 2016-08-31 ENCOUNTER — Encounter: Payer: Self-pay | Admitting: Cardiology

## 2016-08-31 NOTE — Addendum Note (Signed)
Addended by: Kaleen Mask on: 08/31/2016 11:00 AM   Modules accepted: Orders

## 2016-09-01 ENCOUNTER — Ambulatory Visit (INDEPENDENT_AMBULATORY_CARE_PROVIDER_SITE_OTHER): Payer: Medicare Other | Admitting: Cardiology

## 2016-09-01 ENCOUNTER — Encounter: Payer: Self-pay | Admitting: Cardiology

## 2016-09-01 VITALS — BP 120/82 | HR 58 | Ht 64.5 in | Wt 135.6 lb

## 2016-09-01 DIAGNOSIS — R072 Precordial pain: Secondary | ICD-10-CM

## 2016-09-01 DIAGNOSIS — I2510 Atherosclerotic heart disease of native coronary artery without angina pectoris: Secondary | ICD-10-CM | POA: Diagnosis not present

## 2016-09-01 DIAGNOSIS — E784 Other hyperlipidemia: Secondary | ICD-10-CM | POA: Diagnosis not present

## 2016-09-01 DIAGNOSIS — I10 Essential (primary) hypertension: Secondary | ICD-10-CM | POA: Diagnosis not present

## 2016-09-01 DIAGNOSIS — E7849 Other hyperlipidemia: Secondary | ICD-10-CM

## 2016-09-01 MED ORDER — EZETIMIBE 10 MG PO TABS: 10.0000 mg | ORAL_TABLET | Freq: Every day | ORAL | 3 refills | Status: DC

## 2016-09-01 NOTE — Progress Notes (Signed)
HPI: FU nonobstructive CAD, PVD s/p previous left fem-pop in 2005 followed by vascular, h/o longstanding brief atypical chest pains. H/o nuc in 2011 showing EF 52% and reversible inferior defect. Subsequent cath 10/11 showed 40% prox RCA, 40% mid RCA, 50% prox-to-mid Cx, EF 55% with some basal inferior HK. Screening AAA duplex negative for aneurysm 09/2014. Nuclear study repeated August 2016 and showed ejection fraction 43%. There was normal perfusion. Echocardiogram August 2016 showed normal LV function with ejection fraction 60-65% and grade 1 diastolic dysfunction. LDL July 2017 132. Since last seen, he has dyspnea with more extreme activities. Occasional pain under the left breast but no exertional symptoms. His chest pain is unchanged. No orthopnea, PND or pedal edema. No syncope.  Current Outpatient Prescriptions  Medication Sig Dispense Refill  . aspirin 81 MG tablet Take 81 mg by mouth daily.     . DULoxetine (CYMBALTA) 60 MG capsule     . gabapentin (NEURONTIN) 300 MG capsule Take 1 capsule (300 mg total) by mouth 3 (three) times daily. 270 capsule 4  . nitroGLYCERIN (NITROSTAT) 0.4 MG SL tablet Place 1 tablet (0.4 mg total) under the tongue every 5 (five) minutes as needed. Place 1 tablet under tongue for chest pain -- not to exceed 3 in 15 minutes 100 tablet 11  . tiZANidine (ZANAFLEX) 4 MG tablet TAKE ONE TABLET BY MOUTH EVERY 6 HOURS AS NEEDED FOR MUSCLE SPASM 60 tablet 1  . traMADol (ULTRAM) 50 MG tablet TAKE ONE TABLET BY MOUTH EVERY 6 HOURS AS NEEDED WITH IN LIMITS 60 PER MONTH 60 tablet 5   No current facility-administered medications for this visit.      Past Medical History:  Diagnosis Date  . Biceps tendon rupture    h/o Right biceps tendon rupture  . CAD (coronary artery disease)    a. ETT myoview (10/11) with EF 52%, reversible inferior perfusion defect, 1 mm inferolateral ST depression with exercise, 7"16" and stopped due to dyspnea and leg weakness -> LHC  (10/11), EF 55%, 40% pRCA, 40% mRCA, 40% prox to mid CFX. medical treatment.  . Cancer ([place])    skin cancer behind left ear   . Dizziness    with bending over   . Hard of hearing    more per right ear  . Headache    with bright light   . History of bronchitis   . Hyperlipidemia   . Impaired glucose tolerance   . Numbness and tingling in left arm    if holds arm upward too long pt states his primary MD is aware  . Osteoarthritis   . Peripheral vascular disease ([place]) 2005   a. s/p left fem-pop Dr.Lawson in 2005.  Marland Kitchen Shortness of breath dyspnea    walking distances or climbing stairs  . Tinnitus     Past Surgical History:  Procedure Laterality Date  . ds/p left femoral artery occlusion  2005   s/p left fem-pop bypass- Dr. Kellie Simmering  . PR VEIN BYPASS GRAFT,AORTO-FEM-POP    . s/p right bicep tendon rupture repair    . VENTRAL HERNIA REPAIR N/A 08/22/2015   Procedure: OPEN VENTRAL HERNIA REPAIR WITH MESH;  Surgeon: Jackolyn Confer, MD;  Location: [facility];  Service: General;  Laterality: N/A;    Social History   Social History  . Marital status: Married    Spouse name: N/A  . Number of children: 1  . Years of education: N/A   Occupational History  . Master  woood craftsman    Social History Main Topics  . Smoking status: Former Smoker    Packs/day: 7.00    Years: 40.00    Types: Cigarettes    Quit date: 11/29/1994  . Smokeless tobacco: Never Used  . Alcohol use No  . Drug use: No  . Sexual activity: Not on file   Other Topics Concern  . Not on file   Social History Narrative  . No narrative on file    Family History  Problem Relation Age of Onset  . Cervical cancer Mother   . Cancer Mother     cervical  . Lung cancer Father   . Cancer Father     lung    ROS: no fevers or chills, productive cough, hemoptysis, dysphasia, odynophagia, melena, hematochezia, dysuria, hematuria, rash, seizure activity, orthopnea, PND, pedal edema, claudication. Remaining systems are  negative.  Physical Exam: Well-developed well-nourished in no acute distress.  Skin is warm and dry.  HEENT is normal.  Neck is supple.  Chest is clear to auscultation with normal expansion.  Cardiovascular exam is regular rate and rhythm.  Abdominal exam nontender or distended. No masses palpated. Extremities show no edema. neuro grossly intact  ECG-Sinus rhythm at a rate of 58. Inferior T-wave inversion. T-wave inversion slightly worse compared to previous.  A/P  1 coronary artery disease-continue aspirin. Intolerant to statins.  2 Peripheral vascular disease-continue aspirin. Followed by vascular surgery.  3 hyperlipidemia-intolerant to statins. Recent LDL 132. We will try Zetia 10 mg daily. Check lipids, liver and CK in 4 weeks.  4 chest pain-symptoms are atypical and chronic. Electrocardiogram shows no diagnostic ST changes. We will not pursue further ischemia evaluation.  Kirk Ruths, MD

## 2016-09-01 NOTE — Patient Instructions (Signed)
Medication Instructions:   START ZETIA 10 MG ONCE DAILY  Labwork:  Your physician recommends that you return for lab work in: [place] = DO NOT EAT PRIOR TO LAB WORK  Follow-Up:  Your physician wants you to follow-up in: [place] will receive a reminder letter in the mail two months in advance. If you don't receive a letter, please call our office to schedule the follow-up appointment.   If you need a refill on your cardiac medications before your next appointment, please call your pharmacy.

## 2016-09-08 ENCOUNTER — Ambulatory Visit (INDEPENDENT_AMBULATORY_CARE_PROVIDER_SITE_OTHER): Payer: Medicare Other | Admitting: Neurology

## 2016-09-08 ENCOUNTER — Other Ambulatory Visit: Payer: Self-pay

## 2016-09-08 DIAGNOSIS — R404 Transient alteration of awareness: Secondary | ICD-10-CM | POA: Diagnosis not present

## 2016-09-08 DIAGNOSIS — R292 Abnormal reflex: Secondary | ICD-10-CM

## 2016-09-08 DIAGNOSIS — R252 Cramp and spasm: Secondary | ICD-10-CM

## 2016-09-08 DIAGNOSIS — I739 Peripheral vascular disease, unspecified: Secondary | ICD-10-CM

## 2016-09-20 ENCOUNTER — Ambulatory Visit (INDEPENDENT_AMBULATORY_CARE_PROVIDER_SITE_OTHER): Payer: Medicare Other | Admitting: Neurology

## 2016-09-20 DIAGNOSIS — R7303 Prediabetes: Secondary | ICD-10-CM | POA: Diagnosis not present

## 2016-09-20 DIAGNOSIS — I739 Peripheral vascular disease, unspecified: Secondary | ICD-10-CM

## 2016-09-20 DIAGNOSIS — E7800 Pure hypercholesterolemia, unspecified: Secondary | ICD-10-CM | POA: Diagnosis not present

## 2016-09-20 DIAGNOSIS — R410 Disorientation, unspecified: Secondary | ICD-10-CM

## 2016-09-23 ENCOUNTER — Telehealth: Payer: Self-pay

## 2016-09-23 DIAGNOSIS — R748 Abnormal levels of other serum enzymes: Secondary | ICD-10-CM | POA: Diagnosis not present

## 2016-09-23 NOTE — Telephone Encounter (Signed)
Notified patient's wife of below.

## 2016-09-23 NOTE — Telephone Encounter (Signed)
Pls let her know we will review it first and decide from there, because insurance may not pay for it any more. Thanks

## 2016-09-23 NOTE — Telephone Encounter (Signed)
Contacted patient and his wife and they stated, "the test need might need done again because it was a cloudy day the day he had come in for testing and his symptoms are only triggered with the sun and bright lights."

## 2016-09-23 NOTE — Telephone Encounter (Signed)
Patient's wife called [place] at 4:42 PM on 09/22/16. Initial comment: Caller stated wanted to leave a message for Dr. Amparo Bristol nurse in regards to her husband.  Additional Comment: Stated she thinks her husband needs to have test again to test for his eye attacks.

## 2016-09-28 ENCOUNTER — Ambulatory Visit: Payer: Self-pay | Admitting: Neurology

## 2016-09-29 NOTE — Procedures (Signed)
ELECTROENCEPHALOGRAM REPORT  Date of Study: 09/08/2016  Patient's Name: Willie Kennedy MRN: [id number] Date of Birth: 12/20/1950  Referring Provider: Dr. Ellouise Newer  Clinical History: This is a 65 year old man with recurrent stereotyped episodes of blurred vision and confusion/forgetfulness when he looks at bright white lights or objects. EEG for classification.  Medications: Neurontin, Aspirin, Lipitor, Nitrostat, Zanaflex, Ultram  Technical Summary: A multichannel digital 1-hour sleep-deprived EEG recording measured by the international 10-20 system with electrodes applied with paste and impedances below 5000 ohms performed in our laboratory with EKG monitoring in an awake and asleep patient.  Hyperventilation and photic stimulation were performed.  The digital EEG was referentially recorded, reformatted, and digitally filtered in a variety of bipolar and referential montages for optimal display.    Description: The patient is awake and asleep during the recording.  During maximal wakefulness, there is a symmetric, medium voltage 10 Hz posterior dominant rhythm that attenuates with eye opening.  The record is symmetric.  During drowsiness and sleep, there is an increase in theta slowing of the background.  Vertex waves and symmetric sleep spindles were seen.  Hyperventilation and photic stimulation did not elicit any abnormalities. While drowsy, he starts having subtle upper body jerks where his head slightly lifts off the pillow. This becomes progressive more intense with leg jerks. Symptoms last for several minutes, he is able to follow commands and speak without difficulty, and symptoms stop while following commands (distracted) and restart after. There were no epileptiform discharges seen with these body jerks.  There were no epileptiform discharges or electrographic seizures seen.    EKG lead was unremarkable.  Impression: This 1-hour awake and asleep EEG is normal. Patient starts  having body jerks in the middle of the recording with no associated epileptiform correlate.  Clinical Correlation: A normal EEG does not exclude a clinical diagnosis of epilepsy. Body jerks were non-epileptic. Typical visual episodes were not captured. If further clinical questions remain, prolonged EEG may be helpful.  Clinical correlation is advised.   Ellouise Newer, M.D.

## 2016-09-30 DIAGNOSIS — I2510 Atherosclerotic heart disease of native coronary artery without angina pectoris: Secondary | ICD-10-CM | POA: Diagnosis not present

## 2016-09-30 DIAGNOSIS — Z789 Other specified health status: Secondary | ICD-10-CM | POA: Diagnosis not present

## 2016-09-30 DIAGNOSIS — I739 Peripheral vascular disease, unspecified: Secondary | ICD-10-CM | POA: Diagnosis not present

## 2016-09-30 DIAGNOSIS — E7800 Pure hypercholesterolemia, unspecified: Secondary | ICD-10-CM | POA: Diagnosis not present

## 2016-10-20 ENCOUNTER — Encounter: Payer: Self-pay | Admitting: Neurology

## 2016-10-20 ENCOUNTER — Ambulatory Visit (INDEPENDENT_AMBULATORY_CARE_PROVIDER_SITE_OTHER): Payer: Medicare Other | Admitting: Neurology

## 2016-10-20 VITALS — BP 110/76 | HR 71 | Ht 64.5 in | Wt 139.1 lb

## 2016-10-20 DIAGNOSIS — G2581 Restless legs syndrome: Secondary | ICD-10-CM | POA: Diagnosis not present

## 2016-10-20 DIAGNOSIS — I739 Peripheral vascular disease, unspecified: Secondary | ICD-10-CM

## 2016-10-20 DIAGNOSIS — G43109 Migraine with aura, not intractable, without status migrainosus: Secondary | ICD-10-CM | POA: Diagnosis not present

## 2016-10-20 DIAGNOSIS — I2510 Atherosclerotic heart disease of native coronary artery without angina pectoris: Secondary | ICD-10-CM | POA: Diagnosis not present

## 2016-10-20 MED ORDER — PRAMIPEXOLE DIHYDROCHLORIDE 0.125 MG PO TABS: ORAL_TABLET | ORAL | 6 refills | Status: DC

## 2016-10-20 MED ORDER — GABAPENTIN 300 MG PO CAPS: ORAL_CAPSULE | ORAL | 3 refills | Status: DC
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
## 2016-10-20 NOTE — Patient Instructions (Addendum)
1. Increase Gabapentin 300mg : Take 1 capsule in AM, take 3 capsules in evening 2. Start Mirapex 0.125mg  tablet, take 2 hours before bedtime 3. Follow-up in 3 months, call for any changes

## 2016-10-20 NOTE — Procedures (Signed)
ELECTROENCEPHALOGRAM REPORT  Dates of Recording: 09/20/2016 to 09/21/2016  Patient's Name: Willie Kennedy MRN: [id number] Date of Birth: 1951-05-16  Referring Provider: Dr. Ellouise Newer  Procedure: 24-hour ambulatory EEG  History: This is a 64 year old man with recurrent stereotyped episodes of blurred vision and confusion/forgetfulness that occur each time he is looks at bright white lights or objects. EEG for classification.  Medications: Neurontin, aspirin, Lipitor, Nitrostat, Zanaflex, Ultram  Technical Summary: This is a 24-hour multichannel digital EEG recording measured by the international 10-20 system with electrodes applied with paste and impedances below 5000 ohms performed as portable with EKG monitoring.  The digital EEG was referentially recorded, reformatted, and digitally filtered in a variety of bipolar and referential montages for optimal display.    DESCRIPTION OF RECORDING: During maximal wakefulness, the background activity consisted of a symmetric 9 Hz posterior dominant rhythm which was reactive to eye opening.  There were no epileptiform discharges or focal slowing seen in wakefulness.  During the recording, the patient progresses through wakefulness, drowsiness, and Stage 2 sleep. During drowsiness, there is an increase in theta slowing of the background, at times with shifting asymmetry over the left and right hemispheres, left>right.  Again, there were no epileptiform discharges seen.  Events: There were no push button events.  There were no electrographic seizures seen.  EKG lead showed sinus bradycardia.  IMPRESSION: This 24-hour ambulatory EEG study is within normal limits.  CLINICAL CORRELATION: A normal EEG does not exclude a clinical diagnosis of epilepsy. Typical events were not captured. If further clinical questions remain, inpatient video EEG monitoring may be helpful.   Ellouise Newer, M.D.

## 2016-10-28 ENCOUNTER — Encounter: Payer: Self-pay | Admitting: *Deleted

## 2016-10-28 ENCOUNTER — Encounter: Payer: Self-pay | Admitting: Neurology

## 2017-01-07 ENCOUNTER — Ambulatory Visit: Payer: Medicare Other | Admitting: Neurology

## 2017-01-10 ENCOUNTER — Encounter: Payer: Self-pay | Admitting: Neurology

## 2017-02-08 DIAGNOSIS — R7303 Prediabetes: Secondary | ICD-10-CM | POA: Diagnosis not present

## 2017-02-08 DIAGNOSIS — E7800 Pure hypercholesterolemia, unspecified: Secondary | ICD-10-CM | POA: Diagnosis not present

## 2017-02-23 ENCOUNTER — Ambulatory Visit (INDEPENDENT_AMBULATORY_CARE_PROVIDER_SITE_OTHER): Payer: Medicare Other | Admitting: Neurology

## 2017-02-23 ENCOUNTER — Encounter: Payer: Self-pay | Admitting: Neurology

## 2017-02-23 VITALS — BP 100/64 | HR 70 | Temp 98.4°F | Ht 63.75 in | Wt 137.7 lb

## 2017-02-23 DIAGNOSIS — G43109 Migraine with aura, not intractable, without status migrainosus: Secondary | ICD-10-CM | POA: Diagnosis not present

## 2017-02-23 DIAGNOSIS — G2581 Restless legs syndrome: Secondary | ICD-10-CM | POA: Diagnosis not present

## 2017-02-23 DIAGNOSIS — I739 Peripheral vascular disease, unspecified: Secondary | ICD-10-CM

## 2017-02-23 MED ORDER — GABAPENTIN 300 MG PO CAPS: ORAL_CAPSULE | ORAL | 3 refills | Status: DC

## 2017-02-23 NOTE — Patient Instructions (Addendum)
1. Continue Gabapentin 300mg : Take 1 capsule in AM, 3 capsules in PM 2. Continue Mirapex/Pramipexole 0.125mg  2 hours before bedtime 3. Follow-up in 6 months, call for any changes
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
## 2017-02-24 DIAGNOSIS — E7800 Pure hypercholesterolemia, unspecified: Secondary | ICD-10-CM | POA: Diagnosis not present

## 2017-02-24 DIAGNOSIS — Z789 Other specified health status: Secondary | ICD-10-CM | POA: Diagnosis not present

## 2017-02-24 DIAGNOSIS — Z23 Encounter for immunization: Secondary | ICD-10-CM | POA: Diagnosis not present

## 2017-02-24 DIAGNOSIS — I739 Peripheral vascular disease, unspecified: Secondary | ICD-10-CM | POA: Diagnosis not present

## 2017-02-24 DIAGNOSIS — I2510 Atherosclerotic heart disease of native coronary artery without angina pectoris: Secondary | ICD-10-CM | POA: Diagnosis not present

## 2017-04-20 DIAGNOSIS — M62838 Other muscle spasm: Secondary | ICD-10-CM | POA: Diagnosis not present

## 2017-04-20 DIAGNOSIS — G629 Polyneuropathy, unspecified: Secondary | ICD-10-CM | POA: Diagnosis not present

## 2017-04-20 DIAGNOSIS — G2581 Restless legs syndrome: Secondary | ICD-10-CM | POA: Diagnosis not present

## 2017-05-06 ENCOUNTER — Ambulatory Visit (INDEPENDENT_AMBULATORY_CARE_PROVIDER_SITE_OTHER): Payer: Medicare Other | Admitting: Neurology

## 2017-05-06 ENCOUNTER — Encounter: Payer: Self-pay | Admitting: Neurology

## 2017-05-06 VITALS — BP 148/82 | HR 76 | Ht 64.5 in | Wt 141.0 lb

## 2017-05-06 DIAGNOSIS — M792 Neuralgia and neuritis, unspecified: Secondary | ICD-10-CM | POA: Diagnosis not present

## 2017-05-06 DIAGNOSIS — G43109 Migraine with aura, not intractable, without status migrainosus: Secondary | ICD-10-CM

## 2017-05-06 DIAGNOSIS — G2581 Restless legs syndrome: Secondary | ICD-10-CM

## 2017-05-06 DIAGNOSIS — I739 Peripheral vascular disease, unspecified: Secondary | ICD-10-CM

## 2017-05-06 MED ORDER — PRAMIPEXOLE DIHYDROCHLORIDE 0.125 MG PO TABS: ORAL_TABLET | ORAL | 6 refills | Status: DC

## 2017-05-06 MED ORDER — TRAMADOL HCL 50 MG PO TABS: ORAL_TABLET | ORAL | 5 refills | Status: DC

## 2017-05-06 MED ORDER — GABAPENTIN 300 MG PO CAPS: ORAL_CAPSULE | ORAL | 3 refills | Status: DC
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
## 2017-05-06 NOTE — Patient Instructions (Addendum)
1. Schedule EMG/NCV of both legs with Dr. Posey Pronto 2. Continue Tramadol 50mg  once a day 3. Increase Mirapex 0.125mg : Take 2 tablets 2 hours before night 4. Check your bottles, let us know if you are taking Duloxetine (Cymbalta). If you are taking it, we will increase dose. If not taking it, we will restart it. Call our office to let us know. 5. Continue gabapentin 300mg  in AM, 900mg  in PM 6. Call Dr. Nicole Cella office about the pain behind the right knee 7. Follow-up as scheduled in October

## 2017-05-09 ENCOUNTER — Encounter: Payer: Self-pay | Admitting: Neurology

## 2017-05-09 ENCOUNTER — Telehealth: Payer: Self-pay | Admitting: Neurology

## 2017-05-09 DIAGNOSIS — M792 Neuralgia and neuritis, unspecified: Secondary | ICD-10-CM | POA: Insufficient documentation

## 2017-05-09 NOTE — Telephone Encounter (Signed)
Caller: Jakori  Urgent? No  Reason for the call: He had left a message with the Answering Service after hours about not having one of his medications? He was here last week for a visit with Dr. Delice Lesch. Please Call. Thanks

## 2017-05-11 ENCOUNTER — Other Ambulatory Visit: Payer: Self-pay

## 2017-05-11 MED ORDER — DULOXETINE HCL 60 MG PO CPEP: 60.0000 mg | ORAL_CAPSULE | Freq: Every day | ORAL | 5 refills | Status: DC

## 2017-05-11 NOTE — Telephone Encounter (Signed)
Spoke with pt letting him know that I have sent an Rx for Cymbalta to his listed preferred pharmacy.  Pt appreciative

## 2017-05-24 ENCOUNTER — Ambulatory Visit (INDEPENDENT_AMBULATORY_CARE_PROVIDER_SITE_OTHER): Payer: Medicare Other | Admitting: Neurology

## 2017-05-24 DIAGNOSIS — M792 Neuralgia and neuritis, unspecified: Secondary | ICD-10-CM

## 2017-05-24 DIAGNOSIS — I739 Peripheral vascular disease, unspecified: Secondary | ICD-10-CM

## 2017-05-24 DIAGNOSIS — G2581 Restless legs syndrome: Secondary | ICD-10-CM

## 2017-05-24 DIAGNOSIS — G43109 Migraine with aura, not intractable, without status migrainosus: Secondary | ICD-10-CM

## 2017-05-24 NOTE — Procedures (Signed)
[facility]  [place]  [place], [place] [address] Tel: [phone] Fax:  [phone] Test Date:  05/24/2017  Patient: Willie Kennedy DOB: 03-May-1951 Physician: Narda Amber, DO  Sex: Male Height: 5\' 4"  Ref Phys: Ellouise Newer, M.D.  ID#: [id number]   Technician:    Patient Complaints: This is a 66 year-old gentleman referred for evaluation bilateral leg pain.  NCV & EMG Findings: Extensive electrodiagnostic testing of right lower extremity and additional studies of the left shows:  1. Bilateral sural and superficial peroneal sensory responses are within normal limits. 2. Bilateral peroneal and tibial motor responses are within normal limits. 3. Right tibial H reflex studies within normal limits. 4. There is no evidence of active or chronic motor axon loss changes affecting any of the tested muscles. Motor unit configuration and recruitment pattern is within normal limits.   Impression: This is a normal study of the lower extremities. In particular, there is no evidence of a generalized sensorimotor polyneuropathy or lumbosacral radiculopathy.    ___________________________ Narda Amber, DO    Nerve Conduction Studies Anti Sensory Summary Table   Site NR Peak (ms) Norm Peak (ms) P-T Amp (V) Norm P-T Amp  Left Sup Peroneal Anti Sensory (Ant Lat Mall)  12 cm    2.2 <4.6 10.5 >3  Right Sup Peroneal Anti Sensory (Ant Lat Mall)  12 cm    2.5 <4.6 9.1 >3  Left Sural Anti Sensory (Lat Mall)  Calf    3.4 <4.6 8.2 >3  Right Sural Anti Sensory (Lat Mall)  Calf    3.1 <4.6 8.1 >3   Motor Summary Table   Site NR Onset (ms) Norm Onset (ms) O-P Amp (mV) Norm O-P Amp Site1 Site2 Delta-0 (ms) Dist (cm) Vel (m/s) Norm Vel (m/s)  Left Peroneal Motor (Ext Dig Brev)  Ankle    3.9 <6.0 4.0 >2.5 B Fib Ankle 7.4 32.0 43 >40  B Fib    11.3  3.8  Poplt B Fib 1.5 8.0 53 >40  Poplt    12.8  3.8         Right Peroneal Motor (Ext Dig Brev)  Ankle    3.9 <6.0 3.5 >2.5  B Fib Ankle 7.6 34.0 45 >40  B Fib    11.5  3.4  Poplt B Fib 1.4 8.0 57 >40  Poplt    12.9  3.2         Left Tibial Motor (Abd Hall Brev)  Ankle    5.5 <6.0 7.1 >4 Knee Ankle 6.8 34.0 50 >40  Knee    12.3  4.8         Right Tibial Motor (Abd Hall Brev)  Ankle    5.6 <6.0 9.3 >4 Knee Ankle 6.8 35.0 51 >40  Knee    12.4  5.9          H Reflex Studies   NR H-Lat (ms) Lat Norm (ms) L-R H-Lat (ms)  Right Tibial (Gastroc)     34.15 <35    EMG   Side Muscle Ins Act Fibs Psw Fasc Number Recrt Dur Dur. Amp Amp. Poly Poly. Comment  Right AntTibialis Nml Nml Nml Nml Nml Nml Nml Nml Nml Nml Nml Nml N/A  Right BicepsFemS Nml Nml Nml Nml Nml Nml Nml Nml Nml Nml Nml Nml N/A  Right Gastroc Nml Nml Nml Nml Nml Nml Nml Nml Nml Nml Nml Nml N/A  Right Flex Dig Long Nml Nml Nml Nml Nml Nml  Nml Nml Nml Nml Nml Nml N/A  Right RectFemoris Nml Nml Nml Nml Nml Nml Nml Nml Nml Nml Nml Nml N/A  Right GluteusMed Nml Nml Nml Nml Nml Nml Nml Nml Nml Nml Nml Nml N/A  Left BicepsFemS Nml Nml Nml Nml Nml Nml Nml Nml Nml Nml Nml Nml N/A  Left AntTibialis Nml Nml Nml Nml Nml Nml Nml Nml Nml Nml Nml Nml N/A  Left Gastroc Nml Nml Nml Nml Nml Nml Nml Nml Nml Nml Nml Nml N/A  Left Flex Dig Long Nml Nml Nml Nml Nml Nml Nml Nml Nml Nml Nml Nml N/A  Left RectFemoris Nml Nml Nml Nml Nml Nml Nml Nml Nml Nml Nml Nml N/A  Left GluteusMed Nml Nml Nml Nml Nml Nml Nml Nml Nml Nml Nml Nml N/A      Waveforms:

## 2017-06-15 ENCOUNTER — Telehealth: Payer: Self-pay | Admitting: Neurology

## 2017-06-15 NOTE — Telephone Encounter (Signed)
I think this is about the EMG he had done 6/26

## 2017-06-15 NOTE — Telephone Encounter (Signed)
Pls let him know nerve test was normal, no evidence of permanent nerve injury or damage. Thanks

## 2017-06-15 NOTE — Telephone Encounter (Signed)
Caller: Joise (wife)  Urgent? No  Reason for the call: Regarding test results. Please call. Thanks

## 2017-06-16 NOTE — Telephone Encounter (Signed)
Spoke with pt's wife, [name], relaying message below

## 2017-06-25 ENCOUNTER — Encounter (HOSPITAL_COMMUNITY): Payer: Self-pay

## 2017-06-25 ENCOUNTER — Emergency Department (HOSPITAL_COMMUNITY)
Admission: EM | Admit: 2017-06-25 | Discharge: 2017-06-25 | Disposition: A | Discharge status: Home / Self Care | Payer: Medicare Other | Attending: Emergency Medicine | Admitting: Emergency Medicine

## 2017-06-25 DIAGNOSIS — Y929 Unspecified place or not applicable: Secondary | ICD-10-CM | POA: Insufficient documentation

## 2017-06-25 DIAGNOSIS — Y999 Unspecified external cause status: Secondary | ICD-10-CM | POA: Insufficient documentation

## 2017-06-25 DIAGNOSIS — X500XXA Overexertion from strenuous movement or load, initial encounter: Secondary | ICD-10-CM | POA: Insufficient documentation

## 2017-06-25 DIAGNOSIS — I2510 Atherosclerotic heart disease of native coronary artery without angina pectoris: Secondary | ICD-10-CM | POA: Diagnosis not present

## 2017-06-25 DIAGNOSIS — Z87891 Personal history of nicotine dependence: Secondary | ICD-10-CM | POA: Diagnosis not present

## 2017-06-25 DIAGNOSIS — S46212A Strain of muscle, fascia and tendon of other parts of biceps, left arm, initial encounter: Secondary | ICD-10-CM | POA: Diagnosis not present

## 2017-06-25 DIAGNOSIS — Z79899 Other long term (current) drug therapy: Secondary | ICD-10-CM | POA: Diagnosis not present

## 2017-06-25 DIAGNOSIS — Y9389 Activity, other specified: Secondary | ICD-10-CM | POA: Insufficient documentation

## 2017-06-25 DIAGNOSIS — S4992XA Unspecified injury of left shoulder and upper arm, initial encounter: Secondary | ICD-10-CM | POA: Diagnosis present

## 2017-06-25 MED ORDER — IBUPROFEN 800 MG PO TABS
800.0000 mg | ORAL_TABLET | Freq: Once | ORAL | Status: AC
  Administered 2017-06-25: 800 mg via ORAL
  Filled 2017-06-25: qty 1

## 2017-06-25 NOTE — ED Provider Notes (Signed)
[facility] DEPT Provider Note   CSN: [id number] Arrival date & time: 06/25/17  7408     History   Chief Complaint Chief Complaint  Patient presents with  . Arm Injury    HPI Willie Kennedy is a 66 y.o. male.  HPI 66 year old man history of coronary artery disease, peripheral vascular disease, presents today with injury to left upper arm after lifting a heavy door. He was in the middle of lifting it when he had sudden pain in his left upper arm and deformity to the left biceps. He is holding the arm partially flexed stating that it is painful to movement. A history of a proximal biceps tendon rupture on the right arm that was repaired in 2003 by Dr. Burney Gauze. Patient denies any other injury at this time. He reports no numbness or tingling distal to the injury. He reports deformity of the biceps with the biceps head appearing proximally in the arm. Past Medical History:  Diagnosis Date  . Biceps tendon rupture    h/o Right biceps tendon rupture  . CAD (coronary artery disease)    a. ETT myoview (10/11) with EF 52%, reversible inferior perfusion defect, 1 mm inferolateral ST depression with exercise, 7"16" and stopped due to dyspnea and leg weakness -> LHC (10/11), EF 55%, 40% pRCA, 40% mRCA, 40% prox to mid CFX. medical treatment.  . Cancer ([place])    skin cancer behind left ear   . Dizziness    with bending over   . Hard of hearing    more per right ear  . Headache    with bright light   . History of bronchitis   . Hyperlipidemia   . Impaired glucose tolerance   . Numbness and tingling in left arm    if holds arm upward too long pt states his primary MD is aware  . Osteoarthritis   . Peripheral vascular disease ([place]) 2005   a. s/p left fem-pop Dr.Lawson in 2005.  Marland Kitchen Shortness of breath dyspnea    walking distances or climbing stairs  . Tinnitus     Patient Active Problem List   Diagnosis Date Noted  . Neuropathic pain 05/09/2017  . Complicated migraine 14/48/1856  .  Restless leg syndrome 02/23/2017  . Transient alteration of awareness 08/06/2016  . Hyperreflexia 08/06/2016  . Muscle cramps 08/06/2016  . Left leg pain 02/06/2016  . Impacted ear wax 07/04/2015  . Hearing loss of both ears 07/04/2015  . Ventral hernia 05/16/2015  . Nocturnal leg cramps 05/16/2015  . Bruit 09/20/2014  . Chest pain 09/20/2014  . Aftercare following surgery of the circulatory system, Echo 08/07/2014  . Right elbow pain 01/10/2014  . Right leg pain 06/18/2013  . Preventative health care 05/04/2013  . Impaired glucose tolerance 05/04/2013  . CAD, NATIVE VESSEL 10/01/2010  . Nonspecific abnormal electrocardiogram (ECG) (EKG) 09/04/2010  . Hyperlipidemia 08/27/2010  . OSTEOARTHRITIS, HANDS, BILATERAL 08/27/2010  . Peripheral vascular disease ([place]) 09/06/2008    Past Surgical History:  Procedure Laterality Date  . ds/p left femoral artery occlusion  2005   s/p left fem-pop bypass- Dr. Kellie Simmering  . PR VEIN BYPASS GRAFT,AORTO-FEM-POP    . s/p right bicep tendon rupture repair    . VENTRAL HERNIA REPAIR N/A 08/22/2015   Procedure: OPEN VENTRAL HERNIA REPAIR WITH MESH;  Surgeon: Jackolyn Confer, MD;  Location: [facility] ORS;  Service: General;  Laterality: N/A;       Home Medications    Prior to Admission medications  Medication Sig Start Date End Date Taking? Authorizing Provider  aspirin 81 MG tablet Take 81 mg by mouth daily.     [provider]  DULoxetine (CYMBALTA) 60 MG capsule Take 1 capsule (60 mg total) by mouth daily. 05/11/17   Cameron Sprang, MD  ezetimibe (ZETIA) 10 MG tablet Take 1 tablet (10 mg total) by mouth daily. 09/01/16 11/30/16  Lelon Perla, MD  gabapentin (NEURONTIN) 300 MG capsule Take 1 capsule in AM, 3 capsules in evening 05/06/17   Cameron Sprang, MD  nitroGLYCERIN (NITROSTAT) 0.4 MG SL tablet Place 1 tablet (0.4 mg total) under the tongue every 5 (five) minutes as needed. Place 1 tablet under tongue for chest pain -- not to exceed 3 in  15 minutes 05/04/13   Biagio Borg, MD  pramipexole (MIRAPEX) 0.125 MG tablet Take 2 tablet 2 hours before bedtime 05/06/17   Cameron Sprang, MD  tiZANidine (ZANAFLEX) 4 MG tablet TAKE ONE TABLET BY MOUTH EVERY 6 HOURS AS NEEDED FOR MUSCLE SPASM 02/06/16   Biagio Borg, MD  traMADol Veatrice Bourbon) 50 MG tablet Take 1 tablet every night 05/06/17   Cameron Sprang, MD    Family History Family History  Problem Relation Age of Onset  . Cervical cancer Mother   . Cancer Mother        cervical  . Lung cancer Father   . Cancer Father        lung    Social History Social History  Substance Use Topics  . Smoking status: Former Smoker    Packs/day: 7.00    Years: 40.00    Types: Cigarettes    Quit date: 11/29/1994  . Smokeless tobacco: Never Used  . Alcohol use No     Allergies   Patient has no known allergies.   Review of Systems Review of Systems  All other systems reviewed and are negative.    Physical Exam Updated Vital Signs BP (!) 139/91 (BP Location: Right Arm)   Pulse 75   Temp 98.2 F (36.8 C) (Oral)   Resp 16   Ht 1.638 m (5' 4.5")   Wt 62.6 kg (138 lb)   SpO2 95%   BMI 23.32 kg/m   Physical Exam  Constitutional: He appears well-developed and well-nourished.  HENT:  Head: Normocephalic and atraumatic.  Eyes: EOM are normal.  Neck: Normal range of motion.  Cardiovascular: Normal rate.   Abdominal: Soft.  Musculoskeletal:  Left upper arm held in partial flexion. Biceps appears deformed with biceps proximally in arm. This is compared to the opposite side. Biceps tendon is not palpated on the left side. Patient is unable to flex further. Radial pulse is intact. Sensation is intact. No forearm deformity or shortening deformity noted.  Neurological: He is alert.  Skin: Skin is warm. Capillary refill takes less than 2 seconds. He is not diaphoretic.  Nursing note and vitals reviewed.    ED Treatments / Results  Labs (all labs ordered are listed, but only abnormal  results are displayed) Labs Reviewed - No data to display  EKG  EKG Interpretation None       Radiology No results found.  Procedures Procedures (including critical care time)  Medications Ordered in ED Medications - No data to display   Initial Impression / Assessment and Plan / ED Course  I have reviewed the triage vital signs and the nursing notes.  Pertinent labs & imaging results that were available during my care of the  patient were reviewed by me and considered in my medical decision making (see chart for details).   discussed with Dr. Burney Gauze. Plan sling, therapy, nonsteroidal anti-inflammatory drugs, and patient follow-up with Dr. Burney Gauze on Monday or Tuesday. He may call the office Monday morning.  Final Clinical Impressions(s) / ED Diagnoses   Final diagnoses:  Rupture of left distal biceps tendon, initial encounter   Use sling, cold therapy, and ibuprofen for pain and symptom control. Call Dr. Bertis Ruddy office Monday morning to be seen Monday afternoon or Tuesday New Prescriptions New Prescriptions   No medications on file     Pattricia Boss, MD 06/25/17 [phone]

## 2017-06-25 NOTE — ED Triage Notes (Signed)
Per Pt, Pt was moving a heavy door when he heard a pop in his left arm and had pain rush into it. Pt believe he tore his bicep with hx of the same.

## 2017-06-25 NOTE — Discharge Instructions (Signed)
Use sling, cold therapy, and ibuprofen for pain and symptom control. Call Dr. Bertis Ruddy office Monday morning to be seen Monday afternoon or Tuesday.

## 2017-06-27 DIAGNOSIS — S46212A Strain of muscle, fascia and tendon of other parts of biceps, left arm, initial encounter: Secondary | ICD-10-CM | POA: Insufficient documentation

## 2017-06-29 DIAGNOSIS — G8918 Other acute postprocedural pain: Secondary | ICD-10-CM | POA: Diagnosis not present

## 2017-06-29 DIAGNOSIS — M66322 Spontaneous rupture of flexor tendons, left upper arm: Secondary | ICD-10-CM | POA: Diagnosis not present

## 2017-07-05 DIAGNOSIS — M25522 Pain in left elbow: Secondary | ICD-10-CM | POA: Diagnosis not present

## 2017-07-05 DIAGNOSIS — S46212D Strain of muscle, fascia and tendon of other parts of biceps, left arm, subsequent encounter: Secondary | ICD-10-CM | POA: Diagnosis not present

## 2017-07-05 DIAGNOSIS — S46212A Strain of muscle, fascia and tendon of other parts of biceps, left arm, initial encounter: Secondary | ICD-10-CM | POA: Diagnosis not present

## 2017-08-16 DIAGNOSIS — S46212A Strain of muscle, fascia and tendon of other parts of biceps, left arm, initial encounter: Secondary | ICD-10-CM | POA: Diagnosis not present

## 2017-08-17 DIAGNOSIS — E7800 Pure hypercholesterolemia, unspecified: Secondary | ICD-10-CM | POA: Diagnosis not present

## 2017-08-17 DIAGNOSIS — Z125 Encounter for screening for malignant neoplasm of prostate: Secondary | ICD-10-CM | POA: Diagnosis not present

## 2017-08-17 DIAGNOSIS — Z0000 Encounter for general adult medical examination without abnormal findings: Secondary | ICD-10-CM | POA: Diagnosis not present

## 2017-08-19 DIAGNOSIS — G8918 Other acute postprocedural pain: Secondary | ICD-10-CM | POA: Diagnosis not present

## 2017-08-19 DIAGNOSIS — M79A12 Nontraumatic compartment syndrome of left upper extremity: Secondary | ICD-10-CM | POA: Diagnosis not present

## 2017-08-19 DIAGNOSIS — M62132 Other rupture of muscle (nontraumatic), left forearm: Secondary | ICD-10-CM | POA: Diagnosis not present

## 2017-08-19 DIAGNOSIS — S46212A Strain of muscle, fascia and tendon of other parts of biceps, left arm, initial encounter: Secondary | ICD-10-CM | POA: Diagnosis not present

## 2017-08-19 DIAGNOSIS — W182XXA Fall in (into) shower or empty bathtub, initial encounter: Secondary | ICD-10-CM | POA: Diagnosis not present

## 2017-08-23 NOTE — Progress Notes (Signed)
HPI: FU nonobstructive CAD, PVD s/p previous left fem-pop in 2005 followed by vascular, h/o longstanding brief atypical chest pains. H/o nuc in 2011 showing EF 52% and reversible inferior defect. Subsequent cath 10/11 showed 40% prox RCA, 40% mid RCA, 50% prox-to-mid Cx, EF 55% with some basal inferior HK. Screening AAA duplex negative for aneurysm 09/2014. Nuclear study repeated August 2016 and showed ejection fraction 43%. There was normal perfusion. Echocardiogram August 2016 showed normal LV function with ejection fraction 60-65% and grade 1 diastolic dysfunction. LDL July 2017 132. Since last seen, patient has some dyspnea on exertion but no orthopnea or PND. Minimal pedal edema. He has an occasional cramping sensation under his left breast but no exertional chest pain.  Current Outpatient Prescriptions  Medication Sig Dispense Refill  . aspirin 81 MG tablet Take 81 mg by mouth daily.     . DULoxetine (CYMBALTA) 60 MG capsule Take 1 capsule (60 mg total) by mouth daily. 30 capsule 5  . gabapentin (NEURONTIN) 300 MG capsule Take 2 capsules in AM, 3 capsules in evening 450 capsule 3  . nitroGLYCERIN (NITROSTAT) 0.4 MG SL tablet Place 1 tablet (0.4 mg total) under the tongue every 5 (five) minutes as needed. Place 1 tablet under tongue for chest pain -- not to exceed 3 in 15 minutes 100 tablet 11  . oxyCODONE-acetaminophen (PERCOCET/ROXICET) 5-325 MG tablet TAKE 1 OR 2 TABLETS BY MOUTH EVERY 4 TO 6 HOURS AS NEEDED FOR PAIN  0  . pramipexole (MIRAPEX) 0.125 MG tablet Take 1 tablet 2 hours before bedtime 30 tablet 6  . tiZANidine (ZANAFLEX) 4 MG tablet TAKE ONE TABLET BY MOUTH EVERY 6 HOURS AS NEEDED FOR MUSCLE SPASM 60 tablet 1  . traMADol (ULTRAM) 50 MG tablet Take 1 tablet every night 30 tablet 5  . ezetimibe (ZETIA) 10 MG tablet Take 1 tablet (10 mg total) by mouth daily. 90 tablet 3   No current facility-administered medications for this visit.      Past Medical History:  Diagnosis  Date  . Biceps tendon rupture    h/o Right biceps tendon rupture  . CAD (coronary artery disease)    a. ETT myoview (10/11) with EF 52%, reversible inferior perfusion defect, 1 mm inferolateral ST depression with exercise, 7"16" and stopped due to dyspnea and leg weakness -> LHC (10/11), EF 55%, 40% pRCA, 40% mRCA, 40% prox to mid CFX. medical treatment.  . Cancer ([place])    skin cancer behind left ear   . Dizziness    with bending over   . Hard of hearing    more per right ear  . Headache    with bright light   . History of bronchitis   . Hyperlipidemia   . Impaired glucose tolerance   . Numbness and tingling in left arm    if holds arm upward too long pt states his primary MD is aware  . Osteoarthritis   . Peripheral vascular disease ([place]) 2005   a. s/p left fem-pop Dr.Lawson in 2005.  Marland Kitchen Shortness of breath dyspnea    walking distances or climbing stairs  . Tinnitus     Past Surgical History:  Procedure Laterality Date  . ds/p left femoral artery occlusion  2005   s/p left fem-pop bypass- Dr. Kellie Simmering  . PR VEIN BYPASS GRAFT,AORTO-FEM-POP    . s/p right bicep tendon rupture repair    . VENTRAL HERNIA REPAIR N/A 08/22/2015   Procedure: OPEN VENTRAL HERNIA REPAIR WITH  MESH;  Surgeon: Jackolyn Confer, MD;  Location: [facility];  Service: General;  Laterality: N/A;    Social History   Social History  . Marital status: Married    Spouse name: N/A  . Number of children: 1  . Years of education: N/A   Occupational History  . Master woood craftsman    Social History Main Topics  . Smoking status: Former Smoker    Packs/day: 7.00    Years: 40.00    Types: Cigarettes    Quit date: 11/29/1994  . Smokeless tobacco: Never Used  . Alcohol use No  . Drug use: No  . Sexual activity: Not on file   Other Topics Concern  . Not on file   Social History Narrative  . No narrative on file    Family History  Problem Relation Age of Onset  . Cervical cancer Mother   . Cancer Mother         cervical  . Lung cancer Father   . Cancer Father        lung    ROS: Some residual pain in left upper extremity from recent surgical procedure but no fevers or chills, productive cough, hemoptysis, dysphasia, odynophagia, melena, hematochezia, dysuria, hematuria, rash, seizure activity, orthopnea, PND, pedal claudication. Remaining systems are negative.  Physical Exam: Well-developed well-nourished in no acute distress.  Skin is warm and dry.  HEENT is normal.  Neck is supple.  Chest is clear to auscultation with normal expansion.  Cardiovascular exam is regular rate and rhythm.  Abdominal exam nontender or distended. No masses palpated. Extremities show trace edema and varicosities; ecchymoses over LUE. neuro grossly intact  ECG- sinus bradycardia at a rate of 53. No ST changes. personally reviewed  A/P  1 coronary artery disease-continue aspirin. Patient is intolerant to statins.  2 peripheral vascular disease-continue aspirin. Patient is followed by vascular surgery.  3 hyperlipidemia-patient is intolerant to statins. Continue Zetia.   4 chest pain-patient has occasional cramping sensation under her left breast but no exertional chest pain. Electrocardiogram shows no ST changes. Previous functional study negative. Will not pursue further ischemia evaluation.  Kirk Ruths, MD

## 2017-08-24 DIAGNOSIS — I739 Peripheral vascular disease, unspecified: Secondary | ICD-10-CM | POA: Diagnosis not present

## 2017-08-24 DIAGNOSIS — Z9889 Other specified postprocedural states: Secondary | ICD-10-CM | POA: Diagnosis not present

## 2017-08-24 DIAGNOSIS — G629 Polyneuropathy, unspecified: Secondary | ICD-10-CM | POA: Diagnosis not present

## 2017-08-24 DIAGNOSIS — Z789 Other specified health status: Secondary | ICD-10-CM | POA: Diagnosis not present

## 2017-08-24 DIAGNOSIS — G2581 Restless legs syndrome: Secondary | ICD-10-CM | POA: Diagnosis not present

## 2017-08-24 DIAGNOSIS — I2510 Atherosclerotic heart disease of native coronary artery without angina pectoris: Secondary | ICD-10-CM | POA: Diagnosis not present

## 2017-08-24 DIAGNOSIS — F329 Major depressive disorder, single episode, unspecified: Secondary | ICD-10-CM | POA: Diagnosis not present

## 2017-08-24 DIAGNOSIS — E7800 Pure hypercholesterolemia, unspecified: Secondary | ICD-10-CM | POA: Diagnosis not present

## 2017-08-24 DIAGNOSIS — E039 Hypothyroidism, unspecified: Secondary | ICD-10-CM | POA: Diagnosis not present

## 2017-08-29 ENCOUNTER — Encounter: Payer: Self-pay | Admitting: Neurology

## 2017-08-29 ENCOUNTER — Ambulatory Visit (INDEPENDENT_AMBULATORY_CARE_PROVIDER_SITE_OTHER): Payer: Medicare Other | Admitting: Neurology

## 2017-08-29 VITALS — BP 118/64 | HR 71 | Ht 65.5 in | Wt 138.0 lb

## 2017-08-29 DIAGNOSIS — G2581 Restless legs syndrome: Secondary | ICD-10-CM

## 2017-08-29 DIAGNOSIS — M792 Neuralgia and neuritis, unspecified: Secondary | ICD-10-CM

## 2017-08-29 DIAGNOSIS — R404 Transient alteration of awareness: Secondary | ICD-10-CM | POA: Diagnosis not present

## 2017-08-29 DIAGNOSIS — G43109 Migraine with aura, not intractable, without status migrainosus: Secondary | ICD-10-CM | POA: Diagnosis not present

## 2017-08-29 DIAGNOSIS — I739 Peripheral vascular disease, unspecified: Secondary | ICD-10-CM | POA: Diagnosis not present

## 2017-08-29 MED ORDER — PRAMIPEXOLE DIHYDROCHLORIDE 0.125 MG PO TABS: ORAL_TABLET | ORAL | 6 refills | Status: DC

## 2017-08-29 MED ORDER — GABAPENTIN 300 MG PO CAPS: ORAL_CAPSULE | ORAL | 3 refills | Status: DC

## 2017-08-29 MED ORDER — TRAMADOL HCL 50 MG PO TABS: ORAL_TABLET | ORAL | 5 refills | Status: DC

## 2017-08-29 NOTE — Patient Instructions (Addendum)
1. Increase Gabapentin 300mg : take 2 caps in AM, 3 caps in PM 2. Continue Mirapex 0.125mg : Take 1 tablet 2 hours before bedtime 3. Continue Tramadol 50mg  at bedtime 4. If leg pain continues on current medications, please call our office in 3 months and we will send the referral to Pain Management 5. Follow-up in 6 months, call for any changes
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
## 2017-08-31 ENCOUNTER — Encounter (HOSPITAL_COMMUNITY): Payer: Medicare Other

## 2017-08-31 ENCOUNTER — Ambulatory Visit: Payer: Medicare Other | Admitting: Vascular Surgery

## 2017-09-02 ENCOUNTER — Other Ambulatory Visit: Payer: Self-pay

## 2017-09-02 ENCOUNTER — Encounter: Payer: Self-pay | Admitting: Cardiology

## 2017-09-02 ENCOUNTER — Ambulatory Visit (INDEPENDENT_AMBULATORY_CARE_PROVIDER_SITE_OTHER): Payer: Medicare Other | Admitting: Cardiology

## 2017-09-02 VITALS — BP 140/76 | HR 53 | Ht 65.0 in | Wt 143.0 lb

## 2017-09-02 DIAGNOSIS — G43109 Migraine with aura, not intractable, without status migrainosus: Secondary | ICD-10-CM

## 2017-09-02 DIAGNOSIS — I2510 Atherosclerotic heart disease of native coronary artery without angina pectoris: Secondary | ICD-10-CM

## 2017-09-02 DIAGNOSIS — E7800 Pure hypercholesterolemia, unspecified: Secondary | ICD-10-CM

## 2017-09-02 DIAGNOSIS — R072 Precordial pain: Secondary | ICD-10-CM | POA: Diagnosis not present

## 2017-09-02 DIAGNOSIS — I739 Peripheral vascular disease, unspecified: Secondary | ICD-10-CM

## 2017-09-02 MED ORDER — GABAPENTIN 300 MG PO CAPS: ORAL_CAPSULE | ORAL | 3 refills | Status: DC

## 2017-09-02 NOTE — Patient Instructions (Signed)
Your physician wants you to follow-up in: ONE YEAR WITH DR CRENSHAW You will receive a reminder letter in the mail two months in advance. If you don't receive a letter, please call our office to schedule the follow-up appointment.   If you need a refill on your cardiac medications before your next appointment, please call your pharmacy.  

## 2017-09-26 DIAGNOSIS — M25532 Pain in left wrist: Secondary | ICD-10-CM | POA: Insufficient documentation

## 2017-09-26 DIAGNOSIS — S46212A Strain of muscle, fascia and tendon of other parts of biceps, left arm, initial encounter: Secondary | ICD-10-CM | POA: Diagnosis not present

## 2017-09-26 DIAGNOSIS — M25522 Pain in left elbow: Secondary | ICD-10-CM | POA: Diagnosis not present

## 2017-09-26 DIAGNOSIS — S46212D Strain of muscle, fascia and tendon of other parts of biceps, left arm, subsequent encounter: Secondary | ICD-10-CM | POA: Diagnosis not present

## 2017-10-03 DIAGNOSIS — S46212D Strain of muscle, fascia and tendon of other parts of biceps, left arm, subsequent encounter: Secondary | ICD-10-CM | POA: Diagnosis not present

## 2017-10-03 DIAGNOSIS — M25622 Stiffness of left elbow, not elsewhere classified: Secondary | ICD-10-CM | POA: Diagnosis not present

## 2017-10-03 DIAGNOSIS — S46212A Strain of muscle, fascia and tendon of other parts of biceps, left arm, initial encounter: Secondary | ICD-10-CM | POA: Diagnosis not present

## 2017-10-03 DIAGNOSIS — M25522 Pain in left elbow: Secondary | ICD-10-CM | POA: Diagnosis not present

## 2017-10-03 DIAGNOSIS — M25532 Pain in left wrist: Secondary | ICD-10-CM | POA: Diagnosis not present

## 2017-10-11 DIAGNOSIS — M25622 Stiffness of left elbow, not elsewhere classified: Secondary | ICD-10-CM | POA: Diagnosis not present

## 2017-10-11 DIAGNOSIS — S46212D Strain of muscle, fascia and tendon of other parts of biceps, left arm, subsequent encounter: Secondary | ICD-10-CM | POA: Diagnosis not present

## 2017-10-11 DIAGNOSIS — R531 Weakness: Secondary | ICD-10-CM | POA: Diagnosis not present

## 2017-10-18 DIAGNOSIS — M25632 Stiffness of left wrist, not elsewhere classified: Secondary | ICD-10-CM | POA: Diagnosis not present

## 2017-10-18 DIAGNOSIS — M25622 Stiffness of left elbow, not elsewhere classified: Secondary | ICD-10-CM | POA: Diagnosis not present

## 2017-10-18 DIAGNOSIS — R531 Weakness: Secondary | ICD-10-CM | POA: Diagnosis not present

## 2017-10-24 DIAGNOSIS — E039 Hypothyroidism, unspecified: Secondary | ICD-10-CM | POA: Diagnosis not present

## 2017-10-25 DIAGNOSIS — R531 Weakness: Secondary | ICD-10-CM | POA: Diagnosis not present

## 2017-11-02 ENCOUNTER — Ambulatory Visit (INDEPENDENT_AMBULATORY_CARE_PROVIDER_SITE_OTHER)
Admission: RE | Admit: 2017-11-02 | Discharge: 2017-11-02 | Disposition: A | Discharge status: Home / Self Care | Payer: Medicare Other | Source: Ambulatory Visit | Attending: Vascular Surgery | Admitting: Vascular Surgery

## 2017-11-02 ENCOUNTER — Ambulatory Visit (INDEPENDENT_AMBULATORY_CARE_PROVIDER_SITE_OTHER): Payer: Medicare Other | Admitting: Vascular Surgery

## 2017-11-02 ENCOUNTER — Encounter: Payer: Self-pay | Admitting: Vascular Surgery

## 2017-11-02 ENCOUNTER — Ambulatory Visit (HOSPITAL_COMMUNITY)
Admission: RE | Admit: 2017-11-02 | Discharge: 2017-11-02 | Disposition: A | Discharge status: Home / Self Care | Payer: Medicare Other | Source: Ambulatory Visit | Attending: Vascular Surgery | Admitting: Vascular Surgery

## 2017-11-02 VITALS — BP 153/87 | HR 59 | Resp 20 | Ht 65.0 in | Wt 144.0 lb

## 2017-11-02 DIAGNOSIS — Z87891 Personal history of nicotine dependence: Secondary | ICD-10-CM | POA: Diagnosis not present

## 2017-11-02 DIAGNOSIS — I739 Peripheral vascular disease, unspecified: Secondary | ICD-10-CM | POA: Insufficient documentation

## 2017-11-02 DIAGNOSIS — R9389 Abnormal findings on diagnostic imaging of other specified body structures: Secondary | ICD-10-CM | POA: Diagnosis not present

## 2017-11-02 DIAGNOSIS — R0989 Other specified symptoms and signs involving the circulatory and respiratory systems: Secondary | ICD-10-CM | POA: Diagnosis present

## 2017-11-02 DIAGNOSIS — I2510 Atherosclerotic heart disease of native coronary artery without angina pectoris: Secondary | ICD-10-CM | POA: Diagnosis not present

## 2017-11-02 NOTE — Progress Notes (Signed)
[facility] HISTORY AND PHYSICAL   History of Present Illness:  Patient is a 66 y.o. year old male who presents for follow and repeat ABI's.  He under went  left femoral to popliteal bypass with greater saphenous vein by Dr. Kellie Simmering in 2005.  He reports no calf pain, no non healing wounds and he is able to walk as far as he likes.    He recently had a right biceps tendon rupture and repair, other wise no medical history changes.  He continues to take a daily aspirin and Zetia for hypercholesterolemia.  Past Medical History:  Diagnosis Date  . Biceps tendon rupture    h/o Right biceps tendon rupture  . CAD (coronary artery disease)    a. ETT myoview (10/11) with EF 52%, reversible inferior perfusion defect, 1 mm inferolateral ST depression with exercise, 7"16" and stopped due to dyspnea and leg weakness -> LHC (10/11), EF 55%, 40% pRCA, 40% mRCA, 40% prox to mid CFX. medical treatment.  . Cancer ([place])    skin cancer behind left ear   . Dizziness    with bending over   . Hard of hearing    more per right ear  . Headache    with bright light   . History of bronchitis   . Hyperlipidemia   . Impaired glucose tolerance   . Numbness and tingling in left arm    if holds arm upward too long pt states his primary MD is aware  . Osteoarthritis   . Peripheral vascular disease ([place]) 2005   a. s/p left fem-pop Dr.Lawson in 2005.  Marland Kitchen Shortness of breath dyspnea    walking distances or climbing stairs  . Tinnitus     Past Surgical History:  Procedure Laterality Date  . ds/p left femoral artery occlusion  2005   s/p left fem-pop bypass- Dr. Kellie Simmering  . PR VEIN BYPASS GRAFT,AORTO-FEM-POP    . s/p right bicep tendon rupture repair    . VENTRAL HERNIA REPAIR N/A 08/22/2015   Procedure: OPEN VENTRAL HERNIA REPAIR WITH MESH;  Surgeon: Jackolyn Confer, MD;  Location: [facility];  Service: General;  Laterality: N/A;    ROS:   General:  No weight loss, Fever, chills  HEENT:  No recent headaches, no nasal bleeding, no visual changes, no sore throat  Neurologic: No dizziness, blackouts, seizures. No recent symptoms of stroke or mini- stroke. No recent episodes of slurred speech, or temporary blindness.  Cardiac: No recent episodes of chest pain/pressure, no shortness of breath at rest.  No shortness of breath with exertion.  Denies history of atrial fibrillation or irregular heartbeat  Vascular: No history of rest pain in feet.  No history of claudication.  No history of non-healing ulcer, No history of DVT   Pulmonary: No home oxygen, no productive cough, no hemoptysis,  No asthma or wheezing  Musculoskeletal:  [x ] Arthritis, [ ]  Low back pain,  [ ]  Joint pain  Hematologic:No history of hypercoagulable state.  No history of easy bleeding.  No history of anemia  Gastrointestinal: No hematochezia or melena,  No gastroesophageal reflux, no trouble swallowing  Urinary: [ ]  chronic Kidney disease, [ ]  on HD - [ ]  MWF or [ ]  TTHS, [ ]  Burning with urination, [ ]  Frequent urination, [ ]  Difficulty urinating;   Skin: No rashes, dry skin  Psychological: No history of anxiety,  No history of depression  Social History Social History   Tobacco Use  .  Smoking status: Former Smoker    Packs/day: 7.00    Years: 40.00    Pack years: 280.00    Types: Cigarettes    Last attempt to quit: 11/29/1994    Years since quitting: 22.9  . Smokeless tobacco: Never Used  Substance Use Topics  . Alcohol use: No  . Drug use: No    Family History Family History  Problem Relation Age of Onset  . Cervical cancer Mother   . Cancer Mother        cervical  . Lung cancer Father   . Cancer Father        lung    Allergies  No Known Allergies   Current Outpatient Medications  Medication Sig Dispense Refill  . aspirin 81 MG tablet Take 81 mg by mouth daily.     . DULoxetine (CYMBALTA) 60 MG capsule Take 1 capsule (60 mg total) by mouth daily. 30 capsule 5  . ezetimibe  (ZETIA) 10 MG tablet Take 1 tablet (10 mg total) by mouth daily. 90 tablet 3  . gabapentin (NEURONTIN) 300 MG capsule Take 2 capsules in AM, 3 capsules in evening 450 capsule 3  . nitroGLYCERIN (NITROSTAT) 0.4 MG SL tablet Place 1 tablet (0.4 mg total) under the tongue every 5 (five) minutes as needed. Place 1 tablet under tongue for chest pain -- not to exceed 3 in 15 minutes 100 tablet 11  . oxyCODONE-acetaminophen (PERCOCET/ROXICET) 5-325 MG tablet TAKE 1 OR 2 TABLETS BY MOUTH EVERY 4 TO 6 HOURS AS NEEDED FOR PAIN  0  . pramipexole (MIRAPEX) 0.125 MG tablet Take 1 tablet 2 hours before bedtime 30 tablet 6  . tiZANidine (ZANAFLEX) 4 MG tablet TAKE ONE TABLET BY MOUTH EVERY 6 HOURS AS NEEDED FOR MUSCLE SPASM 60 tablet 1  . traMADol (ULTRAM) 50 MG tablet Take 1 tablet every night 30 tablet 5   No current facility-administered medications for this visit.     Physical Examination  There were no vitals filed for this visit.  There is no height or weight on file to calculate BMI.  General:  Alert and oriented, no acute distress HEENT: Normal Neck: No bruit or JVD Pulmonary: Clear to auscultation bilaterally Cardiac: Regular Rate and Rhythm without murmur Abdomen: Soft, non-tender, non-distended, no mass, no scars Skin: No rash Extremity Pulses:  2+ radial, brachial, femoral, dorsalis pedis, posterior tibial pulses bilaterally Musculoskeletal: No deformity or edema  Neurologic: Upper and lower extremity motor 5/5 and symmetric  DATA:  ABI's Right 0.97 and TBI 0.48 Left >1.0 and TBI 0.48   ASSESSMENT:  PAD   PLAN:  66 y.o. male who is s/p left femoral to popliteal bypass with greater saphenous vein by Dr. Kellie Simmering in 2005 who presents for yearly surveillance. He has no new complaints and is able to participate with ADL's.  He does continue to have chronic left LE edema that is better with elevation and the first thing in the am.  f/u with Dr. Scot Dock in one year with duplex.  He will  call sooner if needed.    Roxy Horseman PA-C [facility]  The patient was seen in conjunction with Dr. Scot Dock today

## 2017-11-21 NOTE — Addendum Note (Signed)
Addended by: Lianne Cure A on: 11/21/2017 11:23 AM   Modules accepted: Orders

## 2018-02-27 ENCOUNTER — Encounter: Payer: Self-pay | Admitting: Neurology

## 2018-02-27 ENCOUNTER — Other Ambulatory Visit: Payer: Self-pay

## 2018-02-27 ENCOUNTER — Ambulatory Visit (INDEPENDENT_AMBULATORY_CARE_PROVIDER_SITE_OTHER): Payer: Medicare Other | Admitting: Neurology

## 2018-02-27 VITALS — BP 116/70 | HR 61 | Ht 65.0 in | Wt 141.0 lb

## 2018-02-27 DIAGNOSIS — G43109 Migraine with aura, not intractable, without status migrainosus: Secondary | ICD-10-CM | POA: Diagnosis not present

## 2018-02-27 DIAGNOSIS — I739 Peripheral vascular disease, unspecified: Secondary | ICD-10-CM | POA: Diagnosis not present

## 2018-02-27 DIAGNOSIS — M79602 Pain in left arm: Secondary | ICD-10-CM | POA: Diagnosis not present

## 2018-02-27 MED ORDER — GABAPENTIN 300 MG PO CAPS: ORAL_CAPSULE | ORAL | 3 refills | Status: DC

## 2018-02-27 NOTE — Patient Instructions (Addendum)
1. Continue Gabapentin 300mg : take 2 caps in AM, 3 caps in PM 2. Refer to PT for left arm pain and limited range of motion (we will try to get into the [facility] on [facility] [phone] 3. Follow-up with Dr. Burney Gauze for left arm pain 4. Follow-up in 6 months, call for any changes
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
## 2018-03-02 DIAGNOSIS — M25612 Stiffness of left shoulder, not elsewhere classified: Secondary | ICD-10-CM | POA: Diagnosis not present

## 2018-03-02 DIAGNOSIS — M25512 Pain in left shoulder: Secondary | ICD-10-CM | POA: Diagnosis not present

## 2018-03-02 DIAGNOSIS — M7542 Impingement syndrome of left shoulder: Secondary | ICD-10-CM | POA: Diagnosis not present

## 2018-03-02 DIAGNOSIS — M79622 Pain in left upper arm: Secondary | ICD-10-CM | POA: Diagnosis not present

## 2018-03-20 ENCOUNTER — Other Ambulatory Visit: Payer: Self-pay | Admitting: Neurology

## 2018-03-20 DIAGNOSIS — G2581 Restless legs syndrome: Secondary | ICD-10-CM

## 2018-03-20 DIAGNOSIS — M792 Neuralgia and neuritis, unspecified: Secondary | ICD-10-CM

## 2018-05-09 DIAGNOSIS — H04123 Dry eye syndrome of bilateral lacrimal glands: Secondary | ICD-10-CM | POA: Diagnosis not present

## 2018-05-09 DIAGNOSIS — H01021 Squamous blepharitis right upper eyelid: Secondary | ICD-10-CM | POA: Diagnosis not present

## 2018-05-09 DIAGNOSIS — H01022 Squamous blepharitis right lower eyelid: Secondary | ICD-10-CM | POA: Diagnosis not present

## 2018-05-09 DIAGNOSIS — H2513 Age-related nuclear cataract, bilateral: Secondary | ICD-10-CM | POA: Diagnosis not present

## 2018-05-09 DIAGNOSIS — H01024 Squamous blepharitis left upper eyelid: Secondary | ICD-10-CM | POA: Diagnosis not present

## 2018-05-09 DIAGNOSIS — H01025 Squamous blepharitis left lower eyelid: Secondary | ICD-10-CM | POA: Diagnosis not present

## 2018-05-16 ENCOUNTER — Ambulatory Visit (INDEPENDENT_AMBULATORY_CARE_PROVIDER_SITE_OTHER): Payer: Medicare Other | Admitting: Podiatry

## 2018-05-16 ENCOUNTER — Encounter: Payer: Self-pay | Admitting: Podiatry

## 2018-05-16 ENCOUNTER — Other Ambulatory Visit: Payer: Self-pay

## 2018-05-16 VITALS — BP 117/70 | HR 55 | Resp 16

## 2018-05-16 DIAGNOSIS — Q828 Other specified congenital malformations of skin: Secondary | ICD-10-CM

## 2018-05-16 NOTE — Progress Notes (Signed)
Subjective:  Patient ID: MCCARTNEY CHUBA, male    DOB: May 09, 1951,  MRN: [id number] HPI Chief Complaint  Patient presents with  . Callouses    Plantar forefoot bilateral - callused areas x 2 years, tries to trim himself but has trouble getting to them now, tender walking when thick  . New Patient (Initial Visit)    67 y.o. male presents with the above complaint.   ROS: Denies fever chills nausea vomiting muscle aches pains calf pain back pain chest pain shortness of breath.  Past Medical History:  Diagnosis Date  . Biceps tendon rupture    h/o Right biceps tendon rupture  . CAD (coronary artery disease)    a. ETT myoview (10/11) with EF 52%, reversible inferior perfusion defect, 1 mm inferolateral ST depression with exercise, 7"16" and stopped due to dyspnea and leg weakness -> LHC (10/11), EF 55%, 40% pRCA, 40% mRCA, 40% prox to mid CFX. medical treatment.  . Cancer ([place])    skin cancer behind left ear   . Dizziness    with bending over   . Hard of hearing    more per right ear  . Headache    with bright light   . History of bronchitis   . Hyperlipidemia   . Impaired glucose tolerance   . Numbness and tingling in left arm    if holds arm upward too long pt states his primary MD is aware  . Osteoarthritis   . Peripheral vascular disease ([place]) 2005   a. s/p left fem-pop Dr.Lawson in 2005.  Marland Kitchen Shortness of breath dyspnea    walking distances or climbing stairs  . Tinnitus    Past Surgical History:  Procedure Laterality Date  . ds/p left femoral artery occlusion  2005   s/p left fem-pop bypass- Dr. Kellie Simmering  . PR VEIN BYPASS GRAFT,AORTO-FEM-POP    . s/p right bicep tendon rupture repair    . VENTRAL HERNIA REPAIR N/A 08/22/2015   Procedure: OPEN VENTRAL HERNIA REPAIR WITH MESH;  Surgeon: Jackolyn Confer, MD;  Location: [facility];  Service: General;  Laterality: N/A;    Current Outpatient Medications:  .  aspirin 81 MG tablet, Take 81 mg by mouth daily. , Disp: , Rfl:  .   gabapentin (NEURONTIN) 300 MG capsule, Take 2 capsules in AM, 3 capsules in evening, Disp: 450 capsule, Rfl: 3 .  nitroGLYCERIN (NITROSTAT) 0.4 MG SL tablet, Place 1 tablet (0.4 mg total) under the tongue every 5 (five) minutes as needed. Place 1 tablet under tongue for chest pain -- not to exceed 3 in 15 minutes, Disp: 100 tablet, Rfl: 11 .  traMADol (ULTRAM) 50 MG tablet, TAKE 1 TABLET BY MOUTH EVERY NIGHT, Disp: 30 tablet, Rfl: 5  No Known Allergies Review of Systems Objective:   Vitals:   05/16/18 0904  BP: 117/70  Pulse: (!) 55  Resp: 16    General: Well developed, nourished, in no acute distress, alert and oriented x3   Dermatological: Skin is warm, dry and supple bilateral. Nails x 10 are well maintained; remaining integument appears unremarkable at this time. There are no open sores, no preulcerative lesions, no rash or signs of infection present.  Vascular: Dorsalis Pedis artery and Posterior Tibial artery pedal pulses are 2/4 bilateral with immedate capillary fill time. Pedal hair growth present. No varicosities and no lower extremity edema present bilateral.   Neruologic: Grossly intact via light touch bilateral. Vibratory intact via tuning fork bilateral. Protective threshold with Semmes Wienstein monofilament  intact to all pedal sites bilateral. Patellar and Achilles deep tendon reflexes 2+ bilateral. No Babinski or clonus noted bilateral.   Musculoskeletal: No gross boney pedal deformities bilateral. No pain, crepitus, or limitation noted with foot and ankle range of motion bilateral. Muscular strength 5/5 in all groups tested bilateral.  Gait: Unassisted, Nonantalgic.    Radiographs:  None taken  Assessment & Plan:   Assessment: Porokeratotic lesions plantar aspect second metatarsal forefoot bilateral.  No open lesions or wounds.  Plan: Sharply debrided all porokeratotic lesions follow-up with me on as-needed basis.    Max T. [place], [place]

## 2018-06-09 DIAGNOSIS — H04123 Dry eye syndrome of bilateral lacrimal glands: Secondary | ICD-10-CM | POA: Diagnosis not present

## 2018-06-09 DIAGNOSIS — H2513 Age-related nuclear cataract, bilateral: Secondary | ICD-10-CM | POA: Diagnosis not present

## 2018-06-09 DIAGNOSIS — H01024 Squamous blepharitis left upper eyelid: Secondary | ICD-10-CM | POA: Diagnosis not present

## 2018-06-09 DIAGNOSIS — H01021 Squamous blepharitis right upper eyelid: Secondary | ICD-10-CM | POA: Diagnosis not present

## 2018-06-09 DIAGNOSIS — H01022 Squamous blepharitis right lower eyelid: Secondary | ICD-10-CM | POA: Diagnosis not present

## 2018-06-09 DIAGNOSIS — H01025 Squamous blepharitis left lower eyelid: Secondary | ICD-10-CM | POA: Diagnosis not present

## 2018-07-21 DIAGNOSIS — H2513 Age-related nuclear cataract, bilateral: Secondary | ICD-10-CM | POA: Diagnosis not present

## 2018-07-21 DIAGNOSIS — H01022 Squamous blepharitis right lower eyelid: Secondary | ICD-10-CM | POA: Diagnosis not present

## 2018-07-21 DIAGNOSIS — H04123 Dry eye syndrome of bilateral lacrimal glands: Secondary | ICD-10-CM | POA: Diagnosis not present

## 2018-07-21 DIAGNOSIS — H01021 Squamous blepharitis right upper eyelid: Secondary | ICD-10-CM | POA: Diagnosis not present

## 2018-07-21 DIAGNOSIS — H01025 Squamous blepharitis left lower eyelid: Secondary | ICD-10-CM | POA: Diagnosis not present

## 2018-07-21 DIAGNOSIS — H01024 Squamous blepharitis left upper eyelid: Secondary | ICD-10-CM | POA: Diagnosis not present

## 2018-08-22 DIAGNOSIS — Z125 Encounter for screening for malignant neoplasm of prostate: Secondary | ICD-10-CM | POA: Diagnosis not present

## 2018-08-22 DIAGNOSIS — R7303 Prediabetes: Secondary | ICD-10-CM | POA: Diagnosis not present

## 2018-08-22 DIAGNOSIS — E039 Hypothyroidism, unspecified: Secondary | ICD-10-CM | POA: Diagnosis not present

## 2018-08-22 DIAGNOSIS — E7800 Pure hypercholesterolemia, unspecified: Secondary | ICD-10-CM | POA: Diagnosis not present

## 2018-09-06 ENCOUNTER — Other Ambulatory Visit: Payer: Self-pay

## 2018-09-06 ENCOUNTER — Ambulatory Visit (INDEPENDENT_AMBULATORY_CARE_PROVIDER_SITE_OTHER): Payer: Medicare Other | Admitting: Neurology

## 2018-09-06 ENCOUNTER — Other Ambulatory Visit (INDEPENDENT_AMBULATORY_CARE_PROVIDER_SITE_OTHER): Payer: Medicare Other

## 2018-09-06 ENCOUNTER — Encounter: Payer: Self-pay | Admitting: Neurology

## 2018-09-06 VITALS — BP 118/62 | HR 67 | Ht 65.0 in | Wt 144.0 lb

## 2018-09-06 DIAGNOSIS — I739 Peripheral vascular disease, unspecified: Secondary | ICD-10-CM

## 2018-09-06 DIAGNOSIS — M79602 Pain in left arm: Secondary | ICD-10-CM

## 2018-09-06 DIAGNOSIS — R404 Transient alteration of awareness: Secondary | ICD-10-CM

## 2018-09-06 DIAGNOSIS — M792 Neuralgia and neuritis, unspecified: Secondary | ICD-10-CM | POA: Diagnosis not present

## 2018-09-06 DIAGNOSIS — S46212A Strain of muscle, fascia and tendon of other parts of biceps, left arm, initial encounter: Secondary | ICD-10-CM

## 2018-09-06 DIAGNOSIS — R292 Abnormal reflex: Secondary | ICD-10-CM

## 2018-09-06 DIAGNOSIS — M25521 Pain in right elbow: Secondary | ICD-10-CM | POA: Diagnosis not present

## 2018-09-06 DIAGNOSIS — G2581 Restless legs syndrome: Secondary | ICD-10-CM

## 2018-09-06 DIAGNOSIS — Z0000 Encounter for general adult medical examination without abnormal findings: Secondary | ICD-10-CM

## 2018-09-06 DIAGNOSIS — G43109 Migraine with aura, not intractable, without status migrainosus: Secondary | ICD-10-CM | POA: Diagnosis not present

## 2018-09-06 DIAGNOSIS — R252 Cramp and spasm: Secondary | ICD-10-CM

## 2018-09-06 DIAGNOSIS — R9431 Abnormal electrocardiogram [ECG] [EKG]: Secondary | ICD-10-CM

## 2018-09-06 DIAGNOSIS — H6123 Impacted cerumen, bilateral: Secondary | ICD-10-CM

## 2018-09-06 DIAGNOSIS — R7302 Impaired glucose tolerance (oral): Secondary | ICD-10-CM

## 2018-09-06 DIAGNOSIS — M79604 Pain in right leg: Secondary | ICD-10-CM

## 2018-09-06 LAB — FERRITIN: Ferritin: 83.9 ng/mL (ref 22.0–322.0)

## 2018-09-06 MED ORDER — GABAPENTIN 300 MG PO CAPS: ORAL_CAPSULE | ORAL | 3 refills | Status: DC

## 2018-09-06 MED ORDER — TRAMADOL HCL 50 MG PO TABS: ORAL_TABLET | ORAL | 5 refills | Status: DC

## 2018-09-06 MED ORDER — ROPINIROLE HCL 0.25 MG PO TABS: ORAL_TABLET | ORAL | 11 refills | Status: DC

## 2018-09-06 NOTE — Patient Instructions (Addendum)
1. Bloodwork for ferritin  2. Start Ropinorole 0.25mg  1 tablet 2 hours prior to bedtime. If too drowsy in the morning, take 1/2 tablet  3. Continue gabapentin 300mg : take 2 caps in AM, 3 caps in PM  4. Continue Tramadol 50mg  every night  5. Follow-up in 6 months, call for any changes
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
## 2018-09-08 ENCOUNTER — Telehealth: Payer: Self-pay

## 2018-09-08 NOTE — Telephone Encounter (Signed)
-----   Message from Cameron Sprang, MD sent at 09/06/2018 12:27 PM EDT ----- Pls let him know iron level is normal, thanks

## 2018-09-08 NOTE — Telephone Encounter (Signed)
LMOM relaying message below.  

## 2018-09-20 NOTE — Progress Notes (Signed)
HPI: FU nonobstructive CAD, PVD s/p previous left fem-pop in 2005 followed by vascular, h/o longstanding brief atypical chest pains. H/o nuc in 2011 showing EF 52% and reversible inferior defect. Subsequent cath 10/11 showed 40% prox RCA, 40% mid RCA, 50% prox-to-mid Cx, EF 55% with some basal inferior HK. Screening AAA duplex negative for aneurysm 09/2014. Nuclear study repeated August 2016 and showed ejection fraction 43%. There was normal perfusion. Echocardiogram August 2016 showed normal LV function with ejection fraction 60-65% and grade 1 diastolic dysfunction. Lower extremity arterial Dopplers December 2018 showed patent femoropopliteal on the left.  Since last seen,  patient denies dyspnea, chest pain, palpitations or syncope.  Current Outpatient Medications  Medication Sig Dispense Refill  . aspirin 81 MG tablet Take 81 mg by mouth daily.     Marland Kitchen gabapentin (NEURONTIN) 300 MG capsule Take 2 capsules in AM, 3 capsules in evening 450 capsule 3  . nitroGLYCERIN (NITROSTAT) 0.4 MG SL tablet Place 1 tablet (0.4 mg total) under the tongue every 5 (five) minutes as needed. Place 1 tablet under tongue for chest pain -- not to exceed 3 in 15 minutes 100 tablet 11  . rOPINIRole (REQUIP) 0.25 MG tablet Take 1 tablet 2 hours before bedtime 30 tablet 11  . traMADol (ULTRAM) 50 MG tablet TAKE 1 TABLET BY MOUTH EVERY NIGHT 30 tablet 5   No current facility-administered medications for this visit.      Past Medical History:  Diagnosis Date  . Biceps tendon rupture    h/o Right biceps tendon rupture  . CAD (coronary artery disease)    a. ETT myoview (10/11) with EF 52%, reversible inferior perfusion defect, 1 mm inferolateral ST depression with exercise, 7"16" and stopped due to dyspnea and leg weakness -> LHC (10/11), EF 55%, 40% pRCA, 40% mRCA, 40% prox to mid CFX. medical treatment.  . Cancer ([place])    skin cancer behind left ear   . Dizziness    with bending over   . Hard of hearing    more per right ear  . Headache    with bright light   . History of bronchitis   . Hyperlipidemia   . Impaired glucose tolerance   . Numbness and tingling in left arm    if holds arm upward too long pt states his primary MD is aware  . Osteoarthritis   . Peripheral vascular disease ([place]) 2005   a. s/p left fem-pop Dr.Lawson in 2005.  Marland Kitchen Shortness of breath dyspnea    walking distances or climbing stairs  . Tinnitus     Past Surgical History:  Procedure Laterality Date  . ds/p left femoral artery occlusion  2005   s/p left fem-pop bypass- Dr. Kellie Simmering  . PR VEIN BYPASS GRAFT,AORTO-FEM-POP    . s/p right bicep tendon rupture repair    . VENTRAL HERNIA REPAIR N/A 08/22/2015   Procedure: OPEN VENTRAL HERNIA REPAIR WITH MESH;  Surgeon: Jackolyn Confer, MD;  Location: [facility];  Service: General;  Laterality: N/A;    Social History   Socioeconomic History  . Marital status: Married    Spouse name: Not on file  . Number of children: 1  . Years of education: Not on file  . Highest education level: Not on file  Occupational History  . Occupation: Social research officer, government  Social Needs  . Financial resource strain: Not on file  . Food insecurity:    Worry: Not on file    Inability: Not  on file  . Transportation needs:    Medical: Not on file    Non-medical: Not on file  Tobacco Use  . Smoking status: Former Smoker    Packs/day: 7.00    Years: 40.00    Pack years: 280.00    Types: Cigarettes    Last attempt to quit: 11/29/1994    Years since quitting: 23.8  . Smokeless tobacco: Never Used  Substance and Sexual Activity  . Alcohol use: No  . Drug use: No  . Sexual activity: Not on file  Lifestyle  . Physical activity:    Days per week: Not on file    Minutes per session: Not on file  . Stress: Not on file  Relationships  . Social connections:    Talks on phone: Not on file    Gets together: Not on file    Attends religious service: Not on file    Active member of club or  organization: Not on file    Attends meetings of clubs or organizations: Not on file    Relationship status: Not on file  . Intimate partner violence:    Fear of current or ex partner: Not on file    Emotionally abused: Not on file    Physically abused: Not on file    Forced sexual activity: Not on file  Other Topics Concern  . Not on file  Social History Narrative  . Not on file    Family History  Problem Relation Age of Onset  . Cervical cancer Mother   . Cancer Mother        cervical  . Lung cancer Father   . Cancer Father        lung    ROS: Some leg pain but no fevers or chills, productive cough, hemoptysis, dysphasia, odynophagia, melena, hematochezia, dysuria, hematuria, rash, seizure activity, orthopnea, PND, pedal edema, claudication. Remaining systems are negative.  Physical Exam: Well-developed well-nourished in no acute distress.  Skin is warm and dry.  HEENT is normal.  Neck is supple.  Chest is clear to auscultation with normal expansion.  Cardiovascular exam is regular rate and rhythm.  Abdominal exam nontender or distended. No masses palpated. Extremities show no edema. neuro grossly intact  ECG-sinus bradycardia at a rate of 53.  No ST changes.  Personally reviewed  A/P  1 coronary artery disease-patient is not having chest pain.  Continue medical therapy with aspirin.  He is intolerant to statins.  2 hyperlipidemia-intolerant to statins. Check lipids.  If LDL greater than 70 would consider zetia or Repatha.  3 peripheral vascular disease-followed by vascular surgery.  Continue medical therapy.  4 elevated blood pressure-patient's blood pressure is elevated today.  He does not carry the diagnosis of hypertension.  I have asked him to follow his blood pressure at home and we will initiate therapy if systolic averages greater than 469 or diastolic greater than 85.  Kirk Ruths, MD

## 2018-09-26 ENCOUNTER — Encounter: Payer: Self-pay | Admitting: Cardiology

## 2018-09-26 ENCOUNTER — Ambulatory Visit (INDEPENDENT_AMBULATORY_CARE_PROVIDER_SITE_OTHER): Payer: Medicare Other | Admitting: Cardiology

## 2018-09-26 VITALS — BP 146/84 | HR 59 | Resp 16 | Ht 64.5 in | Wt 142.4 lb

## 2018-09-26 DIAGNOSIS — I2510 Atherosclerotic heart disease of native coronary artery without angina pectoris: Secondary | ICD-10-CM | POA: Diagnosis not present

## 2018-09-26 DIAGNOSIS — I739 Peripheral vascular disease, unspecified: Secondary | ICD-10-CM

## 2018-09-26 DIAGNOSIS — E7800 Pure hypercholesterolemia, unspecified: Secondary | ICD-10-CM | POA: Diagnosis not present

## 2018-09-26 NOTE — Patient Instructions (Signed)
Medication Instructions:  NO CHANGE If you need a refill on your cardiac medications before your next appointment, please call your pharmacy.   Lab work: Your physician recommends that you return for lab work PRIOR TO EATING If you have labs (blood work) drawn today and your tests are completely normal, you will receive your results only by: Marland Kitchen MyChart Message (if you have MyChart) OR . A paper copy in the mail If you have any lab test that is abnormal or we need to change your treatment, we will call you to review the results.  Follow-Up: At [facility], you and your health needs are our priority.  As part of our continuing mission to provide you with exceptional heart care, we have created designated Provider Care Teams.  These Care Teams include your primary Cardiologist (physician) and Advanced Practice Providers (APPs -  Physician Assistants and Nurse Practitioners) who all work together to provide you with the care you need, when you need it. You will need a follow up appointment in 12 months.  Please call our office 2 months in advance to schedule this appointment.  You may see Kirk Ruths MD or one of the following Advanced Practice Providers on your designated Care Team:   Kerin Ransom, PA-C Roby Lofts, [place] . Sande Rives, PA-C  BLOOD PRESSURE GOAL IS 130/85

## 2018-09-28 DIAGNOSIS — E7800 Pure hypercholesterolemia, unspecified: Secondary | ICD-10-CM | POA: Diagnosis not present

## 2018-10-19 DIAGNOSIS — Z23 Encounter for immunization: Secondary | ICD-10-CM | POA: Diagnosis not present

## 2018-11-02 DIAGNOSIS — J040 Acute laryngitis: Secondary | ICD-10-CM | POA: Diagnosis not present

## 2018-11-16 ENCOUNTER — Encounter: Payer: Self-pay | Admitting: *Deleted

## 2018-12-20 ENCOUNTER — Other Ambulatory Visit: Payer: Self-pay | Admitting: Neurology

## 2018-12-20 DIAGNOSIS — M792 Neuralgia and neuritis, unspecified: Secondary | ICD-10-CM

## 2018-12-20 DIAGNOSIS — G2581 Restless legs syndrome: Secondary | ICD-10-CM

## 2018-12-20 NOTE — Telephone Encounter (Signed)
Patients wife left vm about tramadol prescription and not having it sent in. Please send. Thanks!

## 2018-12-20 NOTE — Telephone Encounter (Signed)
Tramadol 50mg  #30 with 5 refills Sig = 1 Tab QHS  Forwarded to Dr. Delice Lesch for approval

## 2018-12-21 ENCOUNTER — Other Ambulatory Visit: Payer: Self-pay | Admitting: Neurology

## 2018-12-21 DIAGNOSIS — G2581 Restless legs syndrome: Secondary | ICD-10-CM

## 2018-12-21 DIAGNOSIS — M792 Neuralgia and neuritis, unspecified: Secondary | ICD-10-CM

## 2018-12-21 MED ORDER — TRAMADOL HCL 50 MG PO TABS: ORAL_TABLET | ORAL | 5 refills | Status: DC

## 2018-12-21 NOTE — Telephone Encounter (Signed)
Patient's wife called regarding his Tramadol medication. He has been out a week. She [facility] is out and does not have any. She said he needs the medication. Please Call. Thanks

## 2018-12-22 MED ORDER — TRAMADOL HCL 50 MG PO TABS: ORAL_TABLET | ORAL | 0 refills | Status: DC

## 2018-12-22 NOTE — Telephone Encounter (Signed)
Spoke with pt and wife, [name].  They states that [facility] has been out of Tramadol for roughly 2 weeks now.  Ask that I send 90 day Rx to [facility] on [address].  Rx written and forwarded to Dr. Delice Lesch for approval.

## 2019-01-22 DIAGNOSIS — H2513 Age-related nuclear cataract, bilateral: Secondary | ICD-10-CM | POA: Diagnosis not present

## 2019-01-22 DIAGNOSIS — H1045 Other chronic allergic conjunctivitis: Secondary | ICD-10-CM | POA: Diagnosis not present

## 2019-01-22 DIAGNOSIS — H04123 Dry eye syndrome of bilateral lacrimal glands: Secondary | ICD-10-CM | POA: Diagnosis not present

## 2019-01-22 DIAGNOSIS — H02886 Meibomian gland dysfunction of left eye, unspecified eyelid: Secondary | ICD-10-CM | POA: Diagnosis not present

## 2019-01-22 DIAGNOSIS — H02883 Meibomian gland dysfunction of right eye, unspecified eyelid: Secondary | ICD-10-CM | POA: Diagnosis not present

## 2019-01-29 DIAGNOSIS — J399 Disease of upper respiratory tract, unspecified: Secondary | ICD-10-CM | POA: Diagnosis not present

## 2019-01-29 DIAGNOSIS — E039 Hypothyroidism, unspecified: Secondary | ICD-10-CM | POA: Diagnosis not present

## 2019-02-26 DIAGNOSIS — M19041 Primary osteoarthritis, right hand: Secondary | ICD-10-CM | POA: Diagnosis not present

## 2019-02-26 DIAGNOSIS — I25119 Atherosclerotic heart disease of native coronary artery with unspecified angina pectoris: Secondary | ICD-10-CM | POA: Diagnosis not present

## 2019-02-26 DIAGNOSIS — E785 Hyperlipidemia, unspecified: Secondary | ICD-10-CM | POA: Diagnosis not present

## 2019-02-26 DIAGNOSIS — Z008 Encounter for other general examination: Secondary | ICD-10-CM | POA: Diagnosis not present

## 2019-02-26 DIAGNOSIS — I739 Peripheral vascular disease, unspecified: Secondary | ICD-10-CM | POA: Diagnosis not present

## 2019-02-26 DIAGNOSIS — H9190 Unspecified hearing loss, unspecified ear: Secondary | ICD-10-CM | POA: Diagnosis not present

## 2019-02-26 DIAGNOSIS — M62838 Other muscle spasm: Secondary | ICD-10-CM | POA: Diagnosis not present

## 2019-02-26 DIAGNOSIS — Z0000 Encounter for general adult medical examination without abnormal findings: Secondary | ICD-10-CM | POA: Diagnosis not present

## 2019-02-26 DIAGNOSIS — G2581 Restless legs syndrome: Secondary | ICD-10-CM | POA: Diagnosis not present

## 2019-04-16 ENCOUNTER — Encounter: Payer: Self-pay | Admitting: *Deleted

## 2019-04-17 ENCOUNTER — Telehealth (INDEPENDENT_AMBULATORY_CARE_PROVIDER_SITE_OTHER): Payer: Medicare HMO | Admitting: Neurology

## 2019-04-17 ENCOUNTER — Other Ambulatory Visit: Payer: Self-pay

## 2019-04-17 DIAGNOSIS — G43109 Migraine with aura, not intractable, without status migrainosus: Secondary | ICD-10-CM

## 2019-04-17 DIAGNOSIS — G2581 Restless legs syndrome: Secondary | ICD-10-CM

## 2019-04-17 MED ORDER — TRAMADOL HCL 50 MG PO TABS: ORAL_TABLET | ORAL | 3 refills | Status: DC

## 2019-04-17 MED ORDER — GABAPENTIN 300 MG PO CAPS: ORAL_CAPSULE | ORAL | 3 refills | Status: DC
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
## 2019-04-25 ENCOUNTER — Encounter

## 2019-04-25 ENCOUNTER — Ambulatory Visit: Payer: Medicare Other | Admitting: Neurology

## 2019-06-28 ENCOUNTER — Encounter (INDEPENDENT_AMBULATORY_CARE_PROVIDER_SITE_OTHER): Payer: Medicare HMO | Admitting: Vascular Surgery

## 2019-07-09 ENCOUNTER — Other Ambulatory Visit: Payer: Self-pay

## 2019-07-09 ENCOUNTER — Ambulatory Visit (INDEPENDENT_AMBULATORY_CARE_PROVIDER_SITE_OTHER): Payer: Medicare HMO | Admitting: Vascular Surgery

## 2019-07-09 ENCOUNTER — Encounter (INDEPENDENT_AMBULATORY_CARE_PROVIDER_SITE_OTHER): Payer: Self-pay | Admitting: Vascular Surgery

## 2019-07-09 ENCOUNTER — Encounter (INDEPENDENT_AMBULATORY_CARE_PROVIDER_SITE_OTHER): Payer: Self-pay

## 2019-07-09 VITALS — BP 138/75 | HR 64 | Resp 16 | Ht 64.5 in | Wt 138.0 lb

## 2019-07-09 DIAGNOSIS — G629 Polyneuropathy, unspecified: Secondary | ICD-10-CM

## 2019-07-09 DIAGNOSIS — E7800 Pure hypercholesterolemia, unspecified: Secondary | ICD-10-CM

## 2019-07-09 DIAGNOSIS — I70212 Atherosclerosis of native arteries of extremities with intermittent claudication, left leg: Secondary | ICD-10-CM

## 2019-07-09 DIAGNOSIS — M8949 Other hypertrophic osteoarthropathy, multiple sites: Secondary | ICD-10-CM

## 2019-07-09 DIAGNOSIS — M150 Primary generalized (osteo)arthritis: Secondary | ICD-10-CM

## 2019-07-09 DIAGNOSIS — M159 Polyosteoarthritis, unspecified: Secondary | ICD-10-CM

## 2019-07-09 NOTE — Progress Notes (Signed)
MRN : [id number]  Willie Kennedy is a 68 y.o. (1951-02-13) male who presents with chief complaint of  Chief Complaint  Patient presents with  . New Patient (Initial Visit)    ref Arline Asp for PAD  .  History of Present Illness:    The patient is seen for evaluation of painful lower extremities and diminished pulses. Patient notes the pain is always associated with activity and is very consistent day today. He notes it is most severe in the left calf but extends into the thighs and buttocks.  Typically, the pain occurs at less than one block, progress is as activity continues to the point that the patient must stop walking. Resting including standing still for several minutes allowed resumption of the activity and the ability to walk a similar distance before stopping again. Uneven terrain and inclined shorten the distance. The pain has been progressive over the past several years. The patient states the inability to walk is now having a profound negative impact on quality of life and daily activities.  He is status post femoral-popliteal bypass at [facility] about 20 years ago.  He has not been followed recently.  His bypass was successful but complicated with a neuropathic pain syndrome.  He describes this neuropathic pain as located in the medial knee and calf region that radiates down to the dorsum of his foot.  This occurred shortly after his bypass.  This is been a vexing problem for him for many years.  The patient denies rest pain or dangling of an extremity off the side of the bed during the night for relief. No open wounds or sores at this time.  No history of back problems or DJD of the lumbar sacral spine but he has an extensive history of DJD multiple sites.  And he does note worsening lower back pain over the past year or 2.  The patient denies changes in claudication symptoms or new rest pain symptoms which have developed acutely over the past few months.  No new ulcers or wounds  of the foot.  However, he feels that overall he has been having a significant and steady worsening of his claudication and pain  The patient's blood pressure has been stable and relatively well controlled. The patient denies amaurosis fugax or recent TIA symptoms. There are no recent neurological changes noted. The patient denies history of DVT, PE or superficial thrombophlebitis. The patient denies recent episodes of angina or shortness of breath.   Current Meds  Medication Sig  . aspirin 81 MG tablet Take 81 mg by mouth daily.   Marland Kitchen gabapentin (NEURONTIN) 300 MG capsule Take 2 capsules in AM, 3 capsules in evening  . nitroGLYCERIN (NITROSTAT) 0.4 MG SL tablet Place 1 tablet (0.4 mg total) under the tongue every 5 (five) minutes as needed. Place 1 tablet under tongue for chest pain -- not to exceed 3 in 15 minutes  . rOPINIRole (REQUIP) 0.25 MG tablet TAKE 1 TABLET BY MOUTH 2 HOURS BEFORE BEDTIME  . traMADol (ULTRAM) 50 MG tablet TAKE 1 TABLET BY MOUTH EVERY NIGHT    Past Medical History:  Diagnosis Date  . Biceps tendon rupture    h/o Right biceps tendon rupture  . CAD (coronary artery disease)    a. ETT myoview (10/11) with EF 52%, reversible inferior perfusion defect, 1 mm inferolateral ST depression with exercise, 7"16" and stopped due to dyspnea and leg weakness -> LHC (10/11), EF 55%, 40% pRCA, 40% mRCA, 40% prox to  mid CFX. medical treatment.  . Cancer ([place])    skin cancer behind left ear   . Dizziness    with bending over   . Hard of hearing    more per right ear  . Headache    with bright light   . History of bronchitis   . Hyperlipidemia   . Impaired glucose tolerance   . Numbness and tingling in left arm    if holds arm upward too long pt states his primary MD is aware  . Osteoarthritis   . Peripheral vascular disease ([place]) 2005   a. s/p left fem-pop Dr.Lawson in 2005.  Marland Kitchen Shortness of breath dyspnea    walking distances or climbing stairs  . Tinnitus     Past  Surgical History:  Procedure Laterality Date  . ds/p left femoral artery occlusion  2005   s/p left fem-pop bypass- Dr. Kellie Simmering  . PR VEIN BYPASS GRAFT,AORTO-FEM-POP    . s/p right bicep tendon rupture repair    . VENTRAL HERNIA REPAIR N/A 08/22/2015   Procedure: OPEN VENTRAL HERNIA REPAIR WITH MESH;  Surgeon: Jackolyn Confer, MD;  Location: [facility];  Service: General;  Laterality: N/A;    Social History Social History   Tobacco Use  . Smoking status: Former Smoker    Packs/day: 7.00    Years: 40.00    Pack years: 280.00    Types: Cigarettes    Quit date: 11/29/1994    Years since quitting: 24.6  . Smokeless tobacco: Never Used  Substance Use Topics  . Alcohol use: No  . Drug use: No    Family History Family History  Problem Relation Age of Onset  . Cervical cancer Mother   . Cancer Mother        cervical  . Lung cancer Father   . Cancer Father        lung  No family history of bleeding/clotting disorders, porphyria or autoimmune disease   No Known Allergies   REVIEW OF SYSTEMS (Negative unless checked)  Constitutional: [] Weight loss  [] Fever  [] Chills Cardiac: [] Chest pain   [] Chest pressure   [] Palpitations   [] Shortness of breath when laying flat   [] Shortness of breath with exertion. Vascular:  [x] Pain in legs with walking   [x] Pain in legs at rest  [] History of DVT   [] Phlebitis   [] Swelling in legs   [] Varicose veins   [] Non-healing ulcers Pulmonary:   [] Uses home oxygen   [] Productive cough   [] Hemoptysis   [] Wheeze  [] COPD   [] Asthma Neurologic:  [] Dizziness   [] Seizures   [] History of stroke   [] History of TIA  [] Aphasia   [] Vissual changes   [] Weakness or numbness in arm   [x] Weakness or numbness in leg Musculoskeletal:   [] Joint swelling   [x] Joint pain   [x] Low back pain Hematologic:  [] Easy bruising  [] Easy bleeding   [] Hypercoagulable state   [] Anemic Gastrointestinal:  [] Diarrhea   [] Vomiting  [] Gastroesophageal reflux/heartburn   [] Difficulty swallowing.  Genitourinary:  [] Chronic kidney disease   [] Difficult urination  [] Frequent urination   [] Blood in urine Skin:  [] Rashes   [] Ulcers  Psychological:  [] History of anxiety   []  History of major depression.  Physical Examination  Vitals:   07/09/19 1517  BP: 138/75  Pulse: 64  Resp: 16  Weight: 138 lb (62.6 kg)  Height: 5' 4.5" (1.638 m)   Body mass index is 23.32 kg/m. Gen: WD/WN, NAD Head: [place]/AT, No temporalis wasting.  Ear/Nose/Throat: Hearing grossly  intact, nares w/o erythema or drainage, poor dentition Eyes: PER, EOMI, sclera nonicteric.  Neck: Supple, no masses.  No bruit or JVD.  Pulmonary:  Good air movement, clear to auscultation bilaterally, no use of accessory muscles.  Cardiac: RRR, normal S1, S2, no Murmurs. Vascular: Scars consistent with left femoral-popliteal bypass. Vessel Right Left  Radial Palpable Palpable  PT Not Palpable Not Palpable  DP 1+ Palpable Trace Palpable  Gastrointestinal: soft, non-distended. No guarding/no peritoneal signs.  Musculoskeletal: M/S 5/5 throughout.  No deformity or atrophy.  Neurologic: CN 2-12 intact. Pain and light touch intact in extremities.  Symmetrical.  Speech is fluent. Motor exam as listed above. Psychiatric: Judgment intact, Mood & affect appropriate for pt's clinical situation. Dermatologic: No rashes or ulcers noted.  No changes consistent with cellulitis. Lymph : No Cervical lymphadenopathy, no lichenification or skin changes of chronic lymphedema.  CBC Lab Results  Component Value Date   WBC 5.0 08/15/2015   HGB 13.8 08/15/2015   HCT 42.1 08/15/2015   MCV 93.1 08/15/2015   PLT 219 08/15/2015    BMET    Component Value Date/Time   NA 139 08/15/2015 0910   K 4.3 08/15/2015 0910   CL 105 08/15/2015 0910   CO2 27 08/15/2015 0910   GLUCOSE 110 (H) 08/15/2015 0910   BUN 15 08/15/2015 0910   CREATININE 1.02 08/15/2015 0910   CREATININE 1.00 06/27/2015 1222   CALCIUM 8.7 (L) 08/15/2015 0910   GFRNONAA >60  08/15/2015 0910   GFRAA >60 08/15/2015 0910   CrCl cannot be calculated (Patient's most recent lab result is older than the maximum 21 days allowed.).  COAG Lab Results  Component Value Date   INR 1.03 08/15/2015   INR 0.9 ratio 09/15/2010    Radiology No results found.   Assessment/Plan 1. Atherosclerosis of native artery of left lower extremity with intermittent claudication (HCC) Recommend:  Patient should undergo arterial duplex of the lower extremity ASAP because there has been a significant deterioration in the patient's lower extremity symptoms.  The patient states they are having increased pain and a marked decrease in the distance that they can walk.  The risks and benefits as well as the alternatives were discussed in detail with the patient.  All questions were answered.  Patient agrees to proceed and understands this could be a prelude to angiography and intervention.  The patient will follow up with me in the office to review the studies.  - VAS US AORTA/IVC/ILIACS; Future - VAS [place] LOWER EXTREMITY ARTERIAL DUPLEX; Future - VAS [place] ABI WITH/WO TBI; Future  2. Neuropathy We will review his noninvasive studies.  This is a longstanding problem for which the patient focuses significantly on.  I tried to impress upon him that we may not be able to alter this or improve this as it may be a purely neuropathic issue and not a issue secondary to worsening vascular disease.  Of note he does have a palpable pulse in his left foot indicating his bypass is likely still patent.  His symptoms do appear to be in a radicular distribution consistent with the L4 perhaps L4 and L5 and so once his noninvasives are reviewed I may need to discuss with Dr. Arline Asp and evaluation with pain management  3. Primary osteoarthritis involving multiple joints Continue NSAID medications as already ordered, these medications have been reviewed and there are no changes at this time.  Continued activity and  therapy was stressed.   4. Pure hypercholesterolemia Continue statin as  ordered and reviewed, no changes at this time     Hortencia Pilar, MD  07/09/2019 5:15 PM

## 2019-07-11 DIAGNOSIS — G629 Polyneuropathy, unspecified: Secondary | ICD-10-CM | POA: Insufficient documentation

## 2019-07-11 DIAGNOSIS — M1990 Unspecified osteoarthritis, unspecified site: Secondary | ICD-10-CM | POA: Insufficient documentation

## 2019-07-30 ENCOUNTER — Ambulatory Visit (INDEPENDENT_AMBULATORY_CARE_PROVIDER_SITE_OTHER): Payer: Medicare HMO

## 2019-07-30 ENCOUNTER — Ambulatory Visit (INDEPENDENT_AMBULATORY_CARE_PROVIDER_SITE_OTHER): Payer: Medicare HMO | Admitting: Vascular Surgery

## 2019-07-30 ENCOUNTER — Other Ambulatory Visit: Payer: Self-pay

## 2019-07-30 ENCOUNTER — Encounter (INDEPENDENT_AMBULATORY_CARE_PROVIDER_SITE_OTHER): Payer: Self-pay | Admitting: Vascular Surgery

## 2019-07-30 VITALS — BP 154/82 | HR 50 | Resp 16 | Ht 65.0 in | Wt 136.0 lb

## 2019-07-30 DIAGNOSIS — M150 Primary generalized (osteo)arthritis: Secondary | ICD-10-CM | POA: Diagnosis not present

## 2019-07-30 DIAGNOSIS — M8949 Other hypertrophic osteoarthropathy, multiple sites: Secondary | ICD-10-CM

## 2019-07-30 DIAGNOSIS — I70212 Atherosclerosis of native arteries of extremities with intermittent claudication, left leg: Secondary | ICD-10-CM | POA: Diagnosis not present

## 2019-07-30 DIAGNOSIS — M792 Neuralgia and neuritis, unspecified: Secondary | ICD-10-CM

## 2019-07-30 DIAGNOSIS — M159 Polyosteoarthritis, unspecified: Secondary | ICD-10-CM

## 2019-07-30 DIAGNOSIS — I2510 Atherosclerotic heart disease of native coronary artery without angina pectoris: Secondary | ICD-10-CM

## 2019-07-30 DIAGNOSIS — E7800 Pure hypercholesterolemia, unspecified: Secondary | ICD-10-CM

## 2019-07-30 NOTE — Progress Notes (Signed)
MRN : [id number]  Willie Kennedy is a 68 y.o. (Dec 06, 1950) male who presents with chief complaint of  Chief Complaint  Patient presents with  . Follow-up    ultrasound follow up  .  History of Present Illness:   The patient returns to the office for followup and review of the noninvasive studies. There have been no interval changes in lower extremity symptoms. No interval shortening of the patient's claudication distance or development of rest pain symptoms. No new ulcers or wounds have occurred since the last visit.  There have been no significant changes to the patient's overall health care.  The patient denies amaurosis fugax or recent TIA symptoms. There are no recent neurological changes noted. The patient denies history of DVT, PE or superficial thrombophlebitis. The patient denies recent episodes of angina or shortness of breath.   ABI Rt=1.06 and Lt=1.08  (previous ABI's Rt=0.97 and Lt=1.05)  Duplex ultrasound of the aorta iliac system demonstrates uniform flow with triphasic signals.  The maximal diameter of the abdominal aorta is 1.95 cm.  No evidence of hemodynamically significant lesions.  Duplex ultrasound of the left lower extremity demonstrates a widely patent bypass graft with triphasic signals throughout.  Current Meds  Medication Sig  . aspirin 81 MG tablet Take 81 mg by mouth daily.   Marland Kitchen gabapentin (NEURONTIN) 300 MG capsule Take 2 capsules in AM, 3 capsules in evening  . nitroGLYCERIN (NITROSTAT) 0.4 MG SL tablet Place 1 tablet (0.4 mg total) under the tongue every 5 (five) minutes as needed. Place 1 tablet under tongue for chest pain -- not to exceed 3 in 15 minutes  . rOPINIRole (REQUIP) 0.25 MG tablet TAKE 1 TABLET BY MOUTH 2 HOURS BEFORE BEDTIME  . traMADol (ULTRAM) 50 MG tablet TAKE 1 TABLET BY MOUTH EVERY NIGHT    Past Medical History:  Diagnosis Date  . Biceps tendon rupture    h/o Right biceps tendon rupture  . CAD (coronary artery disease)    a.  ETT myoview (10/11) with EF 52%, reversible inferior perfusion defect, 1 mm inferolateral ST depression with exercise, 7"16" and stopped due to dyspnea and leg weakness -> LHC (10/11), EF 55%, 40% pRCA, 40% mRCA, 40% prox to mid CFX. medical treatment.  . Cancer ([place])    skin cancer behind left ear   . Dizziness    with bending over   . Hard of hearing    more per right ear  . Headache    with bright light   . History of bronchitis   . Hyperlipidemia   . Impaired glucose tolerance   . Numbness and tingling in left arm    if holds arm upward too long pt states his primary MD is aware  . Osteoarthritis   . Peripheral vascular disease ([place]) 2005   a. s/p left fem-pop Dr.Lawson in 2005.  Marland Kitchen Shortness of breath dyspnea    walking distances or climbing stairs  . Tinnitus     Past Surgical History:  Procedure Laterality Date  . ds/p left femoral artery occlusion  2005   s/p left fem-pop bypass- Dr. Kellie Simmering  . PR VEIN BYPASS GRAFT,AORTO-FEM-POP    . s/p right bicep tendon rupture repair    . VENTRAL HERNIA REPAIR N/A 08/22/2015   Procedure: OPEN VENTRAL HERNIA REPAIR WITH MESH;  Surgeon: Jackolyn Confer, MD;  Location: [facility];  Service: General;  Laterality: N/A;    Social History Social History   Tobacco Use  . Smoking  status: Former Smoker    Packs/day: 7.00    Years: 40.00    Pack years: 280.00    Types: Cigarettes    Quit date: 11/29/1994    Years since quitting: 24.6  . Smokeless tobacco: Never Used  Substance Use Topics  . Alcohol use: No  . Drug use: No    Family History Family History  Problem Relation Age of Onset  . Cervical cancer Mother   . Cancer Mother        cervical  . Lung cancer Father   . Cancer Father        lung    No Known Allergies   REVIEW OF SYSTEMS (Negative unless checked)  Constitutional: [] Weight loss  [] Fever  [] Chills Cardiac: [] Chest pain   [] Chest pressure   [] Palpitations   [] Shortness of breath when laying flat   [] Shortness of  breath with exertion. Vascular:  [x] Pain in legs with walking   [x] Pain in legs at rest  [] History of DVT   [] Phlebitis   [] Swelling in legs   [] Varicose veins   [] Non-healing ulcers Pulmonary:   [] Uses home oxygen   [] Productive cough   [] Hemoptysis   [] Wheeze  [] COPD   [] Asthma Neurologic:  [] Dizziness   [] Seizures   [] History of stroke   [] History of TIA  [] Aphasia   [] Vissual changes   [] Weakness or numbness in arm   [] Weakness or numbness in leg Musculoskeletal:   [] Joint swelling   [x] Joint pain   [x] Low back pain Hematologic:  [] Easy bruising  [] Easy bleeding   [] Hypercoagulable state   [] Anemic Gastrointestinal:  [] Diarrhea   [] Vomiting  [] Gastroesophageal reflux/heartburn   [] Difficulty swallowing. Genitourinary:  [] Chronic kidney disease   [] Difficult urination  [] Frequent urination   [] Blood in urine Skin:  [] Rashes   [] Ulcers  Psychological:  [] History of anxiety   []  History of major depression.  Physical Examination  Vitals:   07/30/19 0929  BP: (!) 154/82  Pulse: (!) 50  Resp: 16  Weight: 136 lb (61.7 kg)  Height: 5\' 5"  (1.651 m)   Body mass index is 22.63 kg/m. Gen: WD/WN, NAD Head: [place]/AT, No temporalis wasting.  Ear/Nose/Throat: Hearing grossly intact, nares w/o erythema or drainage Eyes: PER, EOMI, sclera nonicteric.  Neck: Supple, no large masses.   Pulmonary:  Good air movement, no audible wheezing bilaterally, no use of accessory muscles.  Cardiac: RRR, no JVD Vascular: Scars left lower extremity consistent with previous bypass.  They are well-healed Vessel Right Left  Radial Palpable Palpable  PT Palpable Palpable  DP Palpable Palpable  Gastrointestinal: Non-distended. No guarding/no peritoneal signs.  Musculoskeletal: M/S 5/5 throughout.  No deformity or atrophy.  Neurologic: CN 2-12 intact. Symmetrical.  Speech is fluent. Motor exam as listed above. Psychiatric: Judgment intact, Mood & affect appropriate for pt's clinical situation. Dermatologic: No  rashes or ulcers noted.  No changes consistent with cellulitis. Lymph : No lichenification or skin changes of chronic lymphedema.  CBC Lab Results  Component Value Date   WBC 5.0 08/15/2015   HGB 13.8 08/15/2015   HCT 42.1 08/15/2015   MCV 93.1 08/15/2015   PLT 219 08/15/2015    BMET    Component Value Date/Time   NA 139 08/15/2015 0910   K 4.3 08/15/2015 0910   CL 105 08/15/2015 0910   CO2 27 08/15/2015 0910   GLUCOSE 110 (H) 08/15/2015 0910   BUN 15 08/15/2015 0910   CREATININE 1.02 08/15/2015 0910   CREATININE 1.00 06/27/2015 1222  CALCIUM 8.7 (L) 08/15/2015 0910   GFRNONAA >60 08/15/2015 0910   GFRAA >60 08/15/2015 0910   CrCl cannot be calculated (Patient's most recent lab result is older than the maximum 21 days allowed.).  COAG Lab Results  Component Value Date   INR 1.03 08/15/2015   INR 0.9 ratio 09/15/2010    Radiology No results found.   Assessment/Plan 1. Atherosclerosis of native artery of left lower extremity with intermittent claudication (HCC)  Recommend:  The patient has evidence of atherosclerosis of the lower extremities with claudication.  The patient's noninvasive studies are all within the normal range.  He has triphasic signals throughout bilaterally.  His bypass appears to be widely patent.  In comparison to previous studies from main campus his studies have remained stable.  Given these above findings his leg pain with appear to be more neuropathic in etiology.  These findings would be consistent with a moderate to severe pseudoclaudication.  I would defer further work-up and the possibility of a block or use of a TENS unit to his primary service or a pain management service.  No invasive studies, angiography or surgery at this time The patient should continue walking and begin a more formal exercise program.  The patient should continue antiplatelet therapy and aggressive treatment of the lipid abnormalities  No changes in the patient's  medications at this time  Given his past bypass I would continue to follow him and will see him back in 6 months with an ABI.  - VAS [place] ABI WITH/WO TBI; Future  2. Neuropathic pain See #1  3. Primary osteoarthritis involving multiple joints Continue NSAID medications as already ordered, these medications have been reviewed and there are no changes at this time.  Continued activity and therapy was stressed.   4. Atherosclerosis of native coronary artery of native heart without angina pectoris Continue cardiac and antihypertensive medications as already ordered and reviewed, no changes at this time.  Continue statin as ordered and reviewed, no changes at this time  Nitrates PRN for chest pain   5. Pure hypercholesterolemia Continue statin as ordered and reviewed, no changes at this time     Hortencia Pilar, MD  07/30/2019 9:47 AM

## 2019-08-28 ENCOUNTER — Other Ambulatory Visit: Payer: Self-pay | Admitting: Internal Medicine

## 2019-08-28 DIAGNOSIS — M545 Low back pain, unspecified: Secondary | ICD-10-CM

## 2019-09-17 ENCOUNTER — Other Ambulatory Visit: Payer: Medicare HMO

## 2019-10-01 ENCOUNTER — Other Ambulatory Visit: Payer: Self-pay | Admitting: Neurology

## 2019-10-24 ENCOUNTER — Other Ambulatory Visit: Payer: Medicare HMO

## 2019-10-27 ENCOUNTER — Other Ambulatory Visit: Payer: Self-pay

## 2019-10-27 ENCOUNTER — Ambulatory Visit
Admission: RE | Admit: 2019-10-27 | Discharge: 2019-10-27 | Disposition: A | Discharge status: Home / Self Care | Payer: Medicare HMO | Source: Ambulatory Visit | Attending: Internal Medicine | Admitting: Internal Medicine

## 2019-10-27 DIAGNOSIS — M545 Low back pain, unspecified: Secondary | ICD-10-CM

## 2019-10-27 MED ORDER — GADOBENATE DIMEGLUMINE 529 MG/ML IV SOLN
12.0000 mL | Freq: Once | INTRAVENOUS | Status: AC | PRN
  Administered 2019-10-27: 12 mL via INTRAVENOUS

## 2019-11-19 ENCOUNTER — Other Ambulatory Visit: Payer: Self-pay | Admitting: Neurology

## 2019-11-19 ENCOUNTER — Telehealth: Payer: Self-pay | Admitting: Neurology

## 2019-11-19 NOTE — Telephone Encounter (Signed)
Patient called and requested a refill on ropinirole .25 MG. He said the pharmacy told him to call since he is out of refills completely.  [address]/[place]

## 2019-11-22 ENCOUNTER — Ambulatory Visit: Payer: Medicare HMO | Admitting: Neurology

## 2019-11-25 NOTE — Progress Notes (Signed)
Cardiology Office Note   Date:  11/26/2019   ID:  [name], [name] 1951/04/30, MRN [id number]  PCP:  Rocco Serene, MD  Cardiologist:  Lubertha South  CC:Follow up   History of Present Illness: Willie Kennedy is a 68 y.o. male who presents for ongoing assessment and management of atypical chest pain, nuclear medicine study in 2011 revealed reversible inferior defect, with subsequent cardiac catheterization in October 2011 revealing 40% proximal RCA, 40% mid RCA, 50% proximal and mid circumflex, with an EF of 55% with some basal inferior hypokinesis.  Screening for AAA via duplex study was negative for aortic aneurysm on 09/2014.  Most recent echocardiogram in August 2016 revealed normal LV systolic function with an EF of 60% to 65% with grade 1 diastolic dysfunction.  Willie Kennedy has other history of hyperlipidemia, chronic dyspnea on exertion, peripheral vascular disease status post left femoropopliteal in 2005.  When last seen in the office by Dr. Stanford Breed on 09/26/2018 the patient was stable from a cardiac standpoint and without complaints of chest pain.  He comes today without cardiac complaints. He has complaints of LEE pain from the knee down. He has had ABI's completed on August 2020 which revealed normal ABI's bilaterally with no evidence of PAD.  Past Medical History:  Diagnosis Date  . Biceps tendon rupture    h/o Right biceps tendon rupture  . CAD (coronary artery disease)    a. ETT myoview (10/11) with EF 52%, reversible inferior perfusion defect, 1 mm inferolateral ST depression with exercise, 7"16" and stopped due to dyspnea and leg weakness -> LHC (10/11), EF 55%, 40% pRCA, 40% mRCA, 40% prox to mid CFX. medical treatment.  . Cancer ([place])    skin cancer behind left ear   . Dizziness    with bending over   . Hard of hearing    more per right ear  . Headache    with bright light   . History of bronchitis   . Hyperlipidemia   . Impaired glucose tolerance   . Numbness and  tingling in left arm    if holds arm upward too long pt states his primary MD is aware  . Osteoarthritis   . Peripheral vascular disease ([place]) 2005   a. s/p left fem-pop Dr.Lawson in 2005.  Marland Kitchen Shortness of breath dyspnea    walking distances or climbing stairs  . Tinnitus     Past Surgical History:  Procedure Laterality Date  . ds/p left femoral artery occlusion  2005   s/p left fem-pop bypass- Dr. Kellie Simmering  . PR VEIN BYPASS GRAFT,AORTO-FEM-POP    . s/p right bicep tendon rupture repair    . VENTRAL HERNIA REPAIR N/A 08/22/2015   Procedure: OPEN VENTRAL HERNIA REPAIR WITH MESH;  Surgeon: Jackolyn Confer, MD;  Location: [facility];  Service: General;  Laterality: N/A;     Current Outpatient Medications  Medication Sig Dispense Refill  . aspirin 81 MG tablet Take 81 mg by mouth daily.     Marland Kitchen gabapentin (NEURONTIN) 300 MG capsule Take 2 capsules in AM, 3 capsules in evening 450 capsule 3  . nitroGLYCERIN (NITROSTAT) 0.4 MG SL tablet Place 1 tablet (0.4 mg total) under the tongue every 5 (five) minutes as needed. Place 1 tablet under tongue for chest pain -- not to exceed 3 in 15 minutes 100 tablet 11  . rOPINIRole (REQUIP) 0.25 MG tablet TAKE 1 TABLET BY MOUTH 2 HOURS BEFORE BEDTIME 30 tablet 0  . rOPINIRole (REQUIP) 0.25  MG tablet TAKE 1 TABLET BY MOUTH 2 HOURS BEFORE BEDTIME 30 tablet 0  . traMADol (ULTRAM) 50 MG tablet TAKE 1 TABLET BY MOUTH EVERY NIGHT 90 tablet 3   No current facility-administered medications for this visit.    Allergies:   Patient has no known allergies.    Social History:  The patient  reports that he quit smoking about 25 years ago. His smoking use included cigarettes. He has a 280.00 pack-year smoking history. He has never used smokeless tobacco. He reports that he does not drink alcohol or use drugs.   Family History:  The patient's family history includes Cancer in his father and mother; Cervical cancer in his mother; Lung cancer in his father.    ROS: All  other systems are reviewed and negative. Unless otherwise mentioned in H&P    PHYSICAL EXAM: VS:  BP 118/72   Pulse 65   Temp 97.9 F (36.6 C)   Ht 5\' 5"  (1.651 m)   Wt 138 lb 6.4 oz (62.8 kg)   SpO2 96%   BMI 23.03 kg/m  , BMI Body mass index is 23.03 kg/m. GEN: Well nourished, well developed, in no acute distress HEENT: normal Neck: no JVD, carotid bruits, or masses Cardiac:  RRR; no murmurs, rubs, or gallops,no edema  Respiratory:  Clear to auscultation bilaterally, normal work of breathing GI: soft, nontender, nondistended, + BS MS: no deformity or atrophy Skin: warm and dry, no rash Neuro:  Strength and sensation are intact Psych: euthymic mood, full affect   EKG: Normal sinus rhythm, heart rate of 65 bpm.  Recent Labs: No results found for requested labs within last 8760 hours.    Lipid Panel    Component Value Date/Time   CHOL 124 (L) 06/27/2015 1222   TRIG 48 06/27/2015 1222   HDL 47 06/27/2015 1222   CHOLHDL 2.6 06/27/2015 1222   VLDL 10 06/27/2015 1222   LDLCALC 67 06/27/2015 1222      Wt Readings from Last 3 Encounters:  11/26/19 138 lb 6.4 oz (62.8 kg)  07/30/19 136 lb (61.7 kg)  07/09/19 138 lb (62.6 kg)      Other studies Reviewed: ABI as above.  ASSESSMENT AND PLAN:  1.  Atypical chest pain: No planned cardiac testing or medication changes at this time.  Continue secondary prevention.  2.  Coronary artery disease: Cardiac catheterization 2011 revealing 40% proximal RCA, 40% mid RCA, 50% proximal and mid circumflex with an EF of 55%.  Continue medical management.  He will need to have follow-up labs by PCP for surveillance of his lipid status.  3.  Hypercholesterolemia: He is currently not on statin therapy.  He will need to have fasting lipids and LFTs on follow-up with his primary care physician in January 2021.  With known native vessel CAD, nonobstructive in 2011, would like to keep his LDL less than 70.  At this time he is completely  asymptomatic and therefore no cardiac testing is planned.  4. PAD: S/P left fem-popliteal bypass. Follow up with Dr. [facility].   Current medicines are reviewed at length with the patient today.    Labs/ tests ordered today include: None Phill Myron. [place] Pugh, ANP, AACC   11/26/2019 4:53 PM    [facility] [address] [place] [address] Office [phone] Fax [phone]  Notice: This dictation was prepared with Dragon dictation along with smaller phrase technology. Any transcriptional errors that result from this process are unintentional and  may not be corrected upon review.

## 2019-11-26 ENCOUNTER — Other Ambulatory Visit: Payer: Self-pay

## 2019-11-26 ENCOUNTER — Encounter: Payer: Self-pay | Admitting: Adult Health

## 2019-11-26 ENCOUNTER — Ambulatory Visit (INDEPENDENT_AMBULATORY_CARE_PROVIDER_SITE_OTHER): Payer: Medicare HMO | Admitting: Adult Health

## 2019-11-26 VITALS — BP 118/72 | HR 65 | Temp 97.9°F | Ht 65.0 in | Wt 138.4 lb

## 2019-11-26 DIAGNOSIS — I739 Peripheral vascular disease, unspecified: Secondary | ICD-10-CM

## 2019-11-26 DIAGNOSIS — E7800 Pure hypercholesterolemia, unspecified: Secondary | ICD-10-CM

## 2019-11-26 DIAGNOSIS — I2510 Atherosclerotic heart disease of native coronary artery without angina pectoris: Secondary | ICD-10-CM | POA: Diagnosis not present

## 2019-11-26 NOTE — Patient Instructions (Signed)
Medication Instructions:  Continue current medications  *If you need a refill on your cardiac medications before your next appointment, please call your pharmacy*  Lab Work: None Ordered  Testing/Procedures: None Ordered  Follow-Up: At [facility], you and your health needs are our priority.  As part of our continuing mission to provide you with exceptional heart care, we have created designated Provider Care Teams.  These Care Teams include your primary Cardiologist (physician) and Advanced Practice Providers (APPs -  Physician Assistants and Nurse Practitioners) who all work together to provide you with the care you need, when you need it.  Your next appointment:   1 year(s)  The format for your next appointment:   In Person  Provider:   Kirk Ruths, MD

## 2019-11-27 NOTE — Telephone Encounter (Signed)
This was completed

## 2019-12-10 ENCOUNTER — Telehealth: Payer: Medicare HMO | Admitting: Neurology

## 2019-12-12 ENCOUNTER — Encounter: Payer: Self-pay | Admitting: Neurology

## 2019-12-13 ENCOUNTER — Other Ambulatory Visit: Payer: Self-pay

## 2019-12-13 ENCOUNTER — Telehealth (INDEPENDENT_AMBULATORY_CARE_PROVIDER_SITE_OTHER): Payer: Medicare HMO | Admitting: Neurology

## 2019-12-13 VITALS — Ht 65.0 in | Wt 138.0 lb

## 2019-12-13 DIAGNOSIS — G2581 Restless legs syndrome: Secondary | ICD-10-CM

## 2019-12-13 DIAGNOSIS — G43109 Migraine with aura, not intractable, without status migrainosus: Secondary | ICD-10-CM | POA: Diagnosis not present

## 2019-12-13 MED ORDER — ROPINIROLE HCL 0.25 MG PO TABS: ORAL_TABLET | ORAL | 3 refills | Status: DC
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
## 2020-01-28 ENCOUNTER — Other Ambulatory Visit: Payer: Self-pay

## 2020-01-28 ENCOUNTER — Encounter (INDEPENDENT_AMBULATORY_CARE_PROVIDER_SITE_OTHER): Payer: Self-pay

## 2020-01-28 ENCOUNTER — Encounter (INDEPENDENT_AMBULATORY_CARE_PROVIDER_SITE_OTHER): Payer: Self-pay | Admitting: Vascular Surgery

## 2020-01-28 ENCOUNTER — Ambulatory Visit (INDEPENDENT_AMBULATORY_CARE_PROVIDER_SITE_OTHER): Payer: Medicare HMO

## 2020-01-28 ENCOUNTER — Ambulatory Visit (INDEPENDENT_AMBULATORY_CARE_PROVIDER_SITE_OTHER): Payer: Medicare HMO | Admitting: Vascular Surgery

## 2020-01-28 VITALS — BP 132/80 | HR 66 | Resp 15 | Wt 142.0 lb

## 2020-01-28 DIAGNOSIS — I2510 Atherosclerotic heart disease of native coronary artery without angina pectoris: Secondary | ICD-10-CM | POA: Diagnosis not present

## 2020-01-28 DIAGNOSIS — M8949 Other hypertrophic osteoarthropathy, multiple sites: Secondary | ICD-10-CM

## 2020-01-28 DIAGNOSIS — I70212 Atherosclerosis of native arteries of extremities with intermittent claudication, left leg: Secondary | ICD-10-CM | POA: Diagnosis not present

## 2020-01-28 DIAGNOSIS — E7800 Pure hypercholesterolemia, unspecified: Secondary | ICD-10-CM | POA: Diagnosis not present

## 2020-01-28 DIAGNOSIS — M159 Polyosteoarthritis, unspecified: Secondary | ICD-10-CM

## 2020-01-28 NOTE — Progress Notes (Signed)
MRN : [id number]  Willie Kennedy is a 69 y.o. ([birth year]) male who presents with chief complaint of  Chief Complaint  Patient presents with  . Follow-up    26month ultrasound follow up  .  History of Present Illness:   The patient returns to the office for followup and review of the noninvasive studies. There have been no interval changes in lower extremity symptoms. No interval shortening of the patient's claudication distance or development of rest pain symptoms. No new ulcers or wounds have occurred since the last visit.  There have been no significant changes to the patient's overall health care.  The patient denies amaurosis fugax or recent TIA symptoms. There are no recent neurological changes noted. The patient denies history of DVT, PE or superficial thrombophlebitis. The patient denies recent episodes of angina or shortness of breath.   ABI Rt=0.98 and Lt=1.00  (previous ABI Rt=1.06 and Lt=1.08)  Previous duplex ultrasound of the aorta iliac system demonstrates uniform flow with triphasic signals.  The maximal diameter of the abdominal aorta is 1.95 cm.  No evidence of hemodynamically significant lesions.  Previous duplex ultrasound of the left lower extremity demonstrates a widely patent bypass graft with triphasic signals throughout.  Current Meds  Medication Sig  . aspirin 81 MG tablet Take 81 mg by mouth daily.   Marland Kitchen atorvastatin (LIPITOR) 20 MG tablet   . citalopram (CELEXA) 10 MG tablet   . gabapentin (NEURONTIN) 300 MG capsule Take 2 capsules in AM, 3 capsules in evening (Patient taking differently: Take 300 mg by mouth. Take 3 capsules in AM, 3 capsules afternoon and 3 caps in evening)  . nitroGLYCERIN (NITROSTAT) 0.4 MG SL tablet Place 1 tablet (0.4 mg total) under the tongue every 5 (five) minutes as needed. Place 1 tablet under tongue for chest pain -- not to exceed 3 in 15 minutes  . rOPINIRole (REQUIP) 0.25 MG tablet TAKE 1 TABLET BY MOUTH 2 HOURS BEFORE  BEDTIME  . traMADol (ULTRAM) 50 MG tablet TAKE 1 TABLET BY MOUTH EVERY NIGHT (Patient taking differently: Take 50 mg by mouth 3 (three) times daily. )    Past Medical History:  Diagnosis Date  . Biceps tendon rupture    h/o Right biceps tendon rupture  . CAD (coronary artery disease)    a. ETT myoview (10/11) with EF 52%, reversible inferior perfusion defect, 1 mm inferolateral ST depression with exercise, 7"16" and stopped due to dyspnea and leg weakness -> LHC (10/11), EF 55%, 40% pRCA, 40% mRCA, 40% prox to mid CFX. medical treatment.  . Cancer ([place])    skin cancer behind left ear   . Dizziness    with bending over   . Hard of hearing    more per right ear  . Headache    with bright light   . History of bronchitis   . Hyperlipidemia   . Impaired glucose tolerance   . Numbness and tingling in left arm    if holds arm upward too long pt states his primary MD is aware  . Osteoarthritis   . Peripheral vascular disease ([place]) 2005   a. s/p left fem-pop Dr.Lawson in 2005.  Marland Kitchen Shortness of breath dyspnea    walking distances or climbing stairs  . Tinnitus     Past Surgical History:  Procedure Laterality Date  . ds/p left femoral artery occlusion  2005   s/p left fem-pop bypass- Dr. Kellie Simmering  . PR VEIN BYPASS GRAFT,AORTO-FEM-POP    .  s/p right bicep tendon rupture repair    . VENTRAL HERNIA REPAIR N/A 08/22/2015   Procedure: OPEN VENTRAL HERNIA REPAIR WITH MESH;  Surgeon: Jackolyn Confer, MD;  Location: [facility];  Service: General;  Laterality: N/A;    Social History Social History   Tobacco Use  . Smoking status: Former Smoker    Packs/day: 7.00    Years: 40.00    Pack years: 280.00    Types: Cigarettes    Quit date: 11/29/1994    Years since quitting: 25.1  . Smokeless tobacco: Never Used  Substance Use Topics  . Alcohol use: No  . Drug use: No    Family History Family History  Problem Relation Age of Onset  . Cervical cancer Mother   . Cancer Mother        cervical   . Lung cancer Father   . Cancer Father        lung    No Known Allergies   REVIEW OF SYSTEMS (Negative unless checked)  Constitutional: [] Weight loss  [] Fever  [] Chills Cardiac: [] Chest pain   [] Chest pressure   [] Palpitations   [] Shortness of breath when laying flat   [] Shortness of breath with exertion. Vascular:  [] Pain in legs with walking   [x] Pain in legs at rest  [] History of DVT   [] Phlebitis   [x] Swelling in legs   [] Varicose veins   [] Non-healing ulcers Pulmonary:   [] Uses home oxygen   [] Productive cough   [] Hemoptysis   [] Wheeze  [] COPD   [] Asthma Neurologic:  [] Dizziness   [] Seizures   [] History of stroke   [] History of TIA  [] Aphasia   [] Vissual changes   [] Weakness or numbness in arm   [] Weakness or numbness in leg Musculoskeletal:   [] Joint swelling   [] Joint pain   [] Low back pain Hematologic:  [] Easy bruising  [] Easy bleeding   [] Hypercoagulable state   [] Anemic Gastrointestinal:  [] Diarrhea   [] Vomiting  [] Gastroesophageal reflux/heartburn   [] Difficulty swallowing. Genitourinary:  [] Chronic kidney disease   [] Difficult urination  [] Frequent urination   [] Blood in urine Skin:  [] Rashes   [] Ulcers  Psychological:  [] History of anxiety   []  History of major depression.  Physical Examination  Vitals:   01/28/20 0939  BP: 132/80  Pulse: 66  Resp: 15  Weight: 142 lb (64.4 kg)   Body mass index is 23.63 kg/m. Gen: WD/WN, NAD Head: [place]/AT, No temporalis wasting.  Ear/Nose/Throat: Hearing grossly intact, nares w/o erythema or drainage Eyes: PER, EOMI, sclera nonicteric.  Neck: Supple, no large masses.   Pulmonary:  Good air movement, no audible wheezing bilaterally, no use of accessory muscles.  Cardiac: RRR, no JVD Vascular: scattered varicosities present bilaterally.  Mild venous stasis changes to the legs bilaterally.  2+ soft pitting edema Vessel Right Left  Radial Palpable Palpable  PT 1+ Palpable Trace Palpable  DP 1+ Palpable Not Palpable    Gastrointestinal: Non-distended. No guarding/no peritoneal signs.  Musculoskeletal: M/S 5/5 throughout.  No deformity or atrophy.  Neurologic: CN 2-12 intact. Symmetrical.  Speech is fluent. Motor exam as listed above. Psychiatric: Judgment intact, Mood & affect appropriate for pt's clinical situation. Dermatologic: No rashes or ulcers noted.  No changes consistent with cellulitis.  CBC Lab Results  Component Value Date   WBC 5.0 08/15/2015   HGB 13.8 08/15/2015   HCT 42.1 08/15/2015   MCV 93.1 08/15/2015   PLT 219 08/15/2015    BMET    Component Value Date/Time   NA 139 08/15/2015  0910   K 4.3 08/15/2015 0910   CL 105 08/15/2015 0910   CO2 27 08/15/2015 0910   GLUCOSE 110 (H) 08/15/2015 0910   BUN 15 08/15/2015 0910   CREATININE 1.02 08/15/2015 0910   CREATININE 1.00 06/27/2015 1222   CALCIUM 8.7 (L) 08/15/2015 0910   GFRNONAA >60 08/15/2015 0910   GFRAA >60 08/15/2015 0910   CrCl cannot be calculated (Patient's most recent lab result is older than the maximum 21 days allowed.).  COAG Lab Results  Component Value Date   INR 1.03 08/15/2015   INR 0.9 ratio 09/15/2010    Radiology No results found.   Assessment/Plan 1. Atherosclerosis of native artery of left lower extremity with intermittent claudication (HCC)  Recommend:  The patient has evidence of atherosclerosis of the lower extremities with claudication.  The patient does not voice lifestyle limiting changes at this point in time.  Noninvasive studies do not suggest clinically significant change.  No invasive studies, angiography or surgery at this time The patient should continue walking and begin a more formal exercise program.  The patient should continue antiplatelet therapy and aggressive treatment of the lipid abnormalities  No changes in the patient's medications at this time  The patient should continue wearing graduated compression socks 10-15 mmHg strength to control the mild edema.   - VAS  [place] LOWER EXTREMITY ARTERIAL DUPLEX; Future - VAS [place] ABI WITH/WO TBI; Future  2. Atherosclerosis of native coronary artery of native heart without angina pectoris Continue cardiac and antihypertensive medications as already ordered and reviewed, no changes at this time.  Continue statin as ordered and reviewed, no changes at this time  Nitrates PRN for chest pain   3. Primary osteoarthritis involving multiple joints Continue NSAID medications as already ordered, these medications have been reviewed and there are no changes at this time.  Continued activity and therapy was stressed.   4. Pure hypercholesterolemia Continue statin as ordered and reviewed, no changes at this time     Hortencia Pilar, MD  01/28/2020 9:56 AM

## 2020-04-14 ENCOUNTER — Telehealth: Payer: Self-pay | Admitting: Neurology

## 2020-04-14 NOTE — Telephone Encounter (Signed)
On going pains for a while, any suggestions. States"headaches". Wants sooner appt to see you. Please advise

## 2020-04-14 NOTE — Telephone Encounter (Signed)
Has he seen his PCP to make sure ear is okay and not the cause of his pain?

## 2020-04-14 NOTE — Telephone Encounter (Signed)
Patient having a lot of pain in right side of head right above ear and right below ear and neck is stiff. Please call.

## 2020-04-14 NOTE — Telephone Encounter (Signed)
Left message to call PCP or to check ear, and to call office back.

## 2020-05-19 ENCOUNTER — Other Ambulatory Visit: Payer: Self-pay

## 2020-05-19 ENCOUNTER — Ambulatory Visit: Payer: Medicare HMO | Admitting: Neurology

## 2020-05-19 ENCOUNTER — Other Ambulatory Visit: Payer: Medicare HMO

## 2020-05-19 ENCOUNTER — Encounter: Payer: Self-pay | Admitting: Neurology

## 2020-05-19 VITALS — BP 130/76 | HR 84 | Resp 18 | Wt 134.0 lb

## 2020-05-19 DIAGNOSIS — M542 Cervicalgia: Secondary | ICD-10-CM | POA: Diagnosis not present

## 2020-05-19 DIAGNOSIS — B0221 Postherpetic geniculate ganglionitis: Secondary | ICD-10-CM

## 2020-05-19 DIAGNOSIS — R519 Headache, unspecified: Secondary | ICD-10-CM | POA: Diagnosis not present

## 2020-05-19 LAB — SEDIMENTATION RATE: Sed Rate: 8 mm/hr (ref 0–20)

## 2020-05-19 LAB — C-REACTIVE PROTEIN: CRP: <1 mg/dL (ref 0.5–20.0)

## 2020-05-19 MED ORDER — ROPINIROLE HCL 0.25 MG PO TABS: ORAL_TABLET | ORAL | 3 refills | Status: DC

## 2020-05-19 MED ORDER — NORTRIPTYLINE HCL 10 MG PO CAPS: ORAL_CAPSULE | ORAL | 11 refills | Status: DC

## 2020-05-19 MED ORDER — TIZANIDINE HCL 4 MG PO TABS: 4.0000 mg | ORAL_TABLET | Freq: Every day | ORAL | 11 refills | Status: DC
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
## 2020-05-19 NOTE — Patient Instructions (Addendum)
1. Bloodwork for ESR, CRP  2. Schedule MRI brain with and without contrast  3. Schedule soft tissue neck CT with contrast  4. Follow-up in 3 months, call for any changes

## 2020-05-20 ENCOUNTER — Telehealth: Payer: Self-pay

## 2020-05-20 NOTE — Telephone Encounter (Signed)
-----   Message from Cameron Sprang, MD sent at 05/20/2020 11:17 AM EDT ----- Pls let him know bloodwork normal, no inflammation seen. Thanks

## 2020-05-20 NOTE — Telephone Encounter (Signed)
Pt called no answer voice mail left for pt to call back 

## 2020-05-21 ENCOUNTER — Telehealth: Payer: Self-pay

## 2020-05-21 NOTE — Telephone Encounter (Signed)
-----   Message from Cameron Sprang, MD sent at 05/20/2020 11:17 AM EDT ----- Pls let him know bloodwork normal, no inflammation seen. Thanks

## 2020-05-21 NOTE — Telephone Encounter (Signed)
Spoke to pt's family member she will have pt call the office back when he returns home to go over lab work

## 2020-05-29 ENCOUNTER — Telehealth: Payer: Self-pay | Admitting: Neurology

## 2020-05-29 NOTE — Telephone Encounter (Signed)
AccessNurse /30/21 @ 5:34pm:  "Caller states he is returning a call from the office."

## 2020-05-29 NOTE — Telephone Encounter (Signed)
Returned pts call to let him know that his blood work was normal with no signs of inflammation, he verbalized understanding.

## 2020-06-04 ENCOUNTER — Ambulatory Visit
Admission: RE | Admit: 2020-06-04 | Discharge: 2020-06-04 | Disposition: A | Discharge status: Home / Self Care | Payer: Medicare HMO | Source: Ambulatory Visit | Attending: Neurology | Admitting: Neurology

## 2020-06-04 DIAGNOSIS — B0221 Postherpetic geniculate ganglionitis: Secondary | ICD-10-CM

## 2020-06-04 DIAGNOSIS — R519 Headache, unspecified: Secondary | ICD-10-CM

## 2020-06-04 DIAGNOSIS — M542 Cervicalgia: Secondary | ICD-10-CM

## 2020-06-04 MED ORDER — IOPAMIDOL (ISOVUE-300) INJECTION 61%
75.0000 mL | Freq: Once | INTRAVENOUS | Status: AC | PRN
  Administered 2020-06-04: 75 mL via INTRAVENOUS

## 2020-06-05 ENCOUNTER — Other Ambulatory Visit: Payer: Self-pay

## 2020-06-05 ENCOUNTER — Telehealth: Payer: Self-pay | Admitting: Neurology

## 2020-06-05 DIAGNOSIS — R519 Headache, unspecified: Secondary | ICD-10-CM

## 2020-06-05 DIAGNOSIS — M792 Neuralgia and neuritis, unspecified: Secondary | ICD-10-CM

## 2020-06-05 DIAGNOSIS — B0221 Postherpetic geniculate ganglionitis: Secondary | ICD-10-CM

## 2020-06-05 NOTE — Telephone Encounter (Signed)
Spoke to patient and wife re: results of CT neck showing mass in the posterolateral nasopharynx, discussed urgent referral will be sent to ENT.   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 Thanks

## 2020-06-05 NOTE — Telephone Encounter (Signed)
Referral placed urgent to [facility]NT

## 2020-06-11 ENCOUNTER — Telehealth: Payer: Self-pay

## 2020-06-11 ENCOUNTER — Telehealth: Payer: Self-pay | Admitting: Neurology

## 2020-06-11 NOTE — Telephone Encounter (Signed)
Great, thanks

## 2020-06-11 NOTE — Telephone Encounter (Signed)
Called pt to update on ENT referral to [facility]NT. He is scheduled for 07/10/2020 @ 1:30 PM with Dr Constance Holster, to arrive 15 min early for check-in, has been placed on wait list for earlier appt and has number to reschedule [phone], he verbalized understanding.

## 2020-06-11 NOTE — Telephone Encounter (Signed)
Pt called checking on status of ENT referral, has not been called to schedule appointment, having increased c/o pain. Called ENT office [phone] and they cannot see Epic orders, faxed info to [phone] and notified pt he should get a call to schedule in next few days, he verbalized understanding.

## 2020-06-11 NOTE — Telephone Encounter (Signed)
Access[facility]urse 06/11/20 @ 12:22pm:  "Caller states he has questions about his referral."

## 2020-06-18 ENCOUNTER — Ambulatory Visit: Payer: Medicare HMO | Admitting: Neurology

## 2020-06-19 ENCOUNTER — Other Ambulatory Visit: Payer: Self-pay | Admitting: Otolaryngology

## 2020-06-19 ENCOUNTER — Encounter (HOSPITAL_BASED_OUTPATIENT_CLINIC_OR_DEPARTMENT_OTHER): Payer: Self-pay | Admitting: Otolaryngology

## 2020-06-19 ENCOUNTER — Other Ambulatory Visit: Payer: Self-pay

## 2020-06-23 ENCOUNTER — Other Ambulatory Visit (HOSPITAL_COMMUNITY)
Admission: RE | Admit: 2020-06-23 | Discharge: 2020-06-23 | Disposition: A | Discharge status: Home / Self Care | Payer: Medicare HMO | Source: Ambulatory Visit | Attending: Otolaryngology | Admitting: Otolaryngology

## 2020-06-23 DIAGNOSIS — Z20822 Contact with and (suspected) exposure to covid-19: Secondary | ICD-10-CM | POA: Diagnosis not present

## 2020-06-23 DIAGNOSIS — Z01812 Encounter for preprocedural laboratory examination: Secondary | ICD-10-CM | POA: Insufficient documentation

## 2020-06-23 LAB — SARS CORONAVIRUS 2 (TAT 6-24 HRS): SARS Coronavirus 2: NEGATIVE

## 2020-06-26 ENCOUNTER — Ambulatory Visit (HOSPITAL_BASED_OUTPATIENT_CLINIC_OR_DEPARTMENT_OTHER)
Admission: RE | Admit: 2020-06-26 | Discharge: 2020-06-26 | Disposition: A | Discharge status: Home / Self Care | Payer: Medicare HMO | Attending: Otolaryngology | Admitting: Otolaryngology

## 2020-06-26 ENCOUNTER — Encounter (HOSPITAL_BASED_OUTPATIENT_CLINIC_OR_DEPARTMENT_OTHER): Payer: Self-pay | Admitting: Otolaryngology

## 2020-06-26 ENCOUNTER — Encounter (HOSPITAL_BASED_OUTPATIENT_CLINIC_OR_DEPARTMENT_OTHER)
Admission: RE | Disposition: A | Discharge status: Home / Self Care | Payer: Self-pay | Source: Home / Self Care | Attending: Otolaryngology

## 2020-06-26 ENCOUNTER — Ambulatory Visit (HOSPITAL_BASED_OUTPATIENT_CLINIC_OR_DEPARTMENT_OTHER): Payer: Medicare HMO | Admitting: Anesthesiology

## 2020-06-26 ENCOUNTER — Other Ambulatory Visit: Payer: Self-pay

## 2020-06-26 DIAGNOSIS — Z79899 Other long term (current) drug therapy: Secondary | ICD-10-CM | POA: Diagnosis not present

## 2020-06-26 DIAGNOSIS — H6521 Chronic serous otitis media, right ear: Secondary | ICD-10-CM | POA: Insufficient documentation

## 2020-06-26 DIAGNOSIS — E785 Hyperlipidemia, unspecified: Secondary | ICD-10-CM | POA: Insufficient documentation

## 2020-06-26 DIAGNOSIS — Z7982 Long term (current) use of aspirin: Secondary | ICD-10-CM | POA: Insufficient documentation

## 2020-06-26 DIAGNOSIS — Z85828 Personal history of other malignant neoplasm of skin: Secondary | ICD-10-CM | POA: Diagnosis not present

## 2020-06-26 DIAGNOSIS — I739 Peripheral vascular disease, unspecified: Secondary | ICD-10-CM | POA: Diagnosis not present

## 2020-06-26 DIAGNOSIS — J392 Other diseases of pharynx: Secondary | ICD-10-CM | POA: Diagnosis present

## 2020-06-26 DIAGNOSIS — C119 Malignant neoplasm of nasopharynx, unspecified: Secondary | ICD-10-CM | POA: Insufficient documentation

## 2020-06-26 DIAGNOSIS — M1990 Unspecified osteoarthritis, unspecified site: Secondary | ICD-10-CM | POA: Diagnosis not present

## 2020-06-26 DIAGNOSIS — Z87891 Personal history of nicotine dependence: Secondary | ICD-10-CM | POA: Insufficient documentation

## 2020-06-26 DIAGNOSIS — I2510 Atherosclerotic heart disease of native coronary artery without angina pectoris: Secondary | ICD-10-CM | POA: Insufficient documentation

## 2020-06-26 HISTORY — PX: MYRINGOTOMY WITH TUBE PLACEMENT: SHX5663

## 2020-06-26 HISTORY — PX: NASAL ENDOSCOPY: SHX6577

## 2020-06-26 SURGERY — ENDOSCOPY, NOSE
Anesthesia: General | Site: Nose | Laterality: Right

## 2020-06-26 MED ORDER — SUGAMMADEX SODIUM 200 MG/2ML IV SOLN
INTRAVENOUS | Status: DC | PRN
Start: 2020-06-26 — End: 2020-06-26
  Administered 2020-06-26: 200 mg via INTRAVENOUS

## 2020-06-26 MED ORDER — ACETAMINOPHEN 10 MG/ML IV SOLN
1000.0000 mg | Freq: Once | INTRAVENOUS | Status: DC | PRN
  Administered 2020-06-26: 1000 mg via INTRAVENOUS

## 2020-06-26 MED ORDER — FENTANYL CITRATE (PF) 100 MCG/2ML IJ SOLN
INTRAMUSCULAR | Status: AC
  Filled 2020-06-26: qty 2

## 2020-06-26 MED ORDER — OXYCODONE HCL 5 MG/5ML PO SOLN: 5.0000 mg | Freq: Once | ORAL | Status: AC | PRN

## 2020-06-26 MED ORDER — OXYCODONE HCL 5 MG PO TABS
5.0000 mg | ORAL_TABLET | Freq: Once | ORAL | Status: AC | PRN
  Administered 2020-06-26: 5 mg via ORAL

## 2020-06-26 MED ORDER — ACETAMINOPHEN 10 MG/ML IV SOLN
INTRAVENOUS | Status: AC
  Filled 2020-06-26: qty 100

## 2020-06-26 MED ORDER — LIDOCAINE 2% (20 MG/ML) 5 ML SYRINGE
INTRAMUSCULAR | Status: AC
  Filled 2020-06-26: qty 5

## 2020-06-26 MED ORDER — ONDANSETRON HCL 4 MG/2ML IJ SOLN
INTRAMUSCULAR | Status: AC
  Filled 2020-06-26: qty 2

## 2020-06-26 MED ORDER — PROPOFOL 10 MG/ML IV BOLUS
INTRAVENOUS | Status: DC | PRN
  Administered 2020-06-26: 50 mg via INTRAVENOUS
  Administered 2020-06-26: 150 mg via INTRAVENOUS
  Administered 2020-06-26: 50 mg via INTRAVENOUS

## 2020-06-26 MED ORDER — OXYCODONE HCL 5 MG PO TABS
ORAL_TABLET | ORAL | Status: AC
  Filled 2020-06-26: qty 1

## 2020-06-26 MED ORDER — DEXAMETHASONE SODIUM PHOSPHATE 4 MG/ML IJ SOLN
INTRAMUSCULAR | Status: DC | PRN
  Administered 2020-06-26: 10 mg via INTRAVENOUS

## 2020-06-26 MED ORDER — LIDOCAINE 2% (20 MG/ML) 5 ML SYRINGE
INTRAMUSCULAR | Status: DC | PRN
  Administered 2020-06-26: 60 mg via INTRAVENOUS

## 2020-06-26 MED ORDER — CIPROFLOXACIN-DEXAMETHASONE 0.3-0.1 % OT SUSP
OTIC | Status: DC | PRN
  Administered 2020-06-26: 4 [drp] via OTIC

## 2020-06-26 MED ORDER — FENTANYL CITRATE (PF) 100 MCG/2ML IJ SOLN
INTRAMUSCULAR | Status: DC | PRN
  Administered 2020-06-26 (×4): 50 ug via INTRAVENOUS

## 2020-06-26 MED ORDER — FENTANYL CITRATE (PF) 100 MCG/2ML IJ SOLN
25.0000 ug | INTRAMUSCULAR | Status: DC | PRN
  Administered 2020-06-26: 25 ug via INTRAVENOUS
  Administered 2020-06-26: 50 ug via INTRAVENOUS
  Administered 2020-06-26: 25 ug via INTRAVENOUS

## 2020-06-26 MED ORDER — ACETAMINOPHEN 500 MG PO TABS: 1000.0000 mg | ORAL_TABLET | Freq: Once | ORAL | Status: DC | PRN

## 2020-06-26 MED ORDER — ROCURONIUM BROMIDE 10 MG/ML (PF) SYRINGE
PREFILLED_SYRINGE | INTRAVENOUS | Status: AC
  Filled 2020-06-26: qty 10

## 2020-06-26 MED ORDER — LACTATED RINGERS IV SOLN: INTRAVENOUS | Status: DC

## 2020-06-26 MED ORDER — ACETAMINOPHEN 160 MG/5ML PO SOLN: 1000.0000 mg | Freq: Once | ORAL | Status: DC | PRN

## 2020-06-26 MED ORDER — ONDANSETRON HCL 4 MG/2ML IJ SOLN
INTRAMUSCULAR | Status: DC | PRN
  Administered 2020-06-26: 4 mg via INTRAVENOUS

## 2020-06-26 MED ORDER — ROCURONIUM BROMIDE 10 MG/ML (PF) SYRINGE
PREFILLED_SYRINGE | INTRAVENOUS | Status: DC | PRN
Start: 2020-06-26 — End: 2020-06-26
  Administered 2020-06-26: 40 mg via INTRAVENOUS

## 2020-06-26 MED ORDER — DEXAMETHASONE SODIUM PHOSPHATE 10 MG/ML IJ SOLN
INTRAMUSCULAR | Status: AC
  Filled 2020-06-26: qty 1

## 2020-06-26 SURGICAL SUPPLY — 40 items
ASP/CLT FLD ANG ADJ TUBE STRL (MISCELLANEOUS)
ASPIRATOR COLLECTOR MID EAR (MISCELLANEOUS) IMPLANT
BALL CTTN LRG ABS STRL LF (GAUZE/BANDAGES/DRESSINGS) ×2
BLADE MYRINGOTOMY 6 SPEAR HDL (BLADE) ×3 IMPLANT
BLADE MYRINGOTOMY 6" SPEAR HDL (BLADE) ×1
CANISTER SUCT 1200ML W/VALVE (MISCELLANEOUS) ×4 IMPLANT
CATH ROBINSON RED A/P 14FR (CATHETERS) IMPLANT
COAGULATOR SUCT 6 FR SWTCH (ELECTROSURGICAL)
COAGULATOR SUCT SWTCH 10FR 6 (ELECTROSURGICAL) IMPLANT
COTTONBALL LRG STERILE PKG (GAUZE/BANDAGES/DRESSINGS) ×4 IMPLANT
COVER MAYO STAND STRL (DRAPES) ×4 IMPLANT
COVER WAND RF STERILE (DRAPES) IMPLANT
DROPPER MEDICINE STER 1.5ML LF (MISCELLANEOUS) IMPLANT
ELECT REM PT RETURN 9FT ADLT (ELECTROSURGICAL) ×4
ELECTRODE REM PT RTRN 9FT ADLT (ELECTROSURGICAL) ×2 IMPLANT
GAUZE SPONGE 4X4 12PLY STRL LF (GAUZE/BANDAGES/DRESSINGS) ×4 IMPLANT
GLOVE BIO SURGEON STRL SZ7.5 (GLOVE) ×4 IMPLANT
GOWN STRL REUS W/ TWL LRG LVL3 (GOWN DISPOSABLE) ×4 IMPLANT
GOWN STRL REUS W/TWL LRG LVL3 (GOWN DISPOSABLE) ×8
NDL PRECISIONGLIDE 27X1.5 (NEEDLE) IMPLANT
NDL SPNL 25GX3.5 QUINCKE BL (NEEDLE) IMPLANT
NEEDLE PRECISIONGLIDE 27X1.5 (NEEDLE) IMPLANT
NEEDLE SPNL 25GX3.5 QUINCKE BL (NEEDLE) IMPLANT
NS IRRIG 1000ML POUR BTL (IV SOLUTION) ×4 IMPLANT
PACK BASIN DAY SURGERY FS (CUSTOM PROCEDURE TRAY) ×4 IMPLANT
PATTIES SURGICAL .5 X3 (DISPOSABLE) IMPLANT
PROS SHEEHY TY XOMED (OTOLOGIC RELATED)
SHEET MEDIUM DRAPE 40X70 STRL (DRAPES) ×4 IMPLANT
SLEEVE SCD COMPRESS KNEE MED (MISCELLANEOUS) ×4 IMPLANT
SOLUTION BUTLER CLEAR DIP (MISCELLANEOUS) ×4 IMPLANT
SPONGE TONSIL TAPE 1 RFD (DISPOSABLE) IMPLANT
SPONGE TONSIL TAPE 1.25 RFD (DISPOSABLE) IMPLANT
SYR BULB EAR ULCER 3OZ GRN STR (SYRINGE) IMPLANT
TOWEL GREEN STERILE FF (TOWEL DISPOSABLE) ×4 IMPLANT
TUBE CONNECTING 20'X1/4 (TUBING) ×1
TUBE CONNECTING 20X1/4 (TUBING) ×3 IMPLANT
TUBE EAR SHEEHY BUTTON 1.27 (OTOLOGIC RELATED) IMPLANT
TUBE EAR T MOD 1.32X4.8 BL (OTOLOGIC RELATED) ×1 IMPLANT
TUBE SALEM SUMP 16 FR W/ARV (TUBING) ×2 IMPLANT
TUBE T ENT MOD 1.32X4.8 BL (OTOLOGIC RELATED) ×1

## 2020-06-26 NOTE — Anesthesia Procedure Notes (Signed)
Procedure Name: Intubation Date/Time: 06/26/2020 1:24 PM Performed by: Eulas Post, Embry Huss W, CRNA Pre-anesthesia Checklist: Patient identified, Emergency Drugs available, Suction available and Patient being monitored Patient Re-evaluated:Patient Re-evaluated prior to induction Oxygen Delivery Method: Circle system utilized Preoxygenation: Pre-oxygenation with 100% oxygen Induction Type: IV induction Ventilation: Mask ventilation without difficulty Laryngoscope Size: Miller and 2 Grade View: Grade I Tube type: Oral Tube size: 7.0 mm Number of attempts: 1 Airway Equipment and Method: Stylet Placement Confirmation: ETT inserted through vocal cords under direct vision,  positive ETCO2 and breath sounds checked- equal and bilateral Secured at: 24 cm Tube secured with: Tape Dental Injury: Teeth and Oropharynx as per pre-operative assessment

## 2020-06-26 NOTE — Op Note (Signed)
Preop diagnosis: Right nasopharynx mass, right chronic serous otitis media Postop diagnosis: same Procedure: Right myringotomy with T-tube placement Nasopharyngeal biopsy Surgeon: Redmond Baseman Anesth: General Compl: None Findings: Right middle ear with serous effusion.  Right nasopharynx with large granular mass. Description:  After discussing risks, benefits, and alternatives, the patient was brought to the operative suite and placed on the operative table in the supine position.  Anesthesia was induced and the patient was intubated by the anesthesia team without difficulty.  The right ear was inspected under the operating microscope using an ear speculum.  A radial incision was made in the anterior inferior quadrant using a myringotomy knife and the middle ear was suctioned.  A modified Richards T-tube tube was placed followed by Ciprodex drops and a cotton ball.  The bed was then turned 90 degrees from Anesthesia.  A Crow-Davis retractor was placed in the mouth and opened to reveal the oropharynx.  The retractor was placed in suspension on the Mayo stand.  A red rubber catheter was passed through the left nasal passage and clamped to provide anterior traction on the soft palate.  A laryngeal mirror was inserted to view the nasopharynx.  Takahashi forceps were inserted through the right nasal passage to take multiple biopsies from the right nasopharynx that were passed to nursing for permanent pathology.  After completion, the red rubber catheter was removed and the retractor was taken out of suspension and removed from the patient's mouth.  The throat was suctioned and he was then turned back to Anesthesia for wake-up.  He was extubated and moved to the recovery room in stable condition.

## 2020-06-26 NOTE — Anesthesia Postprocedure Evaluation (Signed)
Anesthesia Post Note  Patient: Willie Kennedy  Procedure(s) Performed: NASAL ENDOSCOPY W/NASOPHARYNGEAL BIOPSY (N/A Nose) MYRINGOTOMY WITH TUBE PLACEMENT RIGHT  EAR (Right Ear)     Patient location during evaluation: PACU Anesthesia Type: General Level of consciousness: awake and alert Pain management: pain level controlled Vital Signs Assessment: post-procedure vital signs reviewed and stable Respiratory status: spontaneous breathing, nonlabored ventilation, respiratory function stable and patient connected to nasal cannula oxygen Cardiovascular status: blood pressure returned to baseline and stable Postop Assessment: no apparent nausea or vomiting Anesthetic complications: no   No complications documented.  Last Vitals:  Vitals:   06/26/20 1500 06/26/20 1515  BP: (!) 165/69 (!) 177/81  Pulse: 59 62  Resp: 13 16  Temp:  36.6 C  SpO2: 95% 98%    Last Pain:  Vitals:   06/26/20 1545  PainSc: 6                  Maika Kaczmarek

## 2020-06-26 NOTE — Anesthesia Preprocedure Evaluation (Addendum)
Anesthesia Evaluation  Patient identified by MRN, date of birth, ID band Patient awake    Reviewed: Allergy & Precautions, NPO status , Patient's Chart, lab work & pertinent test results  History of Anesthesia Complications Negative for: history of anesthetic complications  Airway Mallampati: II  TM Distance: >3 FB Neck ROM: Full    Dental  (+) Edentulous Upper, Edentulous Lower   Pulmonary shortness of breath, neg sleep apnea, neg recent URI, former smoker,    breath sounds clear to auscultation       Cardiovascular + CAD and + Peripheral Vascular Disease   Rhythm:Regular   The left ventricular ejection fraction is moderately decreased (30-44%).  Nuclear stress EF: 43%.  Horizontal ST segment depression ST segment depression of 1.5 mm was noted during stress in the II, III and aVF leads.  The study is normal.  This is a low risk study.   Low risk stress nuclear study with normal perfusion and mildly depressed global systolic function. Findings suggest nonischemic cardiomyopathy.    Neuro/Psych  Headaches, neg Seizures negative psych ROS   GI/Hepatic negative GI ROS, Neg liver ROS,   Endo/Other  negative endocrine ROS  Renal/GU negative Renal ROS     Musculoskeletal  (+) Arthritis ,   Abdominal   Peds  Hematology negative hematology ROS (+)   Anesthesia Other Findings   Reproductive/Obstetrics                            Anesthesia Physical Anesthesia Plan  ASA: III  Anesthesia Plan: General   Post-op Pain Management:    Induction: Intravenous  PONV Risk Score and Plan: 2 and Ondansetron and Dexamethasone  Airway Management Planned: Oral ETT  Additional Equipment: None  Intra-op Plan:   Post-operative Plan: Extubation in OR  Informed Consent: I have reviewed the patients History and Physical, chart, labs and discussed the procedure including the risks, benefits and  alternatives for the proposed anesthesia with the patient or authorized representative who has indicated his/her understanding and acceptance.     Dental advisory given  Plan Discussed with: CRNA and Surgeon  Anesthesia Plan Comments:         Anesthesia Quick Evaluation

## 2020-06-26 NOTE — Brief Op Note (Signed)
06/26/2020  1:37 PM  PATIENT:  Willie Kennedy  69 y.o. male  PRE-OPERATIVE DIAGNOSIS:  NASOPHARYNGEAL MASS, Right chronic serous otitis media  POST-OPERATIVE DIAGNOSIS:  same  PROCEDURE:  Procedure(s): NASAL ENDOSCOPY W/NASOPHARYNGEAL BIOPSY (N/A) MYRINGOTOMY WITH TUBE PLACEMENT RIGHT  EAR (Right)  SURGEON:  Surgeon(s) and Role:    Melida Quitter, MD - Primary  PHYSICIAN ASSISTANT:   ASSISTANTS: none   ANESTHESIA:   general  EBL: 25 cc  BLOOD ADMINISTERED:none  DRAINS: none   LOCAL MEDICATIONS USED:  NONE  SPECIMEN:  Source of Specimen:  right nasopharynx mass  DISPOSITION OF SPECIMEN:  PATHOLOGY  COUNTS:  YES  TOURNIQUET:  * No tourniquets in log *  DICTATION: .Note written in EPIC  PLAN OF CARE: Discharge to home after PACU  PATIENT DISPOSITION:  PACU - hemodynamically stable.   Delay start of Pharmacological VTE agent (>24hrs) due to surgical blood loss or risk of bleeding: no

## 2020-06-26 NOTE — Discharge Instructions (Signed)
  Post Anesthesia Home Care Instructions  Activity: Get plenty of rest for the remainder of the day. A responsible individual must stay with you for 24 hours following the procedure.  For the next 24 hours, DO NOT: -Drive a car -Paediatric nurse -Drink alcoholic beverages -Take any medication unless instructed by your physician -Make any legal decisions or sign important papers.  Meals: Start with liquid foods such as gelatin or soup. Progress to regular foods as tolerated. Avoid greasy, spicy, heavy foods. If nausea and/or vomiting occur, drink only clear liquids until the nausea and/or vomiting subsides. Call your physician if vomiting continues.  Special Instructions/Symptoms: Your throat may feel dry or sore from the anesthesia or the breathing tube placed in your throat during surgery. If this causes discomfort, gargle with warm salt water. The discomfort should disappear within 24 hours.  Next dose of Tylenol can be given at 8:30pm if needed. Oxycodone given at 3:30pm.

## 2020-06-26 NOTE — Transfer of Care (Signed)
Immediate Anesthesia Transfer of Care Note  Patient: Willie Kennedy  Procedure(s) Performed: NASAL ENDOSCOPY W/NASOPHARYNGEAL BIOPSY (N/A Nose) MYRINGOTOMY WITH TUBE PLACEMENT RIGHT  EAR (Right Ear)  Patient Location: PACU  Anesthesia Type:General  Level of Consciousness: awake  Airway & Oxygen Therapy: Patient Spontanous Breathing and Patient connected to face mask oxygen  Post-op Assessment: Report given to RN and Post -op Vital signs reviewed and stable  Post vital signs: Reviewed and stable  Last Vitals:  Vitals Value Taken Time  BP 177/87 06/26/20 1358  Temp 36 C 06/26/20 1358  Pulse 75 06/26/20 1400  Resp 12 06/26/20 1400  SpO2 100 % 06/26/20 1400  Vitals shown include unvalidated device data.  Last Pain:  Vitals:   06/26/20 1203  PainSc: 5          Complications: No complications documented.

## 2020-06-26 NOTE — H&P (Signed)
Willie Kennedy is an 69 y.o. male.   Chief Complaint: Nasopharyngeal mass and hearing loss HPI: 69 year old male with pain in the right head for over a year.  He recently had a CT scan that demonstrated a large right nasopharyngeal mass that was visualized in the office.  He also has a stopped up right ear.  He presents for biopsy and tube placement.  Past Medical History:  Diagnosis Date  . Biceps tendon rupture    h/o Right biceps tendon rupture  . CAD (coronary artery disease)    a. ETT myoview (10/11) with EF 52%, reversible inferior perfusion defect, 1 mm inferolateral ST depression with exercise, 7"16" and stopped due to dyspnea and leg weakness -> LHC (10/11), EF 55%, 40% pRCA, 40% mRCA, 40% prox to mid CFX. medical treatment.  . Cancer ([place])    skin cancer behind left ear   . Dizziness    with bending over   . Hard of hearing    more per right ear  . Headache    with bright light   . History of bronchitis   . Hyperlipidemia   . Impaired glucose tolerance   . Numbness and tingling in left arm    if holds arm upward too long pt states his primary MD is aware  . Osteoarthritis   . Peripheral vascular disease ([place]) 2005   a. s/p left fem-pop Dr.Lawson in 2005.  Marland Kitchen Shortness of breath dyspnea    walking distances or climbing stairs  . Tinnitus     Past Surgical History:  Procedure Laterality Date  . ds/p left femoral artery occlusion  2005   s/p left fem-pop bypass- Dr. Kellie Simmering  . PR VEIN BYPASS GRAFT,AORTO-FEM-POP    . s/p right bicep tendon rupture repair    . VENTRAL HERNIA REPAIR N/A 08/22/2015   Procedure: OPEN VENTRAL HERNIA REPAIR WITH MESH;  Surgeon: Jackolyn Confer, MD;  Location: [facility];  Service: General;  Laterality: N/A;    Family History  Problem Relation Age of Onset  . Cervical cancer Mother   . Cancer Mother        cervical  . Lung cancer Father   . Cancer Father        lung   Social History:  reports that he quit smoking about 25 years ago. His  smoking use included cigarettes. He has a 280.00 pack-year smoking history. He has never used smokeless tobacco. He reports that he does not drink alcohol and does not use drugs.  Allergies: No Known Allergies  Medications Prior to Admission  Medication Sig Dispense Refill  . atorvastatin (LIPITOR) 20 MG tablet     . citalopram (CELEXA) 10 MG tablet     . gabapentin (NEURONTIN) 300 MG capsule Take 2 capsules in AM, 3 capsules in evening (Patient taking differently: Take 300 mg by mouth. Take 3 capsules in AM, 3 capsules afternoon and 3 caps in evening) 450 capsule 3  . nortriptyline (PAMELOR) 10 MG capsule Take 1 capsule every night for 1 week, then increase to 2 capsules every night 60 capsule 11  . rOPINIRole (REQUIP) 0.25 MG tablet TAKE 1 TABLET BY MOUTH 2 HOURS BEFORE BEDTIME 90 tablet 3  . tiZANidine (ZANAFLEX) 4 MG tablet Take 1 tablet (4 mg total) by mouth at bedtime. 30 tablet 11  . traMADol (ULTRAM) 50 MG tablet TAKE 1 TABLET BY MOUTH EVERY NIGHT (Patient taking differently: Take 50 mg by mouth 3 (three) times daily. ) 90  tablet 3  . aspirin 81 MG tablet Take 81 mg by mouth daily.     . nitroGLYCERIN (NITROSTAT) 0.4 MG SL tablet Place 1 tablet (0.4 mg total) under the tongue every 5 (five) minutes as needed. Place 1 tablet under tongue for chest pain -- not to exceed 3 in 15 minutes (Patient not taking: Reported on 05/19/2020) 100 tablet 11    No results found for this or any previous visit (from the past 48 hour(s)). No results found.  Review of Systems  HENT: Positive for hearing loss.   All other systems reviewed and are negative.   Height 5\' 4"  (1.626 m), weight 60 kg. Physical Exam Constitutional:      Appearance: Normal appearance.  HENT:     Head: Normocephalic and atraumatic.     Right Ear: External ear normal.     Left Ear: External ear normal.     Ears:     Comments: Right middle ear effusion.    Nose: Nose normal.     Mouth/Throat:     Mouth: Mucous membranes  are moist.     Pharynx: Oropharynx is clear.  Eyes:     Extraocular Movements: Extraocular movements intact.     Conjunctiva/sclera: Conjunctivae normal.     Pupils: Pupils are equal, round, and reactive to light.  Cardiovascular:     Rate and Rhythm: Normal rate.  Pulmonary:     Effort: Pulmonary effort is normal.  Skin:    General: Skin is warm.  Neurological:     General: No focal deficit present.     Mental Status: He is alert and oriented to person, place, and time.  Psychiatric:        Mood and Affect: Mood normal.        Behavior: Behavior normal.        Thought Content: Thought content normal.        Judgment: Judgment normal.      Assessment/Plan Right nasopharyngeal cancer and right chronic serous otitis media  To OR for nasopharyngeal biopsy and right myringotomy with tube placement.  Melida Quitter, MD 06/26/2020, 12:11 PM

## 2020-06-27 ENCOUNTER — Encounter (HOSPITAL_BASED_OUTPATIENT_CLINIC_OR_DEPARTMENT_OTHER): Payer: Self-pay | Admitting: Otolaryngology

## 2020-06-27 ENCOUNTER — Other Ambulatory Visit: Payer: Self-pay | Admitting: Otolaryngology

## 2020-06-27 ENCOUNTER — Other Ambulatory Visit (HOSPITAL_COMMUNITY): Payer: Self-pay | Admitting: Otolaryngology

## 2020-06-27 DIAGNOSIS — C119 Malignant neoplasm of nasopharynx, unspecified: Secondary | ICD-10-CM

## 2020-06-27 LAB — SURGICAL PATHOLOGY

## 2020-06-30 ENCOUNTER — Telehealth: Payer: Self-pay | Admitting: Oncology

## 2020-06-30 NOTE — Telephone Encounter (Signed)
Received a new pt referral from Dr. Redmond Baseman for  Nasopharyngeal carcinoma. Willie Kennedy has been scheduled to see Dr. Alen Blew on 8/10 at 2pm. I cld and left the appt date and time on the pt's vm.

## 2020-06-30 NOTE — Telephone Encounter (Signed)
Received a call from Mr. Delman and his wife to confirm the appt date and time w/Dr. Alen Blew on 8/10 at 2pm.

## 2020-07-01 ENCOUNTER — Other Ambulatory Visit: Payer: Self-pay

## 2020-07-01 NOTE — Progress Notes (Signed)
Encounter created by error.

## 2020-07-03 NOTE — Progress Notes (Signed)
Oncology Nurse Navigator Documentation  Placed introductory call to new referral patient Willie Kennedy and wife [name].  Introduced myself as the H&N oncology nurse navigator that works with Dr. Alen Blew and Dr. Isidore Moos to whom he has been referred by Dr. Redmond Baseman. They confirmed understanding of referral.  Briefly explained my role as his navigator, provided my contact information.   Confirmed understanding of upcoming appts and [place] location, explained arrival and registration process.  I explained the purpose of a dental evaluation prior to starting RT, indicated he would be contacted by [facility] to arrange an appt.    I encouraged them to call with questions/concerns as he moves forward with appts and procedures.    they verbalized understanding of information provided, expressed appreciation for my call.   Navigator Initial Assessment . Employment Status: he is retired . Currently on FMLA / STD: n/a . Living Situation: he lives with his wife [name].  . Support System: wife, son and daughter-in-law, brother all live in town.  Marland Kitchen PCP: He is currently finding a new PCP . PCD: No . Financial Concerns: Yes, concerned about cost of treatment.  . Transportation Needs: no . Sensory Deficits: no . Language Barriers/Interpreter Needed:  no . Ambulation Needs: no . DME Used in Home: no . Psychosocial Needs:  no . Concerns/Needs Understanding Cancer:  addressed/answered by navigator to best of ability . Self-Expressed Needs: no  Harlow Asa RN, BSN, OCN Head & Neck Oncology Nurse [place] at [facility] Phone # [phone]  Fax # [phone]

## 2020-07-04 ENCOUNTER — Ambulatory Visit (HOSPITAL_COMMUNITY)
Admission: RE | Admit: 2020-07-04 | Discharge: 2020-07-04 | Disposition: A | Discharge status: Home / Self Care | Payer: Medicare HMO | Source: Ambulatory Visit | Attending: Otolaryngology | Admitting: Otolaryngology

## 2020-07-04 ENCOUNTER — Encounter (HOSPITAL_COMMUNITY): Payer: Self-pay

## 2020-07-04 DIAGNOSIS — C119 Malignant neoplasm of nasopharynx, unspecified: Secondary | ICD-10-CM | POA: Insufficient documentation

## 2020-07-07 ENCOUNTER — Ambulatory Visit (HOSPITAL_COMMUNITY)
Admission: RE | Admit: 2020-07-07 | Discharge: 2020-07-07 | Disposition: A | Discharge status: Home / Self Care | Payer: Medicare HMO | Source: Ambulatory Visit | Attending: Otolaryngology | Admitting: Otolaryngology

## 2020-07-07 ENCOUNTER — Other Ambulatory Visit: Payer: Self-pay

## 2020-07-07 DIAGNOSIS — J439 Emphysema, unspecified: Secondary | ICD-10-CM | POA: Insufficient documentation

## 2020-07-07 DIAGNOSIS — C119 Malignant neoplasm of nasopharynx, unspecified: Secondary | ICD-10-CM | POA: Diagnosis present

## 2020-07-07 DIAGNOSIS — I700 Atherosclerosis of aorta: Secondary | ICD-10-CM | POA: Insufficient documentation

## 2020-07-07 DIAGNOSIS — N200 Calculus of kidney: Secondary | ICD-10-CM | POA: Insufficient documentation

## 2020-07-07 LAB — GLUCOSE, CAPILLARY: Glucose-Capillary: 89 mg/dL (ref 70–99)

## 2020-07-07 MED ORDER — FLUDEOXYGLUCOSE F - 18 (FDG) INJECTION
6.1300 | Freq: Once | INTRAVENOUS | Status: AC | PRN
  Administered 2020-07-07: 6.13 via INTRAVENOUS

## 2020-07-08 ENCOUNTER — Encounter (HOSPITAL_COMMUNITY): Payer: Self-pay | Admitting: Dentistry

## 2020-07-08 ENCOUNTER — Inpatient Hospital Stay: Payer: Medicare HMO | Attending: Oncology | Admitting: Oncology

## 2020-07-08 ENCOUNTER — Ambulatory Visit (HOSPITAL_COMMUNITY): Payer: Medicare HMO | Admitting: Dentistry

## 2020-07-08 ENCOUNTER — Other Ambulatory Visit: Payer: Self-pay

## 2020-07-08 VITALS — BP 144/78 | HR 93 | Temp 98.4°F

## 2020-07-08 VITALS — BP 137/71 | HR 66 | Temp 96.6°F | Resp 18 | Ht 64.0 in | Wt 132.7 lb

## 2020-07-08 DIAGNOSIS — Z01818 Encounter for other preprocedural examination: Secondary | ICD-10-CM

## 2020-07-08 DIAGNOSIS — K0820 Unspecified atrophy of edentulous alveolar ridge: Secondary | ICD-10-CM

## 2020-07-08 DIAGNOSIS — C119 Malignant neoplasm of nasopharynx, unspecified: Secondary | ICD-10-CM

## 2020-07-08 DIAGNOSIS — K036 Deposits [accretions] on teeth: Secondary | ICD-10-CM

## 2020-07-08 DIAGNOSIS — K0889 Other specified disorders of teeth and supporting structures: Secondary | ICD-10-CM

## 2020-07-08 DIAGNOSIS — K045 Chronic apical periodontitis: Secondary | ICD-10-CM

## 2020-07-08 DIAGNOSIS — C7951 Secondary malignant neoplasm of bone: Secondary | ICD-10-CM | POA: Diagnosis not present

## 2020-07-08 DIAGNOSIS — M264 Malocclusion, unspecified: Secondary | ICD-10-CM

## 2020-07-08 DIAGNOSIS — K029 Dental caries, unspecified: Secondary | ICD-10-CM

## 2020-07-08 DIAGNOSIS — Z972 Presence of dental prosthetic device (complete) (partial): Secondary | ICD-10-CM

## 2020-07-08 DIAGNOSIS — K083 Retained dental root: Secondary | ICD-10-CM

## 2020-07-08 DIAGNOSIS — K06010 Localized gingival recession, unspecified: Secondary | ICD-10-CM

## 2020-07-08 DIAGNOSIS — K08409 Partial loss of teeth, unspecified cause, unspecified class: Secondary | ICD-10-CM

## 2020-07-08 DIAGNOSIS — F40232 Fear of other medical care: Secondary | ICD-10-CM

## 2020-07-08 DIAGNOSIS — I2510 Atherosclerotic heart disease of native coronary artery without angina pectoris: Secondary | ICD-10-CM | POA: Diagnosis not present

## 2020-07-08 DIAGNOSIS — K0530 Chronic periodontitis, unspecified: Secondary | ICD-10-CM

## 2020-07-08 MED ORDER — LIDOCAINE-PRILOCAINE 2.5-2.5 % EX CREA
1.0000 | TOPICAL_CREAM | CUTANEOUS | 0 refills | Status: DC | PRN
Start: 2020-07-08 — End: 2020-09-17

## 2020-07-08 MED ORDER — PROCHLORPERAZINE MALEATE 10 MG PO TABS
10.0000 mg | ORAL_TABLET | Freq: Four times a day (QID) | ORAL | 0 refills | Status: DC | PRN
Start: 2020-07-08 — End: 2020-10-19

## 2020-07-08 NOTE — Progress Notes (Signed)
Oncology Nurse Navigator Documentation  Met with patient during initial consult with Mr. Baby. He was accompanied by his sister-in-law and brother-in-law. . Further introduced myself as his/their Navigator, explained my role as a member of the Care Team. . Provided New Patient Information packet: o Contact information for physician, this navigator, other members of the Care Team o Advance Directive information ([place] blue pamphlet with LCSW insert); provided [facility] AD booklet at his request, encouraged him to contact Gwinda Maine, LCSW, to complete. o Fall Prevention Patient [place] sheet o Symptom Management Clinic information o [facility] campus map with highlight of [place] o SLP Information sheet . Provided and discussed educational handouts for PEG and PAC. Marland Kitchen Assisted with post-consult appt scheduling. . They verbalized understanding of information provided.  I encouraged them to call with questions/concerns moving forward.  Harlow Asa, RN, BSN, OCN Head & Neck Oncology Nurse [place] at [place] [phone]

## 2020-07-08 NOTE — Progress Notes (Signed)
DENTAL CONSULTATION  Date of Consultation:  07/08/2020 Patient Name:   Willie Kennedy Date of Birth:   1951/10/05 Medical Record Number: [id number]  COVID 19 SCREENING: The patient does not symptoms concerning for COVID-19 infection (Including fever, chills, cough, or new SHORTNESS OF BREATH).    VITALS: BP (!) 144/78   Pulse 93   Temp 98.4 F (36.9 C)   CHIEF COMPLAINT: Patient referred by Dr. Isidore Moos for dental consultation.  HPI: Willie Kennedy is a 69 year old male recently diagnosed with nasopharyngeal cancer.  Patient with anticipated chemoradiation therapy.  Patient is now seen as part of a medically necessary prechemoradiation therapy dental protocol examination.  The patient currently denies acute toothaches, swellings, or abscesses.  Patient has not seen a dentist in over 20 years.  Patient had multiple dental extractions followed by fabrication of upper and lower acrylic partial dentures.  The [facility] dentist that fabricated the dentures has since retired.  Patient indicates the upper and lower acrylic partial dentures are ill fitting.  Patient does have some dental phobia.  PROBLEM LIST: Patient Active Problem List   Diagnosis Date Noted  . Nasopharyngeal cancer ([place]) 07/08/2020  . Neuropathy 07/11/2019  . DJD (degenerative joint disease) 07/11/2019  . Decreased ROM of left elbow 10/03/2017  . Pain in left wrist 09/26/2017  . Biceps rupture, distal, left, initial encounter 06/27/2017  . Neuropathic pain 05/09/2017  . Complicated migraine 59/56/3875  . Restless leg syndrome 02/23/2017  . Transient alteration of awareness 08/06/2016  . Hyperreflexia 08/06/2016  . Muscle cramps 08/06/2016  . Left leg pain 02/06/2016  . Impacted ear wax 07/04/2015  . Hearing loss of both ears 07/04/2015  . Ventral hernia 05/16/2015  . Nocturnal leg cramps 05/16/2015  . Bruit 09/20/2014  . Chest pain 09/20/2014  . Aftercare following surgery of the circulatory system, [place] 08/07/2014   . Right elbow pain 01/10/2014  . Right leg pain 06/18/2013  . Preventative health care 05/04/2013  . Impaired glucose tolerance 05/04/2013  . CAD, NATIVE VESSEL 10/01/2010  . Nonspecific abnormal electrocardiogram (ECG) (EKG) 09/04/2010  . Hyperlipidemia 08/27/2010  . OSTEOARTHRITIS, HANDS, BILATERAL 08/27/2010  . Atherosclerosis of native arteries of extremity with intermittent claudication ([place]) 09/06/2008     PMH: Past Medical History:  Diagnosis Date  . Biceps tendon rupture    h/o Right biceps tendon rupture  . CAD (coronary artery disease)    a. ETT myoview (10/11) with EF 52%, reversible inferior perfusion defect, 1 mm inferolateral ST depression with exercise, 7"16" and stopped due to dyspnea and leg weakness -> LHC (10/11), EF 55%, 40% pRCA, 40% mRCA, 40% prox to mid CFX. medical treatment.  . Cancer ([place])    skin cancer behind left ear   . Dizziness    with bending over   . Hard of hearing    more per right ear  . Headache    with bright light   . History of bronchitis   . Hyperlipidemia   . Impaired glucose tolerance   . Numbness and tingling in left arm    if holds arm upward too long pt states his primary MD is aware  . Osteoarthritis   . Peripheral vascular disease ([place]) 2005   a. s/p left fem-pop Dr.Lawson in 2005.  Marland Kitchen Shortness of breath dyspnea    walking distances or climbing stairs  . Tinnitus     PSH: Past Surgical History:  Procedure Laterality Date  . ds/p left femoral artery occlusion  2005  s/p left fem-pop bypass- Dr. Kellie Simmering  . MYRINGOTOMY WITH TUBE PLACEMENT Right 06/26/2020   Procedure: MYRINGOTOMY WITH TUBE PLACEMENT RIGHT  EAR;  Surgeon: Melida Quitter, MD;  Location: [facility];  Service: ENT;  Laterality: Right;  . NASAL ENDOSCOPY N/A 06/26/2020   Procedure: NASAL ENDOSCOPY W/NASOPHARYNGEAL BIOPSY;  Surgeon: Melida Quitter, MD;  Location: [place];  Service: ENT;  Laterality: N/A;  . PR VEIN BYPASS  GRAFT,AORTO-FEM-POP    . s/p right bicep tendon rupture repair    . VENTRAL HERNIA REPAIR N/A 08/22/2015   Procedure: OPEN VENTRAL HERNIA REPAIR WITH MESH;  Surgeon: Jackolyn Confer, MD;  Location: [facility];  Service: General;  Laterality: N/A;    ALLERGIES: No Known Allergies  MEDICATIONS: Current Outpatient Medications  Medication Sig Dispense Refill  . atorvastatin (LIPITOR) 20 MG tablet     . citalopram (CELEXA) 10 MG tablet     . gabapentin (NEURONTIN) 300 MG capsule Take 2 capsules in AM, 3 capsules in evening (Patient taking differently: Take 300 mg by mouth. Take 3 capsules in AM, 3 capsules afternoon and 3 caps in evening) 450 capsule 3  . nortriptyline (PAMELOR) 10 MG capsule Take 1 capsule every night for 1 week, then increase to 2 capsules every night 60 capsule 11  . rOPINIRole (REQUIP) 0.25 MG tablet TAKE 1 TABLET BY MOUTH 2 HOURS BEFORE BEDTIME 90 tablet 3  . tiZANidine (ZANAFLEX) 4 MG tablet Take 1 tablet (4 mg total) by mouth at bedtime. 30 tablet 11  . traMADol (ULTRAM) 50 MG tablet TAKE 1 TABLET BY MOUTH EVERY NIGHT (Patient taking differently: Take 50 mg by mouth 3 (three) times daily. ) 90 tablet 3  . aspirin 81 MG tablet Take 81 mg by mouth daily.  (Patient not taking: Reported on 07/08/2020)    . nitroGLYCERIN (NITROSTAT) 0.4 MG SL tablet Place 1 tablet (0.4 mg total) under the tongue every 5 (five) minutes as needed. Place 1 tablet under tongue for chest pain -- not to exceed 3 in 15 minutes (Patient not taking: Reported on 05/19/2020) 100 tablet 11   No current facility-administered medications for this visit.    LABS: Lab Results  Component Value Date   WBC 5.0 08/15/2015   HGB 13.8 08/15/2015   HCT 42.1 08/15/2015   MCV 93.1 08/15/2015   PLT 219 08/15/2015      Component Value Date/Time   NA 139 08/15/2015 0910   K 4.3 08/15/2015 0910   CL 105 08/15/2015 0910   CO2 27 08/15/2015 0910   GLUCOSE 110 (H) 08/15/2015 0910   BUN 15 08/15/2015 0910    CREATININE 1.02 08/15/2015 0910   CREATININE 1.00 06/27/2015 1222   CALCIUM 8.7 (L) 08/15/2015 0910   GFRNONAA >60 08/15/2015 0910   GFRAA >60 08/15/2015 0910   Lab Results  Component Value Date   INR 1.03 08/15/2015   INR 0.9 ratio 09/15/2010   No results found for: PTT  SOCIAL HISTORY: Social History   Socioeconomic History  . Marital status: Married    Spouse name: Not on file  . Number of children: 1  . Years of education: Not on file  . Highest education level: Not on file  Occupational History  . Occupation: Social research officer, government  Tobacco Use  . Smoking status: Former Smoker    Packs/day: 7.00    Years: 40.00    Pack years: 280.00    Types: Cigarettes    Quit date: 11/29/1994  Years since quitting: 25.6  . Smokeless tobacco: Never Used  Vaping Use  . Vaping Use: Never used  Substance and Sexual Activity  . Alcohol use: No  . Drug use: No  . Sexual activity: Not on file  Other Topics Concern  . Not on file  Social History Narrative   Right handed      Highest level of edu- 11th grade      Lives in one story home      Social Determinants of Health   Financial Resource Strain:   . Difficulty of Paying Living Expenses:   Food Insecurity:   . Worried About Charity fundraiser in the Last Year:   . Arboriculturist in the Last Year:   Transportation Needs:   . Film/video editor (Medical):   Marland Kitchen Lack of Transportation (Non-Medical):   Physical Activity:   . Days of Exercise per Week:   . Minutes of Exercise per Session:   Stress:   . Feeling of Stress :   Social Connections:   . Frequency of Communication with Friends and Family:   . Frequency of Social Gatherings with Friends and Family:   . Attends Religious Services:   . Active Member of Clubs or Organizations:   . Attends Archivist Meetings:   Marland Kitchen Marital Status:   Intimate Partner Violence:   . Fear of Current or Ex-Partner:   . Emotionally Abused:   Marland Kitchen Physically Abused:   .  Sexually Abused:     FAMILY HISTORY: Family History  Problem Relation Age of Onset  . Cervical cancer Mother   . Cancer Mother        cervical  . Lung cancer Father   . Cancer Father        lung    REVIEW OF SYSTEMS: Reviewed with the patient as per History of present illness. Psych: Patient denies having dental phobia.  DENTAL HISTORY: CHIEF COMPLAINT: Patient referred by Dr. Isidore Moos for dental consultation.  HPI: Willie Kennedy is a 69 year old male recently diagnosed with nasopharyngeal cancer.  Patient with anticipated chemoradiation therapy.  Patient is now seen as part of a medically necessary prechemoradiation therapy dental protocol examination.  The patient currently denies acute toothaches, swellings, or abscesses.  Patient has not seen a dentist in over 20 years.  Patient had multiple dental extractions followed by fabrication of upper and lower acrylic partial dentures.  The [facility] dentist that fabricated the dentures has since retired.  Patient indicates the upper and lower acrylic partial dentures are ill fitting.  Patient does have some dental phobia.   DENTAL EXAMINATION: GENERAL: Patient is a well-developed, well-nourished male in no acute distress.  HEAD AND NECK: There is no palpable neck lymphadenopathy.  The patient denies acute TMJ symptoms.  Maximum interincisal opening is measured at 42 mm. INTRAORAL EXAM: Patient has normal saliva.  There is no evidence of oral abscess formation.  There is atrophy of the edentulous alveolar ridges. DENTITION: Patient is missing multiple teeth with the exception of tooth numbers 6, 11, 22, 27, and 31.  There are retained root segments in the area of tooth numbers 6 and 11. PERIODONTAL: The patient has chronic periodontitis with plaque and calculus accumulations, gingival recession, and tooth mobility.  There is moderate to severe bone loss noted. DENTAL CARIES/SUBOPTIMAL RESTORATIONS: Multiple dental caries are  noted. ENDODONTIC: The patient currently denies acute pulpitis symptoms.  There is periapical pathology and radiolucency associated with tooth numbers 6  and 11. CROWN AND BRIDGE: There are no crown bridge restorations. PROSTHODONTIC: Patient has ill fitting maxillary mandibular acrylic partial dentures. OCCLUSION: The patient has a poor occlusal scheme secondary to multiple missing teeth, retained root segments, and lack of replacement of missing teeth with clinically acceptable dental prostheses.  RADIOGRAPHIC INTERPRETATION: Orthopantogram was taken and supplemented with 5 periapical radiographs. There are multiple missing teeth.  There are retained root segments in the area of tooth #6 and 11.  There is periapical pathology and radiolucency associated with apices of tooth numbers 6 and 11.  Multiple dental caries are noted.  There is moderate to severe bone loss noted.  There is furcation involvement of tooth #31 noted. There is atrophy of the edentulous alveolar ridges.  ASSESSMENTS: 1.  Nasopharyngeal cancer 2.  Prechemoradiation therapy dental protocol 3.  Chronic apical periodontitis 4.  Multiple retained root segments 5.  Dental caries 6.  Chronic periodontitis with bone loss 7.  Accretions 8.  Gingival recession 9.  Tooth mobility 10.  Multiple missing teeth 11.  Atrophy of the edentulous alveolar ridges 12.  Ill fitting maxillary and mandibular acrylic partial dentures 13.  Poor occlusal scheme and malocclusion 14.  Dental phobia   PLAN/RECOMMENDATIONS: 1. I discussed the risks, benefits, and complications of various treatment options with the patient in relationship to his medical and dental conditions, anticipated chemoradiation therapy, and chemoradiation therapy side effects to include xerostomia, radiation caries, trismus, mucositis, taste changes, gum and jawbone changes, and risk for infection and osteoradionecrosis. We discussed various treatment options to include no  treatment, total and subtotal extractions with alveoloplasty, pre-prosthetic surgery as indicated, periodontal therapy, dental restorations, root canal therapy, crown and bridge therapy, implant therapy, and replacement of missing teeth as indicated.  We also discussed referral to an oral surgeon.  The patient refused referral to an oral surgeon.  The patient currently wishes to proceed with extraction of remaining teeth with alveoloplasty in the operating room at [facility]  with general anesthesia with dental medicine on 07/17/2020 at 9:30 AM.  Presurgical testing has been scheduled for 07/14/2020 at 2 PM at [facility].  The patient will then follow-up with a dentist of his choice for fabrication of upper and lower complete dentures after adequate healing and approximately 3 months after the last radiation therapy has been provided.  2. Discussion of findings with medical team and coordination of future medical and dental care as needed.  I spent in excess of  120 minutes during the conduct of this consultation and >50% of this time involved direct face-to-face encounter for counseling and/or coordination of the patient's care.    Lenn Cal, DDS

## 2020-07-08 NOTE — Progress Notes (Signed)
START ON PATHWAY REGIMEN - Head and Neck   Cisplatin 40 mg/m2 IV D1 q7 Days + RT:   A cycle is every 7 days:     Cisplatin   **Always confirm dose/schedule in your pharmacy ordering system**  Cisplatin 80 mg/m2 IV D1 + Fluorouracil 1,000 mg/m2/day CIV D1,2,3,4 q28 Days:   A cycle is every 28 days:     Cisplatin      Fluorouracil   **Always confirm dose/schedule in your pharmacy ordering system**  Patient Characteristics: Nasopharyngeal, Stage II - IVA Disease Classification: Nasopharyngeal Current Disease Status: No Distant Metastases and No Recurrent Disease AJCC T Category: T3 AJCC N Category: N0 AJCC M Category: M0 AJCC 8 Stage Grouping: III Intent of Therapy: Curative Intent, Discussed with Patient 

## 2020-07-08 NOTE — Patient Instructions (Signed)

## 2020-07-08 NOTE — Progress Notes (Signed)
Reason for the request:    Nasopharyngeal cancer  HPI: I was asked by Dr. Redmond Baseman to evaluate [name] for the evaluation of cancer of the nasopharynx.  He is a 69 year old man with history of coronary artery disease started developing ear pain and weight loss for the last few months.  He was evaluated by Dr. Redmond Baseman and a CT scan of the neck obtained on June 04, 2020 showed a right-sided nasopharyngeal mass eroding into the right occipital condyle without any adenopathy.  He has subsequently underwent nasopharyngeal biopsy in addition to right myringotomy and tube placement on 06/26/2020.  The final pathology showed invasive squamous cell carcinoma that is moderately differentiated.  PET CT scan obtained on 07/07/2020 showed uptake in the right posterior lateral nasopharynx with erosion into the right occipital condyle, no adenopathy noted.  Based on these findings, he was evaluated by Dr. Dorothyann Gibbs for dental evaluation and has an appointment with Dr. Isidore Moos next week.  Clinically, he does report a chronic ear pain and hearing loss and of lost close to 20 pounds.  He denies any chest pain, shortness of breath or difficulty breathing.  He denies any dysphagia or odontophagia.  His performance status and quality of life remains intact.  He does not report any headaches, blurry vision, syncope or seizures. Does not report any fevers, chills or sweats.  Does not report any cough, wheezing or hemoptysis.  Does not report any chest pain, palpitation, orthopnea or leg edema.  Does not report any nausea, vomiting or abdominal pain.  Does not report any constipation or diarrhea.  Does not report any skeletal complaints.    Does not report frequency, urgency or hematuria.  Does not report any skin rashes or lesions. Does not report any heat or cold intolerance.  Does not report any lymphadenopathy or petechiae.  Does not report any anxiety or depression.  Remaining review of systems is negative.    Past Medical  History:  Diagnosis Date  . Biceps tendon rupture    h/o Right biceps tendon rupture  . CAD (coronary artery disease)    a. ETT myoview (10/11) with EF 52%, reversible inferior perfusion defect, 1 mm inferolateral ST depression with exercise, 7"16" and stopped due to dyspnea and leg weakness -> LHC (10/11), EF 55%, 40% pRCA, 40% mRCA, 40% prox to mid CFX. medical treatment.  . Cancer ([place])    skin cancer behind left ear   . Dizziness    with bending over   . Hard of hearing    more per right ear  . Headache    with bright light   . History of bronchitis   . Hyperlipidemia   . Impaired glucose tolerance   . Numbness and tingling in left arm    if holds arm upward too long pt states his primary MD is aware  . Osteoarthritis   . Peripheral vascular disease ([place]) 2005   a. s/p left fem-pop Dr.Lawson in 2005.  Marland Kitchen Shortness of breath dyspnea    walking distances or climbing stairs  . Tinnitus   :  Past Surgical History:  Procedure Laterality Date  . ds/p left femoral artery occlusion  2005   s/p left fem-pop bypass- Dr. Kellie Simmering  . MYRINGOTOMY WITH TUBE PLACEMENT Right 06/26/2020   Procedure: MYRINGOTOMY WITH TUBE PLACEMENT RIGHT  EAR;  Surgeon: Melida Quitter, MD;  Location: [facility];  Service: ENT;  Laterality: Right;  . NASAL ENDOSCOPY N/A 06/26/2020   Procedure: NASAL ENDOSCOPY W/NASOPHARYNGEAL  BIOPSY;  Surgeon: Melida Quitter, MD;  Location: [facility];  Service: ENT;  Laterality: N/A;  . PR VEIN BYPASS GRAFT,AORTO-FEM-POP    . s/p right bicep tendon rupture repair    . VENTRAL HERNIA REPAIR N/A 08/22/2015   Procedure: OPEN VENTRAL HERNIA REPAIR WITH MESH;  Surgeon: Jackolyn Confer, MD;  Location: [facility];  Service: General;  Laterality: N/A;  :   Current Outpatient Medications:  .  aspirin 81 MG tablet, Take 81 mg by mouth daily.  (Patient not taking: Reported on 07/08/2020), Disp: , Rfl:  .  atorvastatin (LIPITOR) 20 MG tablet, , Disp: , Rfl:  .   citalopram (CELEXA) 10 MG tablet, , Disp: , Rfl:  .  gabapentin (NEURONTIN) 300 MG capsule, Take 2 capsules in AM, 3 capsules in evening (Patient taking differently: Take 300 mg by mouth. Take 3 capsules in AM, 3 capsules afternoon and 3 caps in evening), Disp: 450 capsule, Rfl: 3 .  nitroGLYCERIN (NITROSTAT) 0.4 MG SL tablet, Place 1 tablet (0.4 mg total) under the tongue every 5 (five) minutes as needed. Place 1 tablet under tongue for chest pain -- not to exceed 3 in 15 minutes (Patient not taking: Reported on 05/19/2020), Disp: 100 tablet, Rfl: 11 .  nortriptyline (PAMELOR) 10 MG capsule, Take 1 capsule every night for 1 week, then increase to 2 capsules every night, Disp: 60 capsule, Rfl: 11 .  rOPINIRole (REQUIP) 0.25 MG tablet, TAKE 1 TABLET BY MOUTH 2 HOURS BEFORE BEDTIME, Disp: 90 tablet, Rfl: 3 .  tiZANidine (ZANAFLEX) 4 MG tablet, Take 1 tablet (4 mg total) by mouth at bedtime., Disp: 30 tablet, Rfl: 11 .  traMADol (ULTRAM) 50 MG tablet, TAKE 1 TABLET BY MOUTH EVERY NIGHT (Patient taking differently: Take 50 mg by mouth 3 (three) times daily. ), Disp: 90 tablet, Rfl: 3:  No Known Allergies:  Family History  Problem Relation Age of Onset  . Cervical cancer Mother   . Cancer Mother        cervical  . Lung cancer Father   . Cancer Father        lung  :  Social History   Socioeconomic History  . Marital status: Married    Spouse name: Not on file  . Number of children: 1  . Years of education: Not on file  . Highest education level: Not on file  Occupational History  . Occupation: Social research officer, government  Tobacco Use  . Smoking status: Former Smoker    Packs/day: 7.00    Years: 40.00    Pack years: 280.00    Types: Cigarettes    Quit date: 11/29/1994    Years since quitting: 25.6  . Smokeless tobacco: Never Used  Vaping Use  . Vaping Use: Never used  Substance and Sexual Activity  . Alcohol use: No  . Drug use: No  . Sexual activity: Not on file  Other Topics Concern   . Not on file  Social History Narrative   Right handed      Highest level of edu- 11th grade      Lives in one story home      Social Determinants of Health   Financial Resource Strain:   . Difficulty of Paying Living Expenses:   Food Insecurity:   . Worried About Charity fundraiser in the Last Year:   . Arboriculturist in the Last Year:   Transportation Needs:   . Film/video editor (Medical):   Marland Kitchen  Lack of Transportation (Non-Medical):   Physical Activity:   . Days of Exercise per Week:   . Minutes of Exercise per Session:   Stress:   . Feeling of Stress :   Social Connections:   . Frequency of Communication with Friends and Family:   . Frequency of Social Gatherings with Friends and Family:   . Attends Religious Services:   . Active Member of Clubs or Organizations:   . Attends Archivist Meetings:   Marland Kitchen Marital Status:   Intimate Partner Violence:   . Fear of Current or Ex-Partner:   . Emotionally Abused:   Marland Kitchen Physically Abused:   . Sexually Abused:   :  Pertinent items are noted in HPI.  Exam: Blood pressure 137/71, pulse 66, temperature (!) 96.6 F (35.9 C), temperature source Tympanic, resp. rate 18, height 5\' 4"  (1.626 m), weight 132 lb 11.2 oz (60.2 kg), SpO2 97 %.  ECOG 1  General appearance: alert and cooperative appeared without distress. Head: atraumatic without any abnormalities. Eyes: conjunctivae/corneas clear. PERRL.  Sclera anicteric. Throat: lips, mucosa, and tongue normal; without oral thrush or ulcers. Resp: clear to auscultation bilaterally without rhonchi, wheezes or dullness to percussion. Cardio: regular rate and rhythm, S1, S2 normal, no murmur, click, rub or gallop GI: soft, non-tender; bowel sounds normal; no masses,  no organomegaly Skin: Hyperpigmented lesion noted on the right side of the submental jaw. Lymph nodes: Cervical, supraclavicular, and axillary nodes normal. Neurologic: Grossly normal without any motor,  sensory or deep tendon reflexes. Musculoskeletal: No joint deformity or effusion.    NM PET Image Initial (PI) Skull Base To Thigh  Result Date: 07/07/2020 CLINICAL DATA:  Initial treatment strategy for head and neck cancer. EXAM: NUCLEAR MEDICINE PET SKULL BASE TO THIGH TECHNIQUE: 6.13 mCi F-18 FDG was injected intravenously. Full-ring PET imaging was performed from the skull base to thigh after the radiotracer. CT data was obtained and used for attenuation correction and anatomic localization. Fasting blood glucose: 89 mg/dl COMPARISON:  CT neck with contrast 06/05/2019 FINDINGS: Mediastinal blood pool activity: SUV max 2.3 Liver activity: SUV max NA NECK: There is intense FDG uptake associated with right posterior nasopharyngeal mass within SUV max of 24.35. As noted on previous CT the mass involves the right occipital condyle and petrous apex. No enlarged or FDG avid cervical lymph nodes identified bilaterally. Incidental CT findings: none CHEST: No FDG avid supraclavicular, axillary, mediastinal, or hilar lymph nodes. No FDG avid pulmonary nodules. Incidental CT findings: Aortic atherosclerosis. Three vessel coronary artery atherosclerotic calcifications. Advanced changes of paraseptal and centrilobular emphysema. Calcified granuloma is noted within the medial right lower lobe. ABDOMEN/PELVIS: No abnormal hypermetabolic activity within the liver, pancreas, adrenal glands, or spleen. No hypermetabolic lymph nodes in the abdomen or pelvis. Incidental CT findings: Bilateral kidney cysts. Small stone noted within inferior pole collecting system of the left kidney, image 126/4. Aortic atherosclerosis. SKELETON: No focal hypermetabolic activity to suggest skeletal metastasis. Incidental CT findings: none IMPRESSION: 1. Intense FDG uptake is associated with the right posterolateral nasopharyngeal mass which erodes into the right occipital condyle and petrous apex. 2. No signs of FDG avid cervical nodal  metastasis or evidence of metastatic disease. 3. Left renal calculi. 4. Aortic Atherosclerosis (ICD10-I70.0) and Emphysema (ICD10-J43.9). Coronary artery calcifications. Electronically Signed   By: Kerby Moors M.D.   On: 07/07/2020 14:53    Assessment and Plan:   69 year old man with:   1.  Squamous cell carcinoma of the nasopharynx documented in July  2021.  He was found to have a hypermetabolic tumor with invasion into the occipital bone indicating stage III disease without any lymphadenopathy based on PET scan obtained on 07/07/2020.  The natural course of this disease was reviewed and treatment options were discussed.  Induction chemotherapy followed by concomitant chemoradiation versus chemoradiation along were reviewed.  The role of adjuvant chemotherapy was also discussed after conclusion of chemotherapy and radiation.  Given his overall age and comorbidities and his current stage, I am in favor of using concurrent chemotherapy with radiation as the definitive upfront modality.  The logistics of administration cisplatin chemotherapy at 30 to 40 mg per metered square weekly with radiation for a total of 6 to 7 weeks were reviewed.  Complications include nausea, vomiting, myelosuppression, neutropenia, sepsis, renal insufficiency, electrolyte imbalance as well as worsening hearing deficits.  After discussion today he is agreeable to proceed with this plan.  He will meet with Dr. Isidore Moos next week to discuss radiation planning.  The goal of treatment is curative at this time and he understands the difficulties and challenges associated with these treatments including side effects.  Once his appointments are set for radiation we will schedule chemotherapy infusion as well as chemotherapy education class.  2.  IV access: Port-A-Cath insertion was discussed today.  Risks and benefits and complications were reviewed.  These complications include bleeding, thrombosis and infection were discussed.  He  is agreeable to proceed at this time.  3.  Nutrition: I emphasized the importance of adequate nutrition throughout this process.  He would benefit from feeding tube given his risk of malnutrition.  4.  Antiemetics: Prescription for Compazine will be available to him.  5.  Renal function surveillance: We will monitor his creatinine on platinum based therapy and will obtain baseline renal function.  6.  Dental considerations: He has dental surgery scheduled in the next 2 weeks prior to proceeding with therapy.  7.  Pigmented lesion in the submental area: He does have history of squamous cell carcinoma.  I urged him to follow-up with dermatology regarding this issue.  PET scan does not show any advanced disease at this time but certainly need to be attended to at some point.  8.  Psychosocial support: He has a lot of needs including being the primary caregiver to his disabled wife as well as financial concerns.  We will arrange social work evaluation for him.  9.  Follow-up: Will be determined in the near future for the start of therapy.  80  minutes were dedicated to this visit. The time was spent on reviewing laboratory data, imaging studies, discussing treatment options, discussing complications related to therapy and answering questions regarding future plan.     Thank you for the referral.  I had the pleasure of meeting this patient today.  A copy of this consult has been forwarded to the requesting physician.

## 2020-07-10 ENCOUNTER — Telehealth (HOSPITAL_COMMUNITY): Payer: Self-pay

## 2020-07-10 ENCOUNTER — Inpatient Hospital Stay: Payer: Medicare HMO

## 2020-07-10 ENCOUNTER — Other Ambulatory Visit: Payer: Self-pay

## 2020-07-10 NOTE — Telephone Encounter (Signed)
-----   Message from Aletta Edouard, MD sent at 07/10/2020  3:21 PM EDT ----- Regarding: RE: Peg/Port OK for port and gastrostomy tube placement, back to back in double slot in IR.  GY  ----- Message ----- From: Danielle Dess Sent: 07/10/2020   2:21 PM EDT To: Ir Procedure Requests Subject: Peg/Port                                       Procedure: Peg and Port placement  Dx: nasopharyngeal cancer  Ordering: Dr. Zola Button [phone]  Imaging: NM Pet done 07/07/20 in Epic  Please review.  Thanks, Lia Foyer

## 2020-07-10 NOTE — Progress Notes (Signed)
Oncology Nurse Navigator Documentation  I met with Willie Kennedy and his sister-in-law today during his chemotherapy education appointment today. I provided a calendar of upcoming appointments. I provided instruction regarding his upcoming PEG/PAC placement on 07/24/20 arrival place and procedures. He was also advised of his pre-surgical COVID screening test on 07/21/20 at [address]. They both verbalized understanding of above items, and know to call me with any further questions or concerns.   Harlow Asa RN, BSN, OCN Head & Neck Oncology Nurse [place] at [facility] Phone # [phone]  Fax # [phone]

## 2020-07-14 ENCOUNTER — Other Ambulatory Visit: Payer: Self-pay

## 2020-07-14 ENCOUNTER — Encounter (HOSPITAL_COMMUNITY)
Admission: RE | Admit: 2020-07-14 | Discharge: 2020-07-14 | Disposition: A | Discharge status: Home / Self Care | Payer: Medicare HMO | Source: Ambulatory Visit | Attending: Dentistry | Admitting: Dentistry

## 2020-07-14 ENCOUNTER — Encounter (HOSPITAL_COMMUNITY): Payer: Self-pay

## 2020-07-14 ENCOUNTER — Other Ambulatory Visit (HOSPITAL_COMMUNITY)
Admission: RE | Admit: 2020-07-14 | Discharge: 2020-07-14 | Disposition: A | Discharge status: Home / Self Care | Payer: Medicare HMO | Source: Ambulatory Visit | Attending: Dentistry | Admitting: Dentistry

## 2020-07-14 DIAGNOSIS — I739 Peripheral vascular disease, unspecified: Secondary | ICD-10-CM | POA: Insufficient documentation

## 2020-07-14 DIAGNOSIS — C119 Malignant neoplasm of nasopharynx, unspecified: Secondary | ICD-10-CM | POA: Insufficient documentation

## 2020-07-14 DIAGNOSIS — Z20822 Contact with and (suspected) exposure to covid-19: Secondary | ICD-10-CM | POA: Insufficient documentation

## 2020-07-14 DIAGNOSIS — Z01812 Encounter for preprocedural laboratory examination: Secondary | ICD-10-CM | POA: Diagnosis present

## 2020-07-14 DIAGNOSIS — Z79899 Other long term (current) drug therapy: Secondary | ICD-10-CM | POA: Insufficient documentation

## 2020-07-14 DIAGNOSIS — I2510 Atherosclerotic heart disease of native coronary artery without angina pectoris: Secondary | ICD-10-CM | POA: Diagnosis not present

## 2020-07-14 DIAGNOSIS — Z85828 Personal history of other malignant neoplasm of skin: Secondary | ICD-10-CM | POA: Diagnosis not present

## 2020-07-14 DIAGNOSIS — K0530 Chronic periodontitis, unspecified: Secondary | ICD-10-CM | POA: Diagnosis not present

## 2020-07-14 DIAGNOSIS — Z87891 Personal history of nicotine dependence: Secondary | ICD-10-CM | POA: Diagnosis not present

## 2020-07-14 DIAGNOSIS — M1990 Unspecified osteoarthritis, unspecified site: Secondary | ICD-10-CM | POA: Diagnosis not present

## 2020-07-14 DIAGNOSIS — J439 Emphysema, unspecified: Secondary | ICD-10-CM | POA: Insufficient documentation

## 2020-07-14 DIAGNOSIS — Z01810 Encounter for preprocedural cardiovascular examination: Secondary | ICD-10-CM | POA: Diagnosis not present

## 2020-07-14 DIAGNOSIS — E785 Hyperlipidemia, unspecified: Secondary | ICD-10-CM | POA: Diagnosis not present

## 2020-07-14 HISTORY — DX: Personal history of other diseases of the digestive system: Z87.19

## 2020-07-14 LAB — BASIC METABOLIC PANEL
Anion gap: 8 (ref 5–15)
BUN: 20 mg/dL (ref 8–23)
CO2: 30 mmol/L (ref 22–32)
Calcium: 9.5 mg/dL (ref 8.9–10.3)
Chloride: 100 mmol/L (ref 98–111)
Creatinine, Ser: 0.87 mg/dL (ref 0.61–1.24)
GFR calc Af Amer: >60 mL/min (ref >60)
GFR calc non Af Amer: >60 mL/min (ref >60)
Glucose, Bld: 95 mg/dL (ref 70–99)
Potassium: 4.5 mmol/L (ref 3.5–5.1)
Sodium: 138 mmol/L (ref 135–145)

## 2020-07-14 LAB — CBC
HCT: 41.8 % (ref 39.0–52.0)
Hemoglobin: 13.5 g/dL (ref 13.0–17.0)
MCH: 31.3 pg (ref 26.0–34.0)
MCHC: 32.3 g/dL (ref 30.0–36.0)
MCV: 97 fL (ref 80.0–100.0)
Platelets: 315 10*3/uL (ref 150–400)
RBC: 4.31 MIL/uL (ref 4.22–5.81)
RDW: 14.5 % (ref 11.5–15.5)
WBC: 7.1 10*3/uL (ref 4.0–10.5)
nRBC: 0 % (ref 0.0–0.2)

## 2020-07-14 LAB — SARS CORONAVIRUS 2 (TAT 6-24 HRS): SARS Coronavirus 2: NEGATIVE

## 2020-07-14 NOTE — Progress Notes (Signed)
PCP - GOES  TO [place] BUT PCP RETIRED AND HE DOESN'T HAVE ONE  NOW.  Cardiologist - BRIAN CRENSHAW BUT HASN'T SEEN IN OVER A YEAR.    Chest x-ray - NA EKG - DONE TODAY Stress Test 10/11-  ECHO - 8/16 Cardiac Cath - NA   lood Thinner Instructions:NA Aspirin Instructions:STOP   COVID TEST- FOR TODAY   Anesthesia review: HEART HX  Patient denies shortness of breath, fever, cough and chest pain at PAT appointment   All instructions explained to the patient, with a verbal understanding of the material. Patient agrees to go over the instructions while at home for a better understanding. Patient also instructed to self quarantine after being tested for COVID-19. The opportunity to ask questions was provided.

## 2020-07-14 NOTE — Pre-Procedure Instructions (Signed)
Willie Kennedy  07/14/2020      [place], [place] - [address] [place] [address] Phone: [phone] Fax: [phone]  [facility] [place] [place] [address] Phone: [phone] Fax: [phone]    Your procedure is scheduled on 07/17/20.  Report to [facility] Admitting at 730 A.M.  Call this number if you have problems the morning of surgery:  [phone]   Remember:  Do not eat or drink after midnight.     Take these medicines the morning of surgery with A SIP OF WATER ----NEURONTIN,OXY IR,ULTRAM    Do not wear jewelry, make-up or nail polish.  Do not wear lotions, powders, or perfumes, or deodorant.  Do not shave 48 hours prior to surgery.  Men may shave face and neck.  Do not bring valuables to the hospital.  [facility] is not responsible for any belongings or valuables.  Contacts, dentures or bridgework may not be worn into surgery.  Leave your suitcase in the car.  After surgery it may be brought to your room.  For patients admitted to the hospital, discharge time will be determined by your treatment team.  Patients discharged the day of surgery will not be allowed to drive home.   Name and phone number of your driver:   Do not take any aspirin,anti-inflammatories,vitamins,or herbal supplements 5-7 days prior to surgery.[place] - Preparing for Surgery  Before surgery, you can play an important role.  Because skin is not sterile, your skin needs to be as free of germs as possible.  You can reduce the number of germs on you skin by washing with CHG (chlorahexidine gluconate) soap before surgery.  CHG is an antiseptic cleaner which kills germs and bonds with the skin to continue killing germs even after washing.  Oral Hygiene is also important in reducing the risk of infection.  Remember to brush your teeth with your  regular toothpaste the morning of surgery.  Please DO NOT use if you have an allergy to CHG or antibacterial soaps.  If your skin becomes reddened/irritated stop using the CHG and inform your nurse when you arrive at Short Stay.  Do not shave (including legs and underarms) for at least 48 hours prior to the first CHG shower.  You may shave your face.  Please follow these instructions carefully:   1.  Shower with CHG Soap the night before surgery and the morning of Surgery.  2.  If you choose to wash your hair, wash your hair first as usual with your normal shampoo.  3.  After you shampoo, rinse your hair and body thoroughly to remove the shampoo. 4.  Use CHG as you would any other liquid soap.  You can apply chg directly to the skin and wash gently with a      scrungie or washcloth.           5.  Apply the CHG Soap to your body ONLY FROM THE NECK DOWN.   Do not use on open wounds or open sores. Avoid contact with your eyes, ears, mouth and genitals (private parts).  Wash genitals (private parts) with your normal soap.  6.  Wash thoroughly, paying special attention to the area where your surgery will be performed.  7.  Thoroughly rinse your body with warm water from the neck down.  8.  DO  NOT shower/wash with your normal soap after using and rinsing off the CHG Soap.  9.  Pat yourself dry with a clean towel.            10.  Wear clean pajamas.            11.  Place clean sheets on your bed the night of your first shower and do not sleep with pets.  Day of Surgery  Do not apply any lotions/deoderants the morning of surgery.   Please wear clean clothes to the hospital/surgery center. Remember to brush your teeth with toothpaste.  Please read over the following fact sheets that you were given. Coughing and Deep Breathing

## 2020-07-14 NOTE — Progress Notes (Signed)
Head and Neck Cancer Location of Tumor / Histology:  Squamous cell carcinoma of RIGHT nasopharyngeal mass  Patient presented with symptoms of:  Pain in the right head for over a year.  He recently had a CT scan that demonstrated a large right nasopharyngeal mass that was visualized in ENT office.  Biopsies revealed:  06/26/2020 FINAL MICROSCOPIC DIAGNOSIS:  A. NASOPHARYNGEAL MASS, RIGHT, BIOPSY:  - Invasive squamous cell carcinoma.  COMMENT:  Lymphovascular or perineural invasion is not identified. The tumor is  moderately differentiated.  Nutrition Status Yes No Comments  Weight changes? [x]  []  Patient reports weight fluctuates frequently; Dr. Alen Blew notes a 20 lb weight loss  Swallowing concerns? []  [x]    PEG? []  [x]     Referrals Yes No Comments  Social Work? [x]  []  He has a lot of needs including being the primary caregiver to his disabled wife as well as financial concerns  Dentistry? [x]  []  Plan for remaining teeth to be extracted by Dr. Teena Dunk on 07/17/2020 (patient refused oral surgeon referral)  Swallowing therapy? [x]  []    Nutrition? [x]  []    Med/Onc? [x]  []  Saw Dr. Zola Button 07/08/2020   Safety Issues Yes No Comments  Prior radiation? []  [x]    Pacemaker/ICD? []  [x]    Possible current pregnancy? []  [x]    Is the patient on methotrexate? []  [x]     Tobacco/Marijuana/Snuff/ETOH use:  Reports that he quit smoking cigarettes about 25 years ago (1996). He has never used smokeless tobacco. He reports that he does not drink alcohol and does not use drugs.  Past/Anticipated interventions by otolaryngology, if any:  06/26/2020 Dr. Melida Quitter -Right myringotomy with T-tube placement -Nasopharyngeal biopsy  Past/Anticipated interventions by medical oncology, if any:  Under care of Dr. Zola Button:  07/08/2020 -Given his overall age and comorbidities and his current stage, I am in favor of using concurrent chemotherapy with radiation as the definitive upfront  modality -The logistics of administration cisplatin chemotherapy at 30 to 40 mg per metered square weekly with radiation for a total of 6 to 7 weeks were reviewed -He would benefit from feeding tube given his risk of malnutrition -He does have history of squamous cell carcinoma.  I urged him to follow-up with dermatology regarding this issue.  PET scan does not show any advanced disease at this time but certainly need to be attended to at some point.  Current Complaints / other details:  Nothing of note

## 2020-07-15 ENCOUNTER — Ambulatory Visit
Admission: RE | Admit: 2020-07-15 | Discharge: 2020-07-15 | Disposition: A | Discharge status: Home / Self Care | Payer: Medicare HMO | Source: Ambulatory Visit | Attending: Radiation Oncology | Admitting: Radiation Oncology

## 2020-07-15 ENCOUNTER — Other Ambulatory Visit: Payer: Self-pay

## 2020-07-15 ENCOUNTER — Encounter: Payer: Self-pay | Admitting: Radiation Oncology

## 2020-07-15 ENCOUNTER — Encounter: Payer: Self-pay | Admitting: Oncology

## 2020-07-15 ENCOUNTER — Inpatient Hospital Stay: Payer: Medicare HMO

## 2020-07-15 VITALS — BP 115/64 | HR 86 | Temp 98.0°F | Resp 18 | Ht 65.0 in | Wt 135.8 lb

## 2020-07-15 DIAGNOSIS — C111 Malignant neoplasm of posterior wall of nasopharynx: Secondary | ICD-10-CM

## 2020-07-15 DIAGNOSIS — I700 Atherosclerosis of aorta: Secondary | ICD-10-CM | POA: Diagnosis not present

## 2020-07-15 DIAGNOSIS — C119 Malignant neoplasm of nasopharynx, unspecified: Secondary | ICD-10-CM

## 2020-07-15 DIAGNOSIS — K449 Diaphragmatic hernia without obstruction or gangrene: Secondary | ICD-10-CM | POA: Insufficient documentation

## 2020-07-15 DIAGNOSIS — Z87891 Personal history of nicotine dependence: Secondary | ICD-10-CM | POA: Diagnosis not present

## 2020-07-15 DIAGNOSIS — M1990 Unspecified osteoarthritis, unspecified site: Secondary | ICD-10-CM | POA: Insufficient documentation

## 2020-07-15 DIAGNOSIS — Z809 Family history of malignant neoplasm, unspecified: Secondary | ICD-10-CM | POA: Diagnosis not present

## 2020-07-15 DIAGNOSIS — Z79899 Other long term (current) drug therapy: Secondary | ICD-10-CM | POA: Insufficient documentation

## 2020-07-15 DIAGNOSIS — N200 Calculus of kidney: Secondary | ICD-10-CM | POA: Diagnosis not present

## 2020-07-15 DIAGNOSIS — E785 Hyperlipidemia, unspecified: Secondary | ICD-10-CM | POA: Insufficient documentation

## 2020-07-15 DIAGNOSIS — I739 Peripheral vascular disease, unspecified: Secondary | ICD-10-CM | POA: Diagnosis not present

## 2020-07-15 DIAGNOSIS — C118 Malignant neoplasm of overlapping sites of nasopharynx: Secondary | ICD-10-CM | POA: Diagnosis not present

## 2020-07-15 DIAGNOSIS — Z85828 Personal history of other malignant neoplasm of skin: Secondary | ICD-10-CM | POA: Insufficient documentation

## 2020-07-15 DIAGNOSIS — I2510 Atherosclerotic heart disease of native coronary artery without angina pectoris: Secondary | ICD-10-CM | POA: Diagnosis not present

## 2020-07-15 NOTE — Progress Notes (Signed)
Oncology Nurse Navigator Documentation  Met with patient during initial consult with Dr. Isidore Moos.  He was accompanied by his sister-in-law [name]. . Further introduced myself as his Navigator, explained my role as a member of the Care Team.   . Provided introductory explanation of radiation treatment including SIM planning and purpose of Aquaplast head and shoulder mask, showed them example.    . Provided a tour of SIM and Tomo areas, explained treatment and arrival procedures. . I encouraged them to contact me with questions/concerns as treatments/procedures begin. . I scheduled a pre-chemotherapy/radiation audiology exam on 07/23/20 at 8:20 am. I have left a voice mail with that appointment with my direct number to call me back if needed.   They verbalized understanding of information provided.    Harlow Asa, RN, BSN, OCN Head & Neck Oncology Nurse [place] at [place] [phone]

## 2020-07-15 NOTE — Progress Notes (Signed)
Anesthesia Chart Review:  Case: [id number] Date/Time: 07/17/20 0915   Procedure: MULTIPLE EXTRACTION WITH ALVEOLOPLASTY (N/A )   Anesthesia type: General   Pre-op diagnosis: NASOPHARYNGEAL  CANCER CHRONIC PERODONTIS   Location: [place] OR ROOM 09 / [place] [facility]   Surgeons: Lenn Cal, DDS      DISCUSSION: [name] is a 69 year old male scheduled for the above procedure. S/p right myringotomy and right nasopharyngeal mass biopsy on 06/26/20 which showed invasive squamous cell carcinoma. Lymphovascular or perineural invasion not identified. 07/07/20 PET scan showed invasion into the occipital bone. He was referred for dental surgery prior to chemoradiation. Port-a-cath also recommended (has IR appointment 07/24/20). Progress note/H&P from 07/08/20 visit with Dr. Alen Blew.   Other history includes former smoker (quit 11/29/94), HLD, CAD (non-obstructive 2011), impaired glucose tolerance, exertional dyspnea, PAD (s/p left CFA-below knee popliteal bypass with left GSV graft 10/09/04), hiatal hernia, skin cancer (behind left ear), tinnitus, headaches, ventral hernia repair (08/22/15)  Preoperative EKG and labs from 07/14/20 WNL. Last cardiology visit 11/26/19 with continued medical therapy recommended. No chest pain or SOB per PAT RN interview.  07/14/2020 presurgical COVID-19 test negative.  Anesthesia team to evaluate on the day of surgery.   VS: BP (!) 153/76   Pulse 71   Temp 36.7 C (Oral)   Resp 18   Ht 5\' 5"  (1.651 m)   Wt 61.6 kg   SpO2 97%   BMI 22.60 kg/m    PROVIDERS: Rocco Serene, MD is PCP. He reported going to [facility].  Kirk Ruths, MD is cardiologist. Last visit 11/26/19 with Jory Sims, NP.  Long standing history of brief atypical chest pains. Non-obstructive CAD (40-50%) in 2011. Non-ischemic stress test in 2016. Continue medical management recommended with no planned cardiac testing or medications made at last visit. One year follow-up recommended.  Zola Button, MD is HEM-ONC - Eppie Gibson, MD is RAD-ONC - Melida Quitter, MD is ENT - Hortencia Pilar, MD is vascular surgeon. Last visit 01/28/20.  PAD stable by symptomology and duplex imaging. Ellouise Newer, MD is neurologist. Last visit 05/19/20 for follow-up possible complicated migraines. He had reported fsome right sided head and neck pain, so CT ordered showing right sided nasopharyngeal mass (that encases the right internal carotid artery as it enters the skull base), so she urgently referred him to ENT.   LABS: Labs reviewed: Acceptable for surgery. (all labs ordered are listed, but only abnormal results are displayed)  Labs Reviewed  SARS CORONAVIRUS 2 (TAT 6-24 HRS)  CBC  BASIC METABOLIC PANEL     IMAGES: PET Scan 07/07/20: IMPRESSION: 1. Intense FDG uptake is associated with the right posterolateral nasopharyngeal mass which erodes into the right occipital condyle and petrous apex. 2. No signs of FDG avid cervical nodal metastasis or evidence of metastatic disease. 3. Left renal calculi. 4. Aortic Atherosclerosis (ICD10-I70.0) and Emphysema (ICD10-J43.9). Coronary artery calcifications.  CT soft tissue neck 06/04/20: IMPRESSION: 1. Trans-spatial, centrally necrotic, right-sided mass posterolateral to the nasopharynx with erosion of the right occipital condyle and petrous apex, possibly nasopharyngeal carcinoma. The mass encases the right internal carotid artery as it enters the skull base. 2. No cervical lymphadenopathy. 3. Emphysema (ICD10-J43.9).   EKG: 07/14/20:  Normal sinus rhythm Normal ECG No significant change since last tracing Confirmed by Quay Burow [phone]) on 07/14/2020 8:57:58 PM   CV: BLE arterial Duplex/ABI 01/28/20: Summary:  - Right: Resting right ankle-brachial index is within normal range. No  evidence of significant  right lower extremity arterial disease. The right  toe-brachial index is normal.  - Left: Resting left ankle-brachial index  is within normal range. No  evidence of significant left lower extremity arterial disease. The left  toe-brachial index is normal.   [place] Abd Aorta 07/30/19: Summary:  Abdominal Aorta: No evidence of an abdominal aortic aneurysm was  visualized. The largest aortic measurement is 2.0 cm.   Echo 07/17/15: Study Conclusions  - Left ventricle: The cavity size was normal. Systolic function was  normal. The estimated ejection fraction was in the range of 60%  to 65%. Wall motion was normal; there were no regional wall  motion abnormalities. Doppler parameters are consistent with  abnormal left ventricular relaxation (grade 1 diastolic  dysfunction).   Nuclear stress test 07/11/15:  The left ventricular ejection fraction is moderately decreased (30-44%).  Nuclear stress EF: 43%.  Horizontal ST segment depression ST segment depression of 1.5 mm was noted during stress in the II, III and aVF leads.  The study is normal.  This is a low risk study. Low risk stress nuclear study with normal perfusion and mildly depressed global systolic function. Findings suggest nonischemic cardiomyopathy. [Echo ordered to verify LVEF and showed LVEF 60-65% on 07/17/15]  Cardiac cath 09/25/10 (for false positive stress test): LVEF 55%. Some basal inferior hypokinesis. Dominant RCA with 40% proximal stenosis and 40% mid stenosis. LM without significant disease. LCX with 50% stenosis in the proximal to mid CX prior to large OM. LAD with luminal irregularities.   Past Medical History:  Diagnosis Date  . Biceps tendon rupture    h/o Right biceps tendon rupture  . CAD (coronary artery disease)    a. ETT myoview (10/11) with EF 52%, reversible inferior perfusion defect, 1 mm inferolateral ST depression with exercise, 7"16" and stopped due to dyspnea and leg weakness -> LHC (10/11), EF 55%, 40% pRCA, 40% mRCA, 40% prox to mid CFX. medical treatment.  . Cancer ([place])    skin cancer behind left ear   .  Dizziness    with bending over   . Hard of hearing    more per right ear  . Headache    with bright light   . History of bronchitis   . History of hiatal hernia   . Hyperlipidemia   . Impaired glucose tolerance   . Numbness and tingling in left arm    if holds arm upward too long pt states his primary MD is aware  . Osteoarthritis   . Peripheral vascular disease ([place]) 2005   a. s/p left fem-pop Dr.Lawson in 2005.  Marland Kitchen Shortness of breath dyspnea    walking distances or climbing stairs  . Tinnitus     Past Surgical History:  Procedure Laterality Date  . ds/p left femoral artery occlusion  2005   s/p left fem-pop bypass- Dr. Kellie Simmering  . HERNIA REPAIR    . MYRINGOTOMY WITH TUBE PLACEMENT Right 06/26/2020   Procedure: MYRINGOTOMY WITH TUBE PLACEMENT RIGHT  EAR;  Surgeon: Melida Quitter, MD;  Location: [place];  Service: ENT;  Laterality: Right;  . NASAL ENDOSCOPY N/A 06/26/2020   Procedure: NASAL ENDOSCOPY W/NASOPHARYNGEAL BIOPSY;  Surgeon: Melida Quitter, MD;  Location: [place];  Service: ENT;  Laterality: N/A;  . PR VEIN BYPASS GRAFT,AORTO-FEM-POP    . s/p right bicep tendon rupture repair    . VENTRAL HERNIA REPAIR N/A 08/22/2015   Procedure: OPEN VENTRAL HERNIA REPAIR WITH MESH;  Surgeon: Jackolyn Confer, MD;  Location: [facility];  Service: General;  Laterality: N/A;    MEDICATIONS: . atorvastatin (LIPITOR) 20 MG tablet  . citalopram (CELEXA) 10 MG tablet  . gabapentin (NEURONTIN) 300 MG capsule  . lidocaine-prilocaine (EMLA) cream  . nitroGLYCERIN (NITROSTAT) 0.4 MG SL tablet  . nortriptyline (PAMELOR) 10 MG capsule  . oxyCODONE (OXY IR/ROXICODONE) 5 MG immediate release tablet  . prochlorperazine (COMPAZINE) 10 MG tablet  . rOPINIRole (REQUIP) 0.25 MG tablet  . tiZANidine (ZANAFLEX) 4 MG tablet  . traMADol (ULTRAM) 50 MG tablet   No current facility-administered medications for this encounter.    Myra Gianotti, PA-C Surgical Short  Stay/Anesthesiology [facility] Phone [phone] [facility] Phone [phone] 07/15/2020 4:00 PM

## 2020-07-15 NOTE — Progress Notes (Signed)
Met with patient at registration to introduce myself as Arboriculturist and to offer available resources.  Discussed one-time $1000 Radio broadcast assistant to assist with personal expenses while going through treatment.  Advised what is needed to apply.   Gave him my card if interested in applying and for any additional financial questions or concerns.

## 2020-07-15 NOTE — Anesthesia Preprocedure Evaluation (Addendum)
Anesthesia Evaluation  Patient identified by MRN, date of birth, ID band Patient awake    Reviewed: Allergy & Precautions, NPO status , Patient's Chart, lab work & pertinent test results  Airway Mallampati: I       Dental  (+) Poor Dentition, Missing,    Pulmonary former smoker,    Pulmonary exam normal        Cardiovascular + CAD  Normal cardiovascular exam Rhythm:Regular Rate:Normal     Neuro/Psych negative psych ROS   GI/Hepatic Neg liver ROS,   Endo/Other  negative endocrine ROS  Renal/GU negative Renal ROS  negative genitourinary   Musculoskeletal  (+) Arthritis , Osteoarthritis,    Abdominal Normal abdominal exam  (+)   Peds  Hematology negative hematology ROS (+)   Anesthesia Other Findings   Reproductive/Obstetrics                                                            Anesthesia Evaluation  Patient identified by MRN, date of birth, ID band Patient awake    Reviewed: Allergy & Precautions, NPO status , Patient's Chart, lab work & pertinent test results  History of Anesthesia Complications Negative for: history of anesthetic complications  Airway Mallampati: II  TM Distance: >3 FB Neck ROM: Full    Dental  (+) Edentulous Upper, Edentulous Lower   Pulmonary shortness of breath, neg sleep apnea, neg recent URI, former smoker,    breath sounds clear to auscultation       Cardiovascular + CAD and + Peripheral Vascular Disease   Rhythm:Regular   The left ventricular ejection fraction is moderately decreased (30-44%).  Nuclear stress EF: 43%.  Horizontal ST segment depression ST segment depression of 1.5 mm was noted during stress in the II, III and aVF leads.  The study is normal.  This is a low risk study.   Low risk stress nuclear study with normal perfusion and mildly depressed global systolic function. Findings suggest nonischemic  cardiomyopathy.    Neuro/Psych  Headaches, neg Seizures negative psych ROS   GI/Hepatic negative GI ROS, Neg liver ROS,   Endo/Other  negative endocrine ROS  Renal/GU negative Renal ROS     Musculoskeletal  (+) Arthritis ,   Abdominal   Peds  Hematology negative hematology ROS (+)   Anesthesia Other Findings   Reproductive/Obstetrics                            Anesthesia Physical Anesthesia Plan  ASA: III  Anesthesia Plan: General   Post-op Pain Management:    Induction: Intravenous  PONV Risk Score and Plan: 2 and Ondansetron and Dexamethasone  Airway Management Planned: Oral ETT  Additional Equipment: None  Intra-op Plan:   Post-operative Plan: Extubation in OR  Informed Consent: I have reviewed the patients History and Physical, chart, labs and discussed the procedure including the risks, benefits and alternatives for the proposed anesthesia with the patient or authorized representative who has indicated his/her understanding and acceptance.     Dental advisory given  Plan Discussed with: CRNA and Surgeon  Anesthesia Plan Comments:         Anesthesia Quick Evaluation  Anesthesia Physical Anesthesia Plan  ASA: III  Anesthesia Plan: General   Post-op Pain Management:  Induction: Intravenous  PONV Risk Score and Plan: 3  Airway Management Planned: Oral ETT  Additional Equipment: None  Intra-op Plan:   Post-operative Plan: Extubation in OR  Informed Consent: I have reviewed the patients History and Physical, chart, labs and discussed the procedure including the risks, benefits and alternatives for the proposed anesthesia with the patient or authorized representative who has indicated his/her understanding and acceptance.     Dental advisory given  Plan Discussed with:   Anesthesia Plan Comments: (PAT note written 07/15/2020 by Myra Gianotti, PA-C. )       Anesthesia Quick Evaluation

## 2020-07-16 ENCOUNTER — Encounter: Payer: Self-pay | Admitting: General Practice

## 2020-07-16 NOTE — Progress Notes (Signed)
[place] Psychosocial Distress Screening Clinical Social Work  Clinical Social Work was referred by distress screening protocol.  The patient scored a 8 on the Psychosocial Distress Thermometer which indicates moderate distress. Clinical Social Worker contacted patient by phone to assess for distress and other psychosocial needs. Unable to reach patient, left VM w information about [place] and encouragement to call back.  ONCBCN DISTRESS SCREENING 07/15/2020  Screening Type Initial Screening  Distress experienced in past week (1-10) 8  Family Problem type Partner  Emotional problem type Nervousness/Anxiety;Adjusting to illness  Physical Problem type Pain;Sleep/insomnia;Loss of appetitie;Skin dry/itchy;Swollen arms/legs  Physician notified of physical symptoms Yes  Referral to clinical psychology No  Referral to clinical social work Yes  Referral to dietition Yes  Referral to financial advocate Yes  Referral to support programs Yes  Referral to palliative care No     Clinical Social Worker follow up needed: Yes.  Unable to reach, will call again.  If yes, follow up plan:  Beverely Pace, [place], LCSW Clinical Social Worker Phone:  [phone]

## 2020-07-16 NOTE — Progress Notes (Signed)
Radiation Oncology         [phone] ________________________________  Initial outpatient Consultation  Name: Willie Kennedy MRN: [id number]  Date: 07/15/2020  DOB: Apr 18, 1951  FM:BWGY, [name], MD  Melida Quitter, MD   REFERRING PHYSICIAN: Melida Quitter, MD  DIAGNOSIS:  C11.1   ICD-10-CM   1. Nasopharyngeal cancer [facility])  C11.9 Ambulatory referral to Social Work  2. 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 male who presented with pain in the right head for over a year.  He recently had a CT scan that demonstrated a large right nasopharyngeal mass that was visualized in ENT office.  Biopsies revealed:  06/26/2020 FINAL MICROSCOPIC DIAGNOSIS:  A. NASOPHARYNGEAL MASS, RIGHT, BIOPSY:  - Invasive squamous cell carcinoma.  COMMENT:  Lymphovascular or perineural invasion is not identified. The tumor is  moderately differentiated.  Nutrition Status Yes No Comments  Weight changes? [x]  []  Patient reports weight fluctuates frequently; Dr. Alen Blew notes a 20 lb weight loss  Swallowing concerns? []  [x]    PEG? []  [x]     Referrals Yes No Comments  Social Work? [x]  []  He has a lot of needs including being the primary caregiver to his disabled wife as well as financial concerns  Dentistry? [x]  []  Plan for remaining teeth to be extracted by Dr. Teena Dunk on 07/17/2020 (patient refused oral surgeon referral)  Swallowing therapy? [x]  []    Nutrition? [x]  []    Med/Onc? [x]  []  Saw Dr. Zola Button 07/08/2020   Safety Issues Yes No Comments  Prior radiation? []  [x]    Pacemaker/ICD? []  [x]    Possible current pregnancy? []   [x]    Is the patient on methotrexate? []  [x]     Tobacco/Marijuana/Snuff/ETOH use:  Reports that he quit smoking cigarettes about 25 years ago (1996). He has never used smokeless tobacco. He reports that he does not drink alcohol and does not use drugs.  Past/Anticipated interventions by otolaryngology, if any:  06/26/2020 Dr. Melida Quitter -Right myringotomy with T-tube placement -Nasopharyngeal biopsy  PET scan was performed on 07/07/2020.  This shows SUV uptake in the right posterior lateral nasopharynx with associated mass which also has erosion into the right occipital condyle and petrous apex.  Fortunately there is no associated cervical adenopathy or metastatic disease.  I have personally reviewed his imaging  Past/Anticipated interventions by medical oncology, if any:  Under care of Dr. Zola Button:  07/08/2020 -Given his overall age and comorbidities and his current stage, I am in favor of using concurrent chemotherapy with radiation as the definitive upfront modality -The logistics of administration cisplatin chemotherapy at 30 to 40 mg per metered square weekly with radiation for a total of 6 to 7 weeks were reviewed -He would benefit from feeding tube given his risk of malnutrition -He does have history of squamous cell carcinoma.  I urged him to follow-up with dermatology regarding this issue.  PET scan does not show any advanced disease at this time but certainly need to be attended to at some point.   PREVIOUS RADIATION THERAPY: No  PAST MEDICAL HISTORY:  has a past medical history of Biceps tendon rupture, CAD (coronary artery disease), Cancer (  [place]), Dizziness, Hard of hearing, Headache, History of bronchitis, History of hiatal hernia, Hyperlipidemia, Impaired glucose tolerance, Numbness and tingling in left arm, Osteoarthritis, Peripheral vascular disease ([place]) (2005), Shortness of breath dyspnea, and Tinnitus.    PAST SURGICAL HISTORY: Past Surgical History:  Procedure  Laterality Date  . ds/p left femoral artery occlusion  2005   s/p left fem-pop bypass- Dr. Kellie Simmering  . HERNIA REPAIR    . MYRINGOTOMY WITH TUBE PLACEMENT Right 06/26/2020   Procedure: MYRINGOTOMY WITH TUBE PLACEMENT RIGHT  EAR;  Surgeon: Melida Quitter, MD;  Location: [place];  Service: ENT;  Laterality: Right;  . NASAL ENDOSCOPY N/A 06/26/2020   Procedure: NASAL ENDOSCOPY W/NASOPHARYNGEAL BIOPSY;  Surgeon: Melida Quitter, MD;  Location: [place];  Service: ENT;  Laterality: N/A;  . PR VEIN BYPASS GRAFT,AORTO-FEM-POP    . s/p right bicep tendon rupture repair    . VENTRAL HERNIA REPAIR N/A 08/22/2015   Procedure: OPEN VENTRAL HERNIA REPAIR WITH MESH;  Surgeon: Jackolyn Confer, MD;  Location: [facility];  Service: General;  Laterality: N/A;    FAMILY HISTORY: family history includes Cancer in his father and mother; Cervical cancer in his mother; Lung cancer in his father.  SOCIAL HISTORY:  reports that he quit smoking about 25 years ago. His smoking use included cigarettes. He has a 280.00 pack-year smoking history. He has never used smokeless tobacco. He reports that he does not drink alcohol and does not use drugs.  ALLERGIES: Patient has no known allergies.  MEDICATIONS:  Current Outpatient Medications  Medication Sig Dispense Refill  . atorvastatin (LIPITOR) 20 MG tablet Take 20 mg by mouth daily.     . citalopram (CELEXA) 10 MG tablet     . gabapentin (NEURONTIN) 300 MG capsule Take 2 capsules in AM, 3 capsules in evening (Patient taking differently: Take 900 mg by mouth 3 (three) times daily. ) 450 capsule 3  . nortriptyline (PAMELOR) 10 MG capsule Take 1 capsule every night for 1 week, then increase to 2 capsules every night (Patient taking differently: Take 20 mg by mouth at bedtime. ) 60 capsule 11  . oxyCODONE (OXY IR/ROXICODONE) 5 MG immediate release tablet Take 5 mg by mouth at bedtime as needed for pain.    Marland Kitchen rOPINIRole (REQUIP) 0.25 MG tablet TAKE 1  TABLET BY MOUTH 2 HOURS BEFORE BEDTIME (Patient taking differently: Take 0.25 mg by mouth at bedtime. ) 90 tablet 3  . tiZANidine (ZANAFLEX) 4 MG tablet Take 1 tablet (4 mg total) by mouth at bedtime. 30 tablet 11  . traMADol (ULTRAM) 50 MG tablet TAKE 1 TABLET BY MOUTH EVERY NIGHT (Patient taking differently: Take 50 mg by mouth 3 (three) times daily. ) 90 tablet 3  . lidocaine-prilocaine (EMLA) cream Apply 1 application topically as needed. (Patient not taking: Reported on 07/15/2020) 30 g 0  . nitroGLYCERIN (NITROSTAT) 0.4 MG SL tablet Place 1 tablet (0.4 mg total) under the tongue every 5 (five) minutes as needed. Place 1 tablet under tongue for chest pain -- not to exceed 3 in 15 minutes (Patient not taking: Reported on 07/15/2020) 100 tablet 11  . prochlorperazine (COMPAZINE) 10 MG tablet Take 1 tablet (10 mg total) by mouth every 6 (six) hours as needed for nausea or vomiting. (Patient not taking: Reported on 07/15/2020) 30 tablet 0   No current facility-administered medications for this encounter.    REVIEW OF SYSTEMS:  Notable for that above.   PHYSICAL EXAM:  height is 5'  5" (1.651 m) and weight is 135 lb 12.8 oz (61.6 kg). His temperature is 98 F (36.7 C). His blood pressure is 115/64 and his pulse is 86. His respiration is 18 and oxygen saturation is 95%.   General: Alert and oriented, in no acute distress HEENT: Head is normocephalic. Extraocular movements are intact. Oropharynx is notable for no lesions. Neck: Neck is notable for no masses Abdomen: Soft, nontender, nondistended, with no rigidity or guarding. Extremities: No cyanosis or edema. Lymphatics: see Neck Exam Skin: No concerning lesions over neck Musculoskeletal: Independently ambulatory Neurologic: Cranial nerves II through XII are grossly intact.  No ptosis, no facial droop, extraocular movements are intact.  Tongue is midline.  No obvious focalities. Speech is fluent. Coordination is intact. Psychiatric: Judgment and  insight are intact. Affect is appropriate.  ECOG = 1  0 - Asymptomatic (Fully active, able to carry on all predisease activities without restriction)  1 - Symptomatic but completely ambulatory (Restricted in physically strenuous activity but ambulatory and able to carry out work of a light or sedentary nature. For example, light housework, office work)  2 - Symptomatic, <50% in bed during the day (Ambulatory and capable of all self care but unable to carry out any work activities. Up and about more than 50% of waking hours)  3 - Symptomatic, >50% in bed, but not bedbound (Capable of only limited self-care, confined to bed or chair 50% or more of waking hours)  4 - Bedbound (Completely disabled. Cannot carry on any self-care. Totally confined to bed or chair)  5 - Death   Eustace Pen MM, Creech RH, Tormey DC, et al. [phone]). "Toxicity and response criteria of the [facility]". [place] Oncol. 5 (6): [phone]   LABORATORY DATA:  Lab Results  Component Value Date   WBC 7.1 07/14/2020   HGB 13.5 07/14/2020   HCT 41.8 07/14/2020   MCV 97.0 07/14/2020   PLT 315 07/14/2020   CMP     Component Value Date/Time   NA 138 07/14/2020 1428   K 4.5 07/14/2020 1428   CL 100 07/14/2020 1428   CO2 30 07/14/2020 1428   GLUCOSE 95 07/14/2020 1428   BUN 20 07/14/2020 1428   CREATININE 0.87 07/14/2020 1428   CREATININE 1.00 06/27/2015 1222   CALCIUM 9.5 07/14/2020 1428   PROT 6.6 08/15/2015 0910   ALBUMIN 4.0 08/15/2015 0910   AST 24 08/15/2015 0910   ALT 16 (L) 08/15/2015 0910   ALKPHOS 74 08/15/2015 0910   BILITOT 0.8 08/15/2015 0910   GFRNONAA >60 07/14/2020 1428   GFRAA >60 07/14/2020 1428      Lab Results  Component Value Date   TSH 2.447 06/27/2015     RADIOGRAPHY: NM PET Image Initial (PI) Skull Base To Thigh  Result Date: 07/07/2020 CLINICAL DATA:  Initial treatment strategy for head and neck cancer. EXAM: NUCLEAR MEDICINE PET SKULL BASE TO THIGH TECHNIQUE:  6.13 mCi F-18 FDG was injected intravenously. Full-ring PET imaging was performed from the skull base to thigh after the radiotracer. CT data was obtained and used for attenuation correction and anatomic localization. Fasting blood glucose: 89 mg/dl COMPARISON:  CT neck with contrast 06/05/2019 FINDINGS: Mediastinal blood pool activity: SUV max 2.3 Liver activity: SUV max NA NECK: There is intense FDG uptake associated with right posterior nasopharyngeal mass within SUV max of 24.35. As noted on previous CT the mass involves the right occipital condyle and petrous apex. No enlarged or FDG avid cervical lymph  nodes identified bilaterally. Incidental CT findings: none CHEST: No FDG avid supraclavicular, axillary, mediastinal, or hilar lymph nodes. No FDG avid pulmonary nodules. Incidental CT findings: Aortic atherosclerosis. Three vessel coronary artery atherosclerotic calcifications. Advanced changes of paraseptal and centrilobular emphysema. Calcified granuloma is noted within the medial right lower lobe. ABDOMEN/PELVIS: No abnormal hypermetabolic activity within the liver, pancreas, adrenal glands, or spleen. No hypermetabolic lymph nodes in the abdomen or pelvis. Incidental CT findings: Bilateral kidney cysts. Small stone noted within inferior pole collecting system of the left kidney, image 126/4. Aortic atherosclerosis. SKELETON: No focal hypermetabolic activity to suggest skeletal metastasis. Incidental CT findings: none IMPRESSION: 1. Intense FDG uptake is associated with the right posterolateral nasopharyngeal mass which erodes into the right occipital condyle and petrous apex. 2. No signs of FDG avid cervical nodal metastasis or evidence of metastatic disease. 3. Left renal calculi. 4. Aortic Atherosclerosis (ICD10-I70.0) and Emphysema (ICD10-J43.9). Coronary artery calcifications. Electronically Signed   By: Kerby Moors M.D.   On: 07/07/2020 14:53      IMPRESSION/PLAN:  This is a delightful patient  with nasopharyngeal head and neck cancer. I recommend 7 weeks of curative radiotherapy for this patient.  Anticipate concurrent chemotherapy.  Patient will be presented at tumor board this week.  We discussed the potential risks, benefits, and side effects of radiotherapy. We talked in detail about acute and late effects. We discussed that some of the most bothersome acute effects may be mucositis, dysgeusia, salivary changes, skin irritation, hair loss, dehydration, weight loss and fatigue. We talked about late effects which include but are not necessarily limited to dysphagia, hypothyroidism, vascular injury, brain injury, nerve injury, spinal cord injury, xerostomia, trismus, and neck edema. No guarantees of treatment were given. A consent form was signed and placed in the patient's medical record. The patient is enthusiastic about proceeding with treatment. I look forward to participating in the patient's care.    Simulation (treatment planning) will take place after dental extractions, likely within the next week  We also discussed that the treatment of head and neck cancer is a multidisciplinary process to maximize treatment outcomes and quality of life. For this reason the following referrals have been or will be made:   Medical oncology to discuss chemotherapy    Dentistry for dental evaluation, possible extractions in the radiation fields, and /or advice on reducing risk of cavities, osteoradionecrosis, or other oral issues.   Nutritionist for nutrition support during and after treatment.   Speech language pathology for swallowing and/or speech therapy.   Social work for social support.    Audiology exam.   Physical therapy due to risk of lymphedema in neck and deconditioning.   Baseline labs including TSH.  On date of service, in total, I spent 60 minutes on this encounter. Patient was seen in person.  __________________________________________   Eppie Gibson, MD

## 2020-07-17 ENCOUNTER — Encounter (HOSPITAL_COMMUNITY): Payer: Self-pay | Admitting: Dentistry

## 2020-07-17 ENCOUNTER — Encounter (HOSPITAL_COMMUNITY)
Admission: RE | Disposition: A | Discharge status: Home / Self Care | Payer: Self-pay | Source: Home / Self Care | Attending: Dentistry

## 2020-07-17 ENCOUNTER — Ambulatory Visit (HOSPITAL_COMMUNITY): Payer: Medicare HMO | Admitting: Certified Registered Nurse Anesthetist

## 2020-07-17 ENCOUNTER — Ambulatory Visit (HOSPITAL_COMMUNITY)
Admission: RE | Admit: 2020-07-17 | Discharge: 2020-07-17 | Disposition: A | Discharge status: Home / Self Care | Payer: Medicare HMO | Attending: Dentistry | Admitting: Dentistry

## 2020-07-17 ENCOUNTER — Other Ambulatory Visit: Payer: Self-pay

## 2020-07-17 ENCOUNTER — Ambulatory Visit (HOSPITAL_COMMUNITY): Payer: Medicare HMO | Admitting: Vascular Surgery

## 2020-07-17 DIAGNOSIS — F40232 Fear of other medical care: Secondary | ICD-10-CM | POA: Insufficient documentation

## 2020-07-17 DIAGNOSIS — Z85828 Personal history of other malignant neoplasm of skin: Secondary | ICD-10-CM | POA: Diagnosis not present

## 2020-07-17 DIAGNOSIS — K083 Retained dental root: Secondary | ICD-10-CM | POA: Insufficient documentation

## 2020-07-17 DIAGNOSIS — Z87891 Personal history of nicotine dependence: Secondary | ICD-10-CM | POA: Diagnosis not present

## 2020-07-17 DIAGNOSIS — E785 Hyperlipidemia, unspecified: Secondary | ICD-10-CM | POA: Diagnosis not present

## 2020-07-17 DIAGNOSIS — Z79899 Other long term (current) drug therapy: Secondary | ICD-10-CM | POA: Insufficient documentation

## 2020-07-17 DIAGNOSIS — H9190 Unspecified hearing loss, unspecified ear: Secondary | ICD-10-CM | POA: Diagnosis not present

## 2020-07-17 DIAGNOSIS — Z7982 Long term (current) use of aspirin: Secondary | ICD-10-CM | POA: Insufficient documentation

## 2020-07-17 DIAGNOSIS — K0889 Other specified disorders of teeth and supporting structures: Secondary | ICD-10-CM | POA: Insufficient documentation

## 2020-07-17 DIAGNOSIS — I2510 Atherosclerotic heart disease of native coronary artery without angina pectoris: Secondary | ICD-10-CM | POA: Diagnosis not present

## 2020-07-17 DIAGNOSIS — Z95820 Peripheral vascular angioplasty status with implants and grafts: Secondary | ICD-10-CM | POA: Diagnosis not present

## 2020-07-17 DIAGNOSIS — I739 Peripheral vascular disease, unspecified: Secondary | ICD-10-CM | POA: Diagnosis not present

## 2020-07-17 DIAGNOSIS — K045 Chronic apical periodontitis: Secondary | ICD-10-CM | POA: Diagnosis present

## 2020-07-17 DIAGNOSIS — K029 Dental caries, unspecified: Secondary | ICD-10-CM

## 2020-07-17 DIAGNOSIS — C119 Malignant neoplasm of nasopharynx, unspecified: Secondary | ICD-10-CM | POA: Diagnosis not present

## 2020-07-17 DIAGNOSIS — K0530 Chronic periodontitis, unspecified: Secondary | ICD-10-CM

## 2020-07-17 HISTORY — PX: MULTIPLE EXTRACTIONS WITH ALVEOLOPLASTY: SHX5342

## 2020-07-17 SURGERY — MULTIPLE EXTRACTION WITH ALVEOLOPLASTY
Anesthesia: General

## 2020-07-17 MED ORDER — 0.9 % SODIUM CHLORIDE (POUR BTL) OPTIME
TOPICAL | Status: DC | PRN
  Administered 2020-07-17: 1000 mL

## 2020-07-17 MED ORDER — LACTATED RINGERS IV SOLN: INTRAVENOUS | Status: DC

## 2020-07-17 MED ORDER — OXYMETAZOLINE HCL 0.05 % NA SOLN
NASAL | Status: AC
  Filled 2020-07-17: qty 30

## 2020-07-17 MED ORDER — DEXAMETHASONE SODIUM PHOSPHATE 10 MG/ML IJ SOLN
INTRAMUSCULAR | Status: DC | PRN
  Administered 2020-07-17: 4 mg via INTRAVENOUS

## 2020-07-17 MED ORDER — FENTANYL CITRATE (PF) 100 MCG/2ML IJ SOLN
25.0000 ug | INTRAMUSCULAR | Status: DC | PRN
  Administered 2020-07-17 (×3): 25 ug via INTRAVENOUS

## 2020-07-17 MED ORDER — DEXMEDETOMIDINE (PRECEDEX) IN NS 20 MCG/5ML (4 MCG/ML) IV SYRINGE
PREFILLED_SYRINGE | INTRAVENOUS | Status: DC | PRN
  Administered 2020-07-17 (×2): 8 ug via INTRAVENOUS
  Administered 2020-07-17: 4 ug via INTRAVENOUS

## 2020-07-17 MED ORDER — SUGAMMADEX SODIUM 200 MG/2ML IV SOLN
INTRAVENOUS | Status: DC | PRN
  Administered 2020-07-17: 200 mg via INTRAVENOUS

## 2020-07-17 MED ORDER — LIDOCAINE 2% (20 MG/ML) 5 ML SYRINGE
INTRAMUSCULAR | Status: DC | PRN
  Administered 2020-07-17: 100 mg via INTRAVENOUS

## 2020-07-17 MED ORDER — BUPIVACAINE-EPINEPHRINE 0.5% -1:200000 IJ SOLN
INTRAMUSCULAR | Status: DC | PRN
  Administered 2020-07-17: 3.4 mL

## 2020-07-17 MED ORDER — THROMBIN 5000 UNITS EX SOLR
CUTANEOUS | Status: AC
  Filled 2020-07-17: qty 5000

## 2020-07-17 MED ORDER — LIDOCAINE 2% (20 MG/ML) 5 ML SYRINGE
INTRAMUSCULAR | Status: AC
  Filled 2020-07-17: qty 5

## 2020-07-17 MED ORDER — OXYCODONE HCL 5 MG PO TABS: 5.0000 mg | ORAL_TABLET | Freq: Once | ORAL | Status: DC | PRN

## 2020-07-17 MED ORDER — ACETAMINOPHEN 10 MG/ML IV SOLN
INTRAVENOUS | Status: AC
  Filled 2020-07-17: qty 100

## 2020-07-17 MED ORDER — HEMOSTATIC AGENTS (NO CHARGE) OPTIME
TOPICAL | Status: DC | PRN
  Administered 2020-07-17: 1 via TOPICAL

## 2020-07-17 MED ORDER — CHLORHEXIDINE GLUCONATE 0.12 % MT SOLN
15.0000 mL | Freq: Once | OROMUCOSAL | Status: AC
  Administered 2020-07-17: 15 mL via OROMUCOSAL
  Filled 2020-07-17: qty 15

## 2020-07-17 MED ORDER — BUPIVACAINE-EPINEPHRINE (PF) 0.5% -1:200000 IJ SOLN
INTRAMUSCULAR | Status: AC
  Filled 2020-07-17: qty 3.6

## 2020-07-17 MED ORDER — ACETAMINOPHEN 325 MG PO TABS: 325.0000 mg | ORAL_TABLET | ORAL | Status: DC | PRN

## 2020-07-17 MED ORDER — CEFAZOLIN SODIUM-DEXTROSE 2-4 GM/100ML-% IV SOLN
2.0000 g | Freq: Once | INTRAVENOUS | Status: AC
  Administered 2020-07-17: 2 g via INTRAVENOUS
  Filled 2020-07-17: qty 100

## 2020-07-17 MED ORDER — MEPERIDINE HCL 25 MG/ML IJ SOLN: 6.2500 mg | INTRAMUSCULAR | Status: DC | PRN

## 2020-07-17 MED ORDER — ONDANSETRON HCL 4 MG/2ML IJ SOLN: 4.0000 mg | Freq: Once | INTRAMUSCULAR | Status: DC | PRN

## 2020-07-17 MED ORDER — PROPOFOL 10 MG/ML IV BOLUS
INTRAVENOUS | Status: AC
  Filled 2020-07-17: qty 20

## 2020-07-17 MED ORDER — PROPOFOL 10 MG/ML IV BOLUS
INTRAVENOUS | Status: DC | PRN
  Administered 2020-07-17: 150 mg via INTRAVENOUS

## 2020-07-17 MED ORDER — ROCURONIUM BROMIDE 10 MG/ML (PF) SYRINGE
PREFILLED_SYRINGE | INTRAVENOUS | Status: AC
  Filled 2020-07-17: qty 10

## 2020-07-17 MED ORDER — FENTANYL CITRATE (PF) 250 MCG/5ML IJ SOLN
INTRAMUSCULAR | Status: AC
  Filled 2020-07-17: qty 5

## 2020-07-17 MED ORDER — ORAL CARE MOUTH RINSE: 15.0000 mL | Freq: Once | OROMUCOSAL | Status: AC

## 2020-07-17 MED ORDER — PHENYLEPHRINE HCL-NACL 10-0.9 MG/250ML-% IV SOLN
INTRAVENOUS | Status: DC | PRN
  Administered 2020-07-17: 40 ug/min via INTRAVENOUS

## 2020-07-17 MED ORDER — OXYCODONE HCL 5 MG/5ML PO SOLN: 5.0000 mg | Freq: Once | ORAL | Status: DC | PRN

## 2020-07-17 MED ORDER — LIDOCAINE-EPINEPHRINE 2 %-1:100000 IJ SOLN
INTRAMUSCULAR | Status: DC | PRN
  Administered 2020-07-17 (×4): 1.7 mL via INTRADERMAL

## 2020-07-17 MED ORDER — LIDOCAINE-EPINEPHRINE 2 %-1:100000 IJ SOLN
INTRAMUSCULAR | Status: AC
  Filled 2020-07-17: qty 10.2

## 2020-07-17 MED ORDER — HYDROCODONE-ACETAMINOPHEN 5-325 MG PO TABS: 1.0000 | ORAL_TABLET | ORAL | Status: DC | PRN

## 2020-07-17 MED ORDER — ROCURONIUM BROMIDE 10 MG/ML (PF) SYRINGE
PREFILLED_SYRINGE | INTRAVENOUS | Status: DC | PRN
  Administered 2020-07-17: 60 mg via INTRAVENOUS

## 2020-07-17 MED ORDER — ONDANSETRON HCL 4 MG/2ML IJ SOLN
INTRAMUSCULAR | Status: AC
  Filled 2020-07-17: qty 2

## 2020-07-17 MED ORDER — FENTANYL CITRATE (PF) 100 MCG/2ML IJ SOLN
INTRAMUSCULAR | Status: AC
  Filled 2020-07-17: qty 2

## 2020-07-17 MED ORDER — AMINOCAPROIC ACID SOLUTION 5% (50 MG/ML)
5.0000 mL | ORAL | Status: DC
  Administered 2020-07-17: 5 mL via ORAL
  Filled 2020-07-17: qty 100

## 2020-07-17 MED ORDER — DEXAMETHASONE SODIUM PHOSPHATE 10 MG/ML IJ SOLN
INTRAMUSCULAR | Status: AC
  Filled 2020-07-17: qty 1

## 2020-07-17 MED ORDER — ONDANSETRON HCL 4 MG/2ML IJ SOLN
INTRAMUSCULAR | Status: DC | PRN
  Administered 2020-07-17: 4 mg via INTRAVENOUS

## 2020-07-17 MED ORDER — ACETAMINOPHEN 10 MG/ML IV SOLN: 1000.0000 mg | Freq: Once | INTRAVENOUS | Status: DC | PRN

## 2020-07-17 MED ORDER — THROMBIN (RECOMBINANT) 5000 UNITS EX SOLR
CUTANEOUS | Status: AC
  Filled 2020-07-17: qty 5000

## 2020-07-17 MED ORDER — ACETAMINOPHEN 160 MG/5ML PO SOLN: 325.0000 mg | ORAL | Status: DC | PRN

## 2020-07-17 SURGICAL SUPPLY — 40 items
ALCOHOL 70% 16 OZ (MISCELLANEOUS) ×2 IMPLANT
ATTRACTOMAT 16X20 MAGNETIC DRP (DRAPES) ×2 IMPLANT
BANDAGE HEMOSTAT MRDH 4X4 STRL (MISCELLANEOUS) IMPLANT
BLADE SURG 15 STRL LF DISP TIS (BLADE) ×2 IMPLANT
BLADE SURG 15 STRL SS (BLADE) ×4
BNDG HEMOSTAT MRDH 4X4 STRL (MISCELLANEOUS)
COVER SURGICAL LIGHT HANDLE (MISCELLANEOUS) ×2 IMPLANT
COVER WAND RF STERILE (DRAPES) ×2 IMPLANT
GAUZE 4X4 16PLY RFD (DISPOSABLE) ×2 IMPLANT
GAUZE PACKING FOLDED 2  STR (GAUZE/BANDAGES/DRESSINGS) ×2
GAUZE PACKING FOLDED 2 STR (GAUZE/BANDAGES/DRESSINGS) ×1 IMPLANT
GLOVE BIO SURGEON STRL SZ 6.5 (GLOVE) ×2 IMPLANT
GLOVE SURG ORTHO 8.0 STRL STRW (GLOVE) ×2 IMPLANT
GOWN STRL REUS W/ TWL LRG LVL3 (GOWN DISPOSABLE) ×1 IMPLANT
GOWN STRL REUS W/TWL 2XL LVL3 (GOWN DISPOSABLE) ×2 IMPLANT
GOWN STRL REUS W/TWL LRG LVL3 (GOWN DISPOSABLE) ×2
HEMOSTAT SURGICEL 2X14 (HEMOSTASIS) IMPLANT
KIT BASIN OR (CUSTOM PROCEDURE TRAY) ×2 IMPLANT
KIT TURNOVER KIT B (KITS) ×2 IMPLANT
MANIFOLD NEPTUNE II (INSTRUMENTS) ×2 IMPLANT
NDL BLUNT 16X1.5 OR ONLY (NEEDLE) IMPLANT
NDL DENTAL 27 LONG (NEEDLE) IMPLANT
NEEDLE BLUNT 16X1.5 OR ONLY (NEEDLE) IMPLANT
NEEDLE DENTAL 27 LONG (NEEDLE) IMPLANT
NS IRRIG 1000ML POUR BTL (IV SOLUTION) ×2 IMPLANT
PACK EENT II TURBAN DRAPE (CUSTOM PROCEDURE TRAY) ×2 IMPLANT
PAD ARMBOARD 7.5X6 YLW CONV (MISCELLANEOUS) ×2 IMPLANT
SPONGE SURGIFOAM ABS GEL 100 (HEMOSTASIS) IMPLANT
SPONGE SURGIFOAM ABS GEL 12-7 (HEMOSTASIS) IMPLANT
SPONGE SURGIFOAM ABS GEL SZ50 (HEMOSTASIS) ×1 IMPLANT
SUCTION FRAZIER HANDLE 10FR (MISCELLANEOUS) ×2
SUCTION TUBE FRAZIER 10FR DISP (MISCELLANEOUS) ×1 IMPLANT
SUT CHROMIC 3 0 PS 2 (SUTURE) ×5 IMPLANT
SUT CHROMIC 4 0 P 3 18 (SUTURE) IMPLANT
SYR 50ML SLIP (SYRINGE) ×2 IMPLANT
TOWEL GREEN STERILE (TOWEL DISPOSABLE) ×2 IMPLANT
TUBE CONNECTING 12X1/4 (SUCTIONS) ×2 IMPLANT
WATER STERILE IRR 1000ML POUR (IV SOLUTION) ×2 IMPLANT
WATER TABLETS ICX (MISCELLANEOUS) ×2 IMPLANT
YANKAUER SUCT BULB TIP NO VENT (SUCTIONS) ×2 IMPLANT

## 2020-07-17 NOTE — Op Note (Signed)
OPERATIVE REPORT  Patient:            Willie Kennedy Date of Birth:  May 21, 1951 MRN:                [id number]   DATE OF PROCEDURE:  07/17/2020  PREOPERATIVE DIAGNOSES: 1.  Nasopharyngeal cancer 2.  Prechemoradiation therapy dental protocol 3.  Retained root segments 4.  Chronic apical periodontitis 5.  Chronic periodontitis 6.  Loose teeth 7.  Dental phobia  POSTOPERATIVE DIAGNOSES: 1.  Nasopharyngeal cancer 2.  Prechemoradiation therapy dental protocol 3.  Retained root segments 4.  Chronic apical periodontitis 5.  Chronic periodontitis 6.  Loose teeth 7.  Dental phobia  OPERATIONS: 1. Multiple extraction of tooth numbers 6, 11, 22, 27, and 31 2. 4 Quadrants of alveoloplasty   SURGEON: Lenn Cal, DDS  ASSISTANT: Molli Posey (dental assistant)  ANESTHESIA: General anesthesia via oral endotracheal tube.  MEDICATIONS: 1. Ancef 2 g IV prior to invasive dental procedures. 2. Local anesthesia with a total utilization of 4 carpules each containing 34 mg of lidocaine with 0.017 mg of epinephrine as well as 2 carpules each containing 9 mg of bupivacaine with 0.009 mg of epinephrine.  SPECIMENS: There are 5 teeth that were discarded.  DRAINS: None  CULTURES: None  COMPLICATIONS: None  ESTIMATED BLOOD LOSS: 50 mLs.  INTRAVENOUS FLUIDS: 500 mLs of Lactated ringers solution.  INDICATIONS: The patient was recently diagnosed with nasopharyngeal cancer.  A medically necessary dental consultation was then requested to evaluate poor dentition.  The patient was examined and treatment planned for extraction of remaining teeth with alveoloplasty in the operating room with general anesthesia.  This treatment plan was formulated to decrease the risks and complications associated with dental infection from affecting the patient's systemic health and to prevent future complications such as infection and osteoradionecrosis.  OPERATIVE FINDINGS: Patient was examined  operating room number 9.  The teeth were identified for extraction. The patient was noted be affected by chronic apical periodontitis, dental caries, retained root segments, chronic periodontitis, loose teeth, and dental phobia.  DESCRIPTION OF PROCEDURE: Patient was brought to the main operating room number 9. Patient was then placed in the supine position on the operating table. General anesthesia was then induced per the anesthesia team. The patient was then prepped and draped in the usual manner for dental medicine procedure. A timeout was performed. The patient was identified and procedures were verified. A throat pack was placed at this time. The oral cavity was then thoroughly examined with the findings noted above. The patient was then ready for dental medicine procedure as follows:  Local anesthesia was then administered sequentially with a total utilization of 4 carpules each containing 34 mg of lidocaine with 0.017 mg of epinephrine as well as 2 carpules  each containing 9 mg bupivacaine with 0.009 mg of epinephrine.  The Maxillary left and right quadrants first approached. Anesthesia was then delivered utilizing infiltration with lidocaine with epinephrine. A #15 blade incision was then made from the distal of #4 and extended to the distal of #8.  A second 15 blade incision was made from the distal of #13 and extended to the mesial #9.  Surgical flaps were then carefully reflected.  Retained root segments in the area of tooth #6 and 11 were then removed with a 150 forceps without complications.  Alveoloplasty was then performed utilizing rongeurs and bone file to help achieve primary closure.  The tissues were approximated and trimmed appropriately.  The  surgical site was irrigated with copious amounts of sterile saline.  A piece of Surgifoam was placed in the extraction sockets of tooth numbers 6 and 11 appropriately.  The maxillary right surgical site was then closed from the distal of #4 and  extended the mesial number 8 utilizing 3-0 Chromic Gut suture in a continuous erupted suture technique x1.  Maxillary left surgical site was then closed from the distal #13 extended the mesial #9 utilizing 3-0 Chromic Gut suture in a continuous erupted suture technique x1.    At this point time, the mandibular quadrants were approached. The patient was given bilateral inferior alveolar nerve blocks and long buccal nerve blocks utilizing the bupivacaine with epinephrine. Further infiltration was then achieved utilizing the lidocaine with epinephrine. A 15 blade incision was then made from the distal of number 32 and extended to the mesial of #25.  A surgical flap was then carefully reflected. Tooth numbers 27 and 31 were then removed with a 151 forceps without complications.  Alveoloplasty was performed utilizing rongeurs and bone file to help achieve primary closure.  The surgical sites were irrigated with copious amounts sterile saline.  A piece of Surgifoam placed into extraction socket of tooth numbers 27 and 31.  The surgical site was then closed from the distal #32 and extended to the mesial of #25 utilizing 3-0 Chromic Gut suture in a continuous erupted suture technique x1.    Tooth #22 was then approached.  A 15 blade incision was made from the distal #20 extended to the mesial of #24.  A surgical flap was then carefully reflected.  Tooth #22 was then removed with a 151 forceps without complications.  Alveoloplasty was then performed utilizing rongeurs and bone file to help achieve primary closure.  The surgical site was irrigated with copious amounts sterile saline.  The tissues were approximated and trimmed appropriately.  A piece of Surgifoam was placed in the extraction socket #22.  The surgical site was then closed from the distal #20 extended the mesial #24 utilizing 3-0 Chromic Gut suture in a continuous interrupted suture technique x1.    At this point time, the entire mouth was irrigated with  copious amounts of sterile saline. The patient was examined for complications, seeing none, the dental medicine procedure was deemed to be complete. The throat pack was removed at this time. An oral airway was then placed at the request of the anesthesia team. A series of 4 x 4 gauze patient with Amicar 5% rinse were placed in the mouth to aid hemostasis. The patient was then handed over to the anesthesia team for final disposition. After an appropriate amount of time, the patient was extubated and taken to the postanesthsia care unit in good condition. All counts were correct for the dental medicine procedure.  Patient is to continue the Amicar 5% rinses postoperatively.  Patient is to rinse with 10 mls every hour for the next 10 hours in a swish and spit manner.   Lenn Cal, DDS.

## 2020-07-17 NOTE — Progress Notes (Signed)
PRE-OPERATIVE NOTE:  07/17/2020 Willie Kennedy [id number]  VITALS: BP (!) 168/81   Pulse 64   Temp (!) 97.5 F (36.4 C) (Oral)   Resp 20   Ht 5\' 5"  (1.651 m)   Wt 61.2 kg   SpO2 98%   BMI 22.47 kg/m   Lab Results  Component Value Date   WBC 7.1 07/14/2020   HGB 13.5 07/14/2020   HCT 41.8 07/14/2020   MCV 97.0 07/14/2020   PLT 315 07/14/2020   BMET    Component Value Date/Time   NA 138 07/14/2020 1428   K 4.5 07/14/2020 1428   CL 100 07/14/2020 1428   CO2 30 07/14/2020 1428   GLUCOSE 95 07/14/2020 1428   BUN 20 07/14/2020 1428   CREATININE 0.87 07/14/2020 1428   CREATININE 1.00 06/27/2015 1222   CALCIUM 9.5 07/14/2020 1428   GFRNONAA >60 07/14/2020 1428   GFRAA >60 07/14/2020 1428    Lab Results  Component Value Date   INR 1.03 08/15/2015   INR 0.9 ratio 09/15/2010   No results found for: PTT   Willie Kennedy presents for multiple dental extractions with alveoloplasty in the operating room with general anesthesia.   SUBJECTIVE: The patient denies any acute medical or dental changes and agrees to proceed with treatment as planned.  EXAM: No sign of acute dental changes.  ASSESSMENT: Patient is affected by chronic apical periodontitis, multiple retained root segments, dental caries, chronic periodontitis, loose teeth, and dental phobia.  PLAN: Patient agrees to proceed with treatment as planned in the operating room as previously discussed and accepts the risks, benefits, and complications of the proposed treatment. Patient is aware of the risk for bleeding, bruising, swelling, infection, pain, nerve damage, soft tissue damage, sinus involvement, root tip fracture, mandible fracture, and the risks of complications associated with the anesthesia. Patient also is aware of the potential for other complications not mentioned above.   Lenn Cal, DDS

## 2020-07-17 NOTE — Anesthesia Procedure Notes (Signed)
Procedure Name: Intubation Performed by: Milford Cage, CRNA Pre-anesthesia Checklist: Patient identified, Emergency Drugs available, Suction available and Patient being monitored Patient Re-evaluated:Patient Re-evaluated prior to induction Oxygen Delivery Method: Circle System Utilized Preoxygenation: Pre-oxygenation with 100% oxygen Induction Type: IV induction Ventilation: Mask ventilation without difficulty Laryngoscope Size: Mac and 3 Grade View: Grade I Tube type: Oral Tube size: 7.0 mm Number of attempts: 1 Airway Equipment and Method: Stylet and Oral airway Placement Confirmation: ETT inserted through vocal cords under direct vision,  positive ETCO2 and breath sounds checked- equal and bilateral Secured at: 21 cm Tube secured with: Tape Dental Injury: Teeth and Oropharynx as per pre-operative assessment

## 2020-07-17 NOTE — Discharge Instructions (Signed)

## 2020-07-17 NOTE — H&P (Signed)
07/17/2020  Patient:            Willie Kennedy Date of Birth:  December 23, 1950 MRN:                [id number]  BP (!) 168/81   Pulse 64   Temp (!) 97.5 F (36.4 C) (Oral)   Resp 20   Ht 5\' 5"  (1.651 m)   Wt 61.2 kg   SpO2 98%   BMI 22.47 kg/m    Willie Kennedy is a 69 year old male recently diagnosed with nasopharyngeal cancer.  Patient was seen as part of a medically necessary prechemoradiation therapy dental protocol examination.  Patient now presents for extraction of remaining teeth with alveoloplasty in the operating room with general anesthesia.  Patient denies any acute medical or dental changes.  Please see note from Dr. Melida Quitter dated 06/26/2020 to act as H&P for the dental operating room procedure.  Willie Kennedy, DDS  Melida Quitter, MD  Physician  OtolaryngologyENT  H&P    Signed  Date of Service:  06/26/2020 12:11 PM      Related encounter: Admission (Discharged) from 06/26/2020 in [name] is an 69 y.o. male.   Chief Complaint: Nasopharyngeal mass and hearing loss HPI: 69 year old male with pain in the right head for over a year.  He recently had a CT scan that demonstrated a large right nasopharyngeal mass that was visualized in the office.  He also has a stopped up right ear.  He presents for biopsy and tube placement.      Past Medical History:  Diagnosis Date  . Biceps tendon rupture    h/o Right biceps tendon rupture  . CAD (coronary artery disease)    a. ETT myoview (10/11) with EF 52%, reversible inferior perfusion defect, 1 mm inferolateral ST depression with exercise, 7"16" and stopped due to dyspnea and leg weakness -> LHC (10/11), EF 55%, 40% pRCA, 40% mRCA, 40% prox to mid CFX. medical treatment.  . Cancer ([place])    skin cancer behind left ear   . Dizziness    with bending over   . Hard of hearing    more per right ear  . Headache    with bright light   . History of  bronchitis   . Hyperlipidemia   . Impaired glucose tolerance   . Numbness and tingling in left arm    if holds arm upward too long pt states his primary MD is aware  . Osteoarthritis   . Peripheral vascular disease ([place]) 2005   a. s/p left fem-pop Dr.Lawson in 2005.  Marland Kitchen Shortness of breath dyspnea    walking distances or climbing stairs  . Tinnitus          Past Surgical History:  Procedure Laterality Date  . ds/p left femoral artery occlusion  2005   s/p left fem-pop bypass- Dr. Kellie Simmering  . PR VEIN BYPASS GRAFT,AORTO-FEM-POP    . s/p right bicep tendon rupture repair    . VENTRAL HERNIA REPAIR N/A 08/22/2015   Procedure: OPEN VENTRAL HERNIA REPAIR WITH MESH;  Surgeon: Jackolyn Confer, MD;  Location: [facility];  Service: General;  Laterality: N/A;         Family History  Problem Relation Age of Onset  . Cervical cancer Mother   . Cancer Mother        cervical  .  Lung cancer Father   . Cancer Father        lung   Social History:  reports that he quit smoking about 25 years ago. His smoking use included cigarettes. He has a 280.00 pack-year smoking history. He has never used smokeless tobacco. He reports that he does not drink alcohol and does not use drugs.  Allergies: No Known Allergies        Medications Prior to Admission  Medication Sig Dispense Refill  . atorvastatin (LIPITOR) 20 MG tablet     . citalopram (CELEXA) 10 MG tablet     . gabapentin (NEURONTIN) 300 MG capsule Take 2 capsules in AM, 3 capsules in evening (Patient taking differently: Take 300 mg by mouth. Take 3 capsules in AM, 3 capsules afternoon and 3 caps in evening) 450 capsule 3  . nortriptyline (PAMELOR) 10 MG capsule Take 1 capsule every night for 1 week, then increase to 2 capsules every night 60 capsule 11  . rOPINIRole (REQUIP) 0.25 MG tablet TAKE 1 TABLET BY MOUTH 2 HOURS BEFORE BEDTIME 90 tablet 3  . tiZANidine (ZANAFLEX) 4 MG tablet Take 1 tablet (4 mg total) by  mouth at bedtime. 30 tablet 11  . traMADol (ULTRAM) 50 MG tablet TAKE 1 TABLET BY MOUTH EVERY NIGHT (Patient taking differently: Take 50 mg by mouth 3 (three) times daily. ) 90 tablet 3  . aspirin 81 MG tablet Take 81 mg by mouth daily.     . nitroGLYCERIN (NITROSTAT) 0.4 MG SL tablet Place 1 tablet (0.4 mg total) under the tongue every 5 (five) minutes as needed. Place 1 tablet under tongue for chest pain -- not to exceed 3 in 15 minutes (Patient not taking: Reported on 05/19/2020) 100 tablet 11    Lab Results Last 48 Hours  No results found for this or any previous visit (from the past 48 hour(s)).   Imaging Results (Last 48 hours)  No results found.    Review of Systems  HENT: Positive for hearing loss.   All other systems reviewed and are negative.   Height 5\' 4"  (1.626 m), weight 60 kg. Physical Exam Constitutional:      Appearance: Normal appearance.  HENT:     Head: Normocephalic and atraumatic.     Right Ear: External ear normal.     Left Ear: External ear normal.     Ears:     Comments: Right middle ear effusion.    Nose: Nose normal.     Mouth/Throat:     Mouth: Mucous membranes are moist.     Pharynx: Oropharynx is clear.  Eyes:     Extraocular Movements: Extraocular movements intact.     Conjunctiva/sclera: Conjunctivae normal.     Pupils: Pupils are equal, round, and reactive to light.  Cardiovascular:     Rate and Rhythm: Normal rate.  Pulmonary:     Effort: Pulmonary effort is normal.  Skin:    General: Skin is warm.  Neurological:     General: No focal deficit present.     Mental Status: He is alert and oriented to person, place, and time.  Psychiatric:        Mood and Affect: Mood normal.        Behavior: Behavior normal.        Thought Content: Thought content normal.        Judgment: Judgment normal.      Assessment/Plan Right nasopharyngeal cancer and right chronic serous otitis media  To OR for  nasopharyngeal biopsy and right  myringotomy with tube placement.  Melida Quitter, MD 06/26/2020, 12:11 PM

## 2020-07-17 NOTE — Transfer of Care (Signed)
Immediate Anesthesia Transfer of Care Note  Patient: Willie Kennedy  Procedure(s) Performed: Extraction of tooth #'s [id number] with alveoloplasty (N/A )  Patient Location: PACU  Anesthesia Type:General  Level of Consciousness: drowsy  Airway & Oxygen Therapy: Patient Spontanous Breathing  Post-op Assessment: Report given to RN and Post -op Vital signs reviewed and stable  Post vital signs: Reviewed and stable  Last Vitals:  Vitals Value Taken Time  BP 128/63 07/17/20 1054  Temp    Pulse 69 07/17/20 1055  Resp 13 07/17/20 1055  SpO2 96 % 07/17/20 1055  Vitals shown include unvalidated device data.  Last Pain:  Vitals:   07/17/20 0821  TempSrc:   PainSc: 8          Complications: No complications documented.

## 2020-07-17 NOTE — Anesthesia Postprocedure Evaluation (Signed)
Anesthesia Post Note  Patient: Willie Kennedy  Procedure(s) Performed: Extraction of tooth #'s [id number] with alveoloplasty (N/A )     Patient location during evaluation: PACU Anesthesia Type: General Level of consciousness: awake and alert Pain management: pain level controlled Vital Signs Assessment: post-procedure vital signs reviewed and stable Respiratory status: spontaneous breathing, nonlabored ventilation and respiratory function stable Cardiovascular status: blood pressure returned to baseline and stable Postop Assessment: no apparent nausea or vomiting Anesthetic complications: no   No complications documented.  Last Vitals:  Vitals:   07/17/20 1124 07/17/20 1139  BP: (!) 157/72 (!) 152/83  Pulse: 65 68  Resp: 14 14  Temp:  36.6 C  SpO2: 95% 97%    Last Pain:  Vitals:   07/17/20 1139  TempSrc:   PainSc: 6                  Eyoel Throgmorton,W. EDMOND

## 2020-07-18 ENCOUNTER — Telehealth: Payer: Self-pay | Admitting: *Deleted

## 2020-07-18 ENCOUNTER — Encounter: Payer: Self-pay | Admitting: General Practice

## 2020-07-18 ENCOUNTER — Other Ambulatory Visit: Payer: Self-pay | Admitting: Radiation Oncology

## 2020-07-18 ENCOUNTER — Encounter: Payer: Self-pay | Admitting: Radiation Oncology

## 2020-07-18 DIAGNOSIS — C111 Malignant neoplasm of posterior wall of nasopharynx: Secondary | ICD-10-CM | POA: Insufficient documentation

## 2020-07-18 DIAGNOSIS — R634 Abnormal weight loss: Secondary | ICD-10-CM

## 2020-07-18 NOTE — Telephone Encounter (Signed)
Called patient to inform of lab on 07-21-20 @ 2 pm, lvm for a return call

## 2020-07-18 NOTE — Progress Notes (Signed)
[place] Initial Psychosocial Assessment Clinical Social Work  Clinical Social Work contacted by phone to assess psychosocial, emotional, mental health, and spiritual needs of the patient. Patient not available, spoke w wife.   Barriers to care/review of distress screen:  - Transportation:  Do you anticipate any problems getting to appointments?  Do you have someone who can help run errands for you if you need it?  Family drives him to appointments; he does drive some on his own.  Wife cannot drive because she has had knee replacement several months ago.   - Help at home:  What is your living situation (alone, family, other)?  If you are physically unable to care for yourself, who would you call on to help you?  Lives w wife.   - Support system:  What does your support system look like?  Who would you call on if you needed some kind of practical help?  What if you needed someone to talk to for emotional support?  Has son who lives in [place], other family members are supportive and helpful as they can.  Brother and sister in law do most of the transporting.   - Finances:  Are you concerned about finances.  Considering returning to work?  If not, applying for disability?  Retired, do have financial concerns.  "Since we are both retired, all we get are Social Security checks and bills from the hospital."  Have been on payment plans from hospital.  Patient is retired Architectural technologist.  Applied for Medicaid at [place], "went there a couple of weeks ago and we were supposed to take some papers back."    [facility] DISTRESS SCREENING 07/15/2020  Screening Type Initial Screening  Distress experienced in past week (1-10) 8  Family Problem type Partner  Emotional problem type Nervousness/Anxiety;Adjusting to illness  Physical Problem type Pain;Sleep/insomnia;Loss of appetitie;Skin dry/itchy;Swollen arms/legs  Physician notified of physical symptoms Yes  Referral to clinical psychology No   Referral to clinical social work Yes  Referral to dietition Yes  Referral to financial advocate Yes  Referral to support programs Yes  Referral to palliative care No    What is your understanding of where you are with your cancer? Its cause?  Your treatment plan and what happens next?  Went to ENT because head and ear were hurting - found tumor in ear.  Diagnosed with Stage III nasopharyngeal cancer.    What are your worries for the future as you begin treatment for cancer? Wife is very worried about his cancer, "I want him to get better, he is always saying 'I don't care if I die now."    What are your hopes and priorities during your treatment? What is important to you? What are your goals for your care?  Wants to take care of his wife.    CSW Summary:  Patient and family psychosocial functioning including strengths, limitations, and coping skills:  69 year old married male, newly diagnosed w nasopharyngeal cancer, per wife he "had a tumor in his ear."  In process of treatment planning and getting dental work completed.  Wife had knee replacement surgery and cannot drive - depends on family members for transport.  Can be referred to [facility] transport if needed.  Wife very concerned about medical bills as they are on fixed income.  CSW and wife discussed common feeling and emotions when being diagnosed with cancer, and the importance of support during treatment. CSW informed wife of the support team and support  services at [facility]. CSW provided contact information and encouraged family to call with any questions or concerns.  Identifications of barriers to care: Per wife, "he cannot read at all."  He has tried to learn to read as an adult.  "Make sure he knows what you are saying so he can remember it", can have memory difficulties per wife.  "This cancer is making him nervous and forgetful."  Availability of community resources:  Advertising account executive, [place]  Clinical Social Worker follow up  needed: Yes.   [facility] [place], [place] Worker Phone:  [phone] Cell:  [phone]

## 2020-07-21 ENCOUNTER — Ambulatory Visit
Admission: RE | Admit: 2020-07-21 | Discharge: 2020-07-21 | Disposition: A | Discharge status: Home / Self Care | Payer: Medicare HMO | Source: Ambulatory Visit | Attending: Radiation Oncology | Admitting: Radiation Oncology

## 2020-07-21 ENCOUNTER — Other Ambulatory Visit: Payer: Self-pay

## 2020-07-21 ENCOUNTER — Other Ambulatory Visit: Payer: Self-pay | Admitting: Radiation Oncology

## 2020-07-21 ENCOUNTER — Other Ambulatory Visit (HOSPITAL_COMMUNITY)
Admission: RE | Admit: 2020-07-21 | Discharge: 2020-07-21 | Disposition: A | Discharge status: Home / Self Care | Payer: Medicare HMO | Source: Ambulatory Visit | Attending: Oncology | Admitting: Oncology

## 2020-07-21 ENCOUNTER — Encounter: Payer: Self-pay | Admitting: Oncology

## 2020-07-21 ENCOUNTER — Other Ambulatory Visit: Payer: Self-pay | Admitting: Oncology

## 2020-07-21 VITALS — BP 167/87 | HR 73 | Temp 98.2°F | Resp 20 | Wt 132.1 lb

## 2020-07-21 DIAGNOSIS — C119 Malignant neoplasm of nasopharynx, unspecified: Secondary | ICD-10-CM

## 2020-07-21 DIAGNOSIS — Z20822 Contact with and (suspected) exposure to covid-19: Secondary | ICD-10-CM | POA: Diagnosis not present

## 2020-07-21 DIAGNOSIS — R634 Abnormal weight loss: Secondary | ICD-10-CM

## 2020-07-21 DIAGNOSIS — Z01812 Encounter for preprocedural laboratory examination: Secondary | ICD-10-CM | POA: Diagnosis present

## 2020-07-21 DIAGNOSIS — C111 Malignant neoplasm of posterior wall of nasopharynx: Secondary | ICD-10-CM | POA: Diagnosis present

## 2020-07-21 LAB — CBC WITH DIFFERENTIAL (CANCER CENTER ONLY)
Abs Immature Granulocytes: 0.02 10*3/uL (ref 0.00–0.07)
Basophils Absolute: 0 10*3/uL (ref 0.0–0.1)
Basophils Relative: 1 %
Eosinophils Absolute: 0.1 10*3/uL (ref 0.0–0.5)
Eosinophils Relative: 1 %
HCT: 37.8 % — ABNORMAL LOW (ref 39.0–52.0)
Hemoglobin: 12.5 g/dL — ABNORMAL LOW (ref 13.0–17.0)
Immature Granulocytes: 0 %
Lymphocytes Relative: 25 %
Lymphs Abs: 1.8 10*3/uL (ref 0.7–4.0)
MCH: 31.1 pg (ref 26.0–34.0)
MCHC: 33.1 g/dL (ref 30.0–36.0)
MCV: 94 fL (ref 80.0–100.0)
Monocytes Absolute: 0.8 10*3/uL (ref 0.1–1.0)
Monocytes Relative: 10 %
Neutro Abs: 4.5 10*3/uL (ref 1.7–7.7)
Neutrophils Relative %: 63 %
Platelet Count: 321 10*3/uL (ref 150–400)
RBC: 4.02 MIL/uL — ABNORMAL LOW (ref 4.22–5.81)
RDW: 14.6 % (ref 11.5–15.5)
WBC Count: 7.2 10*3/uL (ref 4.0–10.5)
nRBC: 0 % (ref 0.0–0.2)

## 2020-07-21 LAB — TSH: TSH: 1.196 u[IU]/mL (ref 0.320–4.118)

## 2020-07-21 LAB — CMP (CANCER CENTER ONLY)
ALT: 11 U/L (ref 0–44)
AST: 16 U/L (ref 15–41)
Albumin: 3.8 g/dL (ref 3.5–5.0)
Alkaline Phosphatase: 94 U/L (ref 38–126)
Anion gap: 9 (ref 5–15)
BUN: 22 mg/dL (ref 8–23)
CO2: 26 mmol/L (ref 22–32)
Calcium: 9.7 mg/dL (ref 8.9–10.3)
Chloride: 102 mmol/L (ref 98–111)
Creatinine: 0.9 mg/dL (ref 0.61–1.24)
GFR, Est AFR Am: >60 mL/min (ref >60)
GFR, Estimated: >60 mL/min (ref >60)
Glucose, Bld: 97 mg/dL (ref 70–99)
Potassium: 4.3 mmol/L (ref 3.5–5.1)
Sodium: 137 mmol/L (ref 135–145)
Total Bilirubin: 0.8 mg/dL (ref 0.3–1.2)
Total Protein: 7.6 g/dL (ref 6.5–8.1)

## 2020-07-21 LAB — SARS CORONAVIRUS 2 (TAT 6-24 HRS): SARS Coronavirus 2: NEGATIVE

## 2020-07-21 MED ORDER — SODIUM CHLORIDE 0.9% FLUSH
10.0000 mL | Freq: Once | INTRAVENOUS | Status: AC
  Administered 2020-07-21: 10 mL via INTRAVENOUS

## 2020-07-21 MED ORDER — DEXAMETHASONE 2 MG PO TABS: ORAL_TABLET | ORAL | 0 refills | Status: DC

## 2020-07-21 MED ORDER — OXYCODONE HCL 5 MG PO TABS: 5.0000 mg | ORAL_TABLET | Freq: Once | ORAL | Status: DC

## 2020-07-21 MED ORDER — OXYCODONE HCL 5 MG PO TABS
5.0000 mg | ORAL_TABLET | Freq: Once | ORAL | Status: AC
  Administered 2020-07-21: 5 mg via ORAL
  Filled 2020-07-21: qty 1

## 2020-07-21 NOTE — Progress Notes (Signed)
Has armband been applied?  Yes.    Does patient have an allergy to IV contrast dye?: No.   Has patient ever received premedication for IV contrast dye?: No.   Does patient take metformin?: No.  Date of lab work: July 14, 2020 BUN: 20 CR: 0.87  IV site: forearm left, condition patent and no redness  Has IV site been added to flowsheet?  Yes.    BP (!) 167/87 (BP Location: Right Arm, Patient Position: Sitting)   Pulse 73   Temp 98.2 F (36.8 C) (Oral)   Resp 20   Wt 132 lb 2 oz (59.9 kg)   SpO2 97%   BMI 21.99 kg/m

## 2020-07-21 NOTE — Progress Notes (Signed)
Oncology Nurse Navigator Documentation  To provide support, encouragement and care continuity, met with Mr. Ishmael during his CT SIM.  He tolerated procedure without difficulty, denied questions/concerns.   I toured him to [facility] treatment area, explained procedures for lobby registration, arrival to Radiation Waiting, arrival to tmt area and preparation for tmt.  He voiced understanding.   I encouraged him to call me prior to 07/28/20 New Start.  I also met with Mr. Camplin, his sister-in-law Meredith Mody and brother-in-law to provide PEG/port education prior to 07/24/20 placement.  Provided port educational handout, showed example, provided guidance for post-surgical dsg removal, site care.  . Using  PEG teaching device   and Teach Back, provided education for PEG use and care, including: hand hygiene, gravity bolus administration of daily water flushes and nutritional supplement, fluids and medications; care of tube insertion site including daily dressing change and cleaning; S&S of infection.   . Mr  Romanoski  correctly verbalized procedures for and provided correct return demonstration of gravity administration of water, dressing change and site care.  . I provided written instructions for PEG flushing/dressing change in support of verbal instruction.   . I provided/described contents of Start of Care Bolus Feeding Kit (3 60 cc syringes, 2 boxes 4x4 drainage sponges, 1 package mesh briefs, 1 roll paper tape, 1 case Osmolite 1.5).  He voiced understanding he is to start using Osmolite per guidance of Nutrition. Marland Kitchen He understands I will be available for ongoing PEG support. Provided barium sulfate prep which I obtained from [facility] IR, reviewed instructions which included guidance for today's  COVID screening at [facility].   Harlow Asa RN, BSN, OCN Head & Neck Oncology Nurse [place] at [facility] Phone # [phone]  Fax # [phone]

## 2020-07-22 ENCOUNTER — Other Ambulatory Visit: Payer: Self-pay | Admitting: Oncology

## 2020-07-22 ENCOUNTER — Telehealth: Payer: Self-pay

## 2020-07-22 ENCOUNTER — Encounter: Payer: Self-pay | Admitting: Oncology

## 2020-07-22 MED ORDER — OXYCODONE HCL 5 MG PO TABS
5.0000 mg | ORAL_TABLET | Freq: Every evening | ORAL | 0 refills | Status: DC | PRN
Start: 2020-07-22 — End: 2020-07-22

## 2020-07-22 MED ORDER — OXYCODONE HCL 5 MG PO TABS: 5.0000 mg | ORAL_TABLET | ORAL | 0 refills | Status: DC | PRN

## 2020-07-22 MED FILL — oxyCODONE HCL 5 MG TABS: 5 | 10 days supply | Qty: 60 | Fill #0

## 2020-07-22 NOTE — Telephone Encounter (Signed)
Return TC to Pt's sister in reference to her call stating Pt has been having ear pain. Informed Pt's sister that Dr. Alen Blew was not in the office at this time. Pt.'s sister informed me that Pt is in radiation, Informed Pt.'s sister to see if Dr. Isidore Moos could prescribe or give authorization to change dose on Pt's medication. Informed Pt's sister to give a call back if this was not possible.

## 2020-07-22 NOTE — Progress Notes (Signed)
Received voicemail Gwynn(sister) regarding applying for assistance.  Return call to introduce myself as Arboriculturist and to listen to concerns. Advised I met with patient on 8/17 to discuss the one-time $1000 [place] and qualifications to assist with personal expenses while going through treatment and what is needed. Provided her with the income guidlines and advised what is needed to apply. Advised appointment could be arranged on the day he has an appointment already. There was differences in what was showing for me and what she was given. Advised her to contact navigator to confirm schedule. Also advised Radiation has financial specialist which assist with the Radiation portion and I assist with chemo. Provided their phone numbers. She asked about [facility] and if they would assist with hearing aid, advised her to give them a call to receive accurate information on what services they provide.  Advised there is currently no copay assistance for his diagnosis, treatment and insurance at this time. Asked if she knows if he has met his ded/OOP and she states she is sure he has. She asked about other assistance which may help with the OOP balance. Advised he may apply for Medicaid through [place] and also contact Medicare to find out if there is any assistance through them.   She verbalized understanding and has my contact name and number for any additional financial questions or concerns.

## 2020-07-23 ENCOUNTER — Encounter: Payer: Self-pay | Admitting: Oncology

## 2020-07-23 ENCOUNTER — Other Ambulatory Visit: Payer: Self-pay | Admitting: Physician Assistant

## 2020-07-23 MED ORDER — DEXTROSE 5 % IV SOLN: 2.0000 g | Freq: Once | INTRAVENOUS | Status: DC

## 2020-07-24 ENCOUNTER — Ambulatory Visit (HOSPITAL_COMMUNITY)
Admission: RE | Admit: 2020-07-24 | Discharge: 2020-07-24 | Disposition: A | Discharge status: Home / Self Care | Payer: Medicare HMO | Source: Ambulatory Visit | Attending: Oncology | Admitting: Oncology

## 2020-07-24 ENCOUNTER — Telehealth: Payer: Self-pay | Admitting: Hematology & Oncology

## 2020-07-24 ENCOUNTER — Ambulatory Visit (HOSPITAL_COMMUNITY)
Admission: EM | Admit: 2020-07-24 | Discharge: 2020-07-24 | Disposition: A | Discharge status: Home / Self Care | Payer: Medicare HMO | Attending: Family Medicine | Admitting: Family Medicine

## 2020-07-24 ENCOUNTER — Other Ambulatory Visit: Payer: Self-pay | Admitting: Oncology

## 2020-07-24 ENCOUNTER — Telehealth: Payer: Self-pay | Admitting: Oncology

## 2020-07-24 ENCOUNTER — Ambulatory Visit (INDEPENDENT_AMBULATORY_CARE_PROVIDER_SITE_OTHER): Payer: Medicare HMO

## 2020-07-24 ENCOUNTER — Encounter (HOSPITAL_COMMUNITY): Payer: Self-pay

## 2020-07-24 ENCOUNTER — Encounter: Payer: Medicare HMO | Admitting: Nutrition

## 2020-07-24 ENCOUNTER — Other Ambulatory Visit: Payer: Self-pay

## 2020-07-24 DIAGNOSIS — I2510 Atherosclerotic heart disease of native coronary artery without angina pectoris: Secondary | ICD-10-CM | POA: Insufficient documentation

## 2020-07-24 DIAGNOSIS — E785 Hyperlipidemia, unspecified: Secondary | ICD-10-CM | POA: Insufficient documentation

## 2020-07-24 DIAGNOSIS — Z79899 Other long term (current) drug therapy: Secondary | ICD-10-CM | POA: Insufficient documentation

## 2020-07-24 DIAGNOSIS — Z87891 Personal history of nicotine dependence: Secondary | ICD-10-CM | POA: Insufficient documentation

## 2020-07-24 DIAGNOSIS — M62838 Other muscle spasm: Secondary | ICD-10-CM

## 2020-07-24 DIAGNOSIS — C119 Malignant neoplasm of nasopharynx, unspecified: Secondary | ICD-10-CM

## 2020-07-24 DIAGNOSIS — M542 Cervicalgia: Secondary | ICD-10-CM

## 2020-07-24 DIAGNOSIS — I739 Peripheral vascular disease, unspecified: Secondary | ICD-10-CM | POA: Insufficient documentation

## 2020-07-24 HISTORY — PX: IR IMAGING GUIDED PORT INSERTION: IMG5740

## 2020-07-24 HISTORY — PX: IR GASTROSTOMY TUBE MOD SED: IMG625

## 2020-07-24 LAB — CBC
HCT: 36.7 % — ABNORMAL LOW (ref 39.0–52.0)
Hemoglobin: 11.9 g/dL — ABNORMAL LOW (ref 13.0–17.0)
MCH: 30.7 pg (ref 26.0–34.0)
MCHC: 32.4 g/dL (ref 30.0–36.0)
MCV: 94.8 fL (ref 80.0–100.0)
Platelets: 315 10*3/uL (ref 150–400)
RBC: 3.87 MIL/uL — ABNORMAL LOW (ref 4.22–5.81)
RDW: 14.7 % (ref 11.5–15.5)
WBC: 7.2 10*3/uL (ref 4.0–10.5)
nRBC: 0 % (ref 0.0–0.2)

## 2020-07-24 LAB — PROTIME-INR
INR: 1 (ref 0.8–1.2)
Prothrombin Time: 12.9 seconds (ref 11.4–15.2)

## 2020-07-24 MED ORDER — FENTANYL CITRATE (PF) 100 MCG/2ML IJ SOLN
INTRAMUSCULAR | Status: AC
  Filled 2020-07-24: qty 2

## 2020-07-24 MED ORDER — MIDAZOLAM HCL 2 MG/2ML IJ SOLN
INTRAMUSCULAR | Status: AC
  Filled 2020-07-24: qty 2

## 2020-07-24 MED ORDER — BACITRACIN-NEOMYCIN-POLYMYXIN 400-5-5000 EX OINT
1.0000 "application " | TOPICAL_OINTMENT | Freq: Every day | CUTANEOUS | Status: DC
  Filled 2020-07-24: qty 1

## 2020-07-24 MED ORDER — FENTANYL CITRATE (PF) 100 MCG/2ML IJ SOLN
INTRAMUSCULAR | Status: DC | PRN
Start: 2020-07-24 — End: 2020-07-25
  Administered 2020-07-24: 25 ug via INTRAVENOUS

## 2020-07-24 MED ORDER — MIDAZOLAM HCL 2 MG/2ML IJ SOLN
INTRAMUSCULAR | Status: AC | PRN
  Administered 2020-07-24 (×2): 0.5 mg via INTRAVENOUS

## 2020-07-24 MED ORDER — LIDOCAINE HCL (PF) 1 % IJ SOLN
INTRAMUSCULAR | Status: AC | PRN
  Administered 2020-07-24: 20 mL

## 2020-07-24 MED ORDER — FENTANYL CITRATE (PF) 100 MCG/2ML IJ SOLN
INTRAMUSCULAR | Status: AC | PRN
  Administered 2020-07-24 (×2): 25 ug via INTRAVENOUS

## 2020-07-24 MED ORDER — LIDOCAINE HCL (PF) 1 % IJ SOLN
INTRAMUSCULAR | Status: AC
  Filled 2020-07-24: qty 30

## 2020-07-24 MED ORDER — MIDAZOLAM HCL 2 MG/2ML IJ SOLN
INTRAMUSCULAR | Status: DC | PRN
  Administered 2020-07-24: 0.5 mg via INTRAVENOUS

## 2020-07-24 MED ORDER — KETOROLAC TROMETHAMINE 30 MG/ML IJ SOLN
INTRAMUSCULAR | Status: AC
  Filled 2020-07-24: qty 1

## 2020-07-24 MED ORDER — IOHEXOL 300 MG/ML  SOLN
50.0000 mL | Freq: Once | INTRAMUSCULAR | Status: AC | PRN
  Administered 2020-07-24: 15 mL

## 2020-07-24 MED ORDER — HEPARIN SOD (PORK) LOCK FLUSH 100 UNIT/ML IV SOLN
INTRAVENOUS | Status: AC | PRN
  Administered 2020-07-24: 500 [IU] via INTRAVENOUS

## 2020-07-24 MED ORDER — HEPARIN SOD (PORK) LOCK FLUSH 100 UNIT/ML IV SOLN
INTRAVENOUS | Status: AC
  Filled 2020-07-24: qty 5

## 2020-07-24 MED ORDER — KETOROLAC TROMETHAMINE 15 MG/ML IJ SOLN
15.0000 mg | Freq: Once | INTRAMUSCULAR | Status: AC
  Administered 2020-07-24: 15 mg via INTRAMUSCULAR

## 2020-07-24 MED ORDER — SODIUM CHLORIDE 0.9 % IV SOLN: INTRAVENOUS | Status: DC

## 2020-07-24 MED ORDER — LIDOCAINE HCL (PF) 1 % IJ SOLN
INTRAMUSCULAR | Status: DC | PRN
  Administered 2020-07-24: 5 mL

## 2020-07-24 MED ORDER — CEFAZOLIN SODIUM-DEXTROSE 2-4 GM/100ML-% IV SOLN
INTRAVENOUS | Status: AC
  Administered 2020-07-24: 2 g
  Filled 2020-07-24: qty 100

## 2020-07-24 NOTE — Discharge Instructions (Addendum)
Take oxycodone 5 mg for pain.  If you do not have relief in 30 to 45 minutes you may take a second pill. Caution drowsiness Oxycodone can cause constipation Take tizanidine as needed as muscle relaxer.  Again, after 1 pill if you do not get relief you may take a second pill. Use ice or heat to painful muscles Expect improvement over the next couple of days Call your primary care doctor for problems

## 2020-07-24 NOTE — ED Provider Notes (Signed)
[place]    CSN: [id number] Arrival date & time: 07/24/20  1236      History   Chief Complaint Chief Complaint  Patient presents with  . Neck Pain    HPI Willie Kennedy is a 69 y.o. male.   HPI  Patient states that he was riding a riding lawnmower 3 days ago when he had a sudden jarring "whiplash" injury to his neck.  He said moderate pain since that time with some stiffness.  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  He states that after lying on the table when he tried to get up he had an excruciating pain in his neck.  He can now hardly move.  He took an oxycodone for pain about 10 minutes before arriving.  He states it has not kicked in yet.  The pain is not radiating.  It is in the central lower neck region.  No numbness or weakness in arms and legs.  No problems with balance.  Any movement exacerbates the pain and he is moving quite cautiously with slow guarded positioning. Patient is under the care of oncology for his nasopharyngeal cancer.  He is to have chemotherapy and radiation.  Plan is curative.  Past Medical History:  Diagnosis Date  . Biceps tendon rupture    h/o Right biceps tendon rupture  . CAD (coronary artery disease)    a. ETT myoview (10/11) with EF 52%, reversible inferior perfusion defect, 1 mm inferolateral ST depression with exercise, 7"16" and stopped due to dyspnea and leg weakness -> LHC (10/11), EF 55%, 40% pRCA, 40% mRCA, 40% prox to mid CFX. medical treatment.  . Cancer ([name])    skin cancer behind left ear   . Dizziness    with bending over   . Hard of hearing    more per right ear  . Headache    with bright light   . History of bronchitis   . History of hiatal hernia   . Hyperlipidemia   . Impaired glucose tolerance   . Numbness and tingling in left arm    if holds arm upward too long pt states his primary MD is aware  . Osteoarthritis   . Peripheral  vascular disease ([place]) 2005   a. s/p left fem-pop Dr.Lawson in 2005.  Marland Kitchen Shortness of breath dyspnea    walking distances or climbing stairs  . Tinnitus     Patient Active Problem List   Diagnosis Date Noted  . Carcinoma of posterior wall of nasopharynx ([name]) 07/18/2020  . Nasopharyngeal cancer ([name]) 07/08/2020  . Neuropathy 07/11/2019  . DJD (degenerative joint disease) 07/11/2019  . Decreased ROM of left elbow 10/03/2017  . Pain in left wrist 09/26/2017  . Biceps rupture, distal, left, initial encounter 06/27/2017  . Neuropathic pain 05/09/2017  . Complicated migraine 25/36/6440  . Restless leg syndrome 02/23/2017  . Transient alteration of awareness 08/06/2016  . Hyperreflexia 08/06/2016  . Muscle cramps 08/06/2016  . Left leg pain 02/06/2016  . Impacted ear wax 07/04/2015  . Hearing loss of both ears 07/04/2015  . Ventral hernia 05/16/2015  . Nocturnal leg cramps 05/16/2015  . Bruit 09/20/2014  . Chest pain 09/20/2014  . Aftercare following surgery of the circulatory system, [place] 08/07/2014  . Right elbow pain 01/10/2014  . Right leg pain 06/18/2013  . Goals of care, counseling/discussion 05/04/2013  . Impaired glucose tolerance 05/04/2013  . CAD, NATIVE  VESSEL 10/01/2010  . Nonspecific abnormal electrocardiogram (ECG) (EKG) 09/04/2010  . Hyperlipidemia 08/27/2010  . OSTEOARTHRITIS, HANDS, BILATERAL 08/27/2010  . Atherosclerosis of native arteries of extremity with intermittent claudication ([name]) 09/06/2008    Past Surgical History:  Procedure Laterality Date  . ds/p left femoral artery occlusion  2005   s/p left fem-pop bypass- Dr. Kellie Simmering  . HERNIA REPAIR    . IR GASTROSTOMY TUBE MOD SED  07/24/2020  . IR IMAGING GUIDED PORT INSERTION  07/24/2020  . MULTIPLE EXTRACTIONS WITH ALVEOLOPLASTY N/A 07/17/2020   Procedure: Extraction of tooth #'s 6,43,32,95,JOA 31 with alveoloplasty;  Surgeon: Lenn Cal, DDS;  Location: [name];  Service: Oral Surgery;  Laterality:  N/A;  . MYRINGOTOMY WITH TUBE PLACEMENT Right 06/26/2020   Procedure: MYRINGOTOMY WITH TUBE PLACEMENT RIGHT  EAR;  Surgeon: Melida Quitter, MD;  Location: [facility];  Service: ENT;  Laterality: Right;  . NASAL ENDOSCOPY N/A 06/26/2020   Procedure: NASAL ENDOSCOPY W/NASOPHARYNGEAL BIOPSY;  Surgeon: Melida Quitter, MD;  Location: [name];  Service: ENT;  Laterality: N/A;  . PR VEIN BYPASS GRAFT,AORTO-FEM-POP    . s/p right bicep tendon rupture repair    . VENTRAL HERNIA REPAIR N/A 08/22/2015   Procedure: OPEN VENTRAL HERNIA REPAIR WITH MESH;  Surgeon: Jackolyn Confer, MD;  Location: [facility];  Service: General;  Laterality: N/A;       Home Medications    Prior to Admission medications   Medication Sig Start Date End Date Taking? Authorizing Provider  atorvastatin (LIPITOR) 20 MG tablet Take 20 mg by mouth at bedtime.  01/22/20   [provider]  dexamethasone (DECADRON) 2 MG tablet Take 2 tablets daily, starting today.  Taper to 1 tablet daily on 8/26.  Taper to half a tablet daily on 9/2.  Take through 9/7.  Take with food. Patient taking differently: Take 2 mg by mouth See admin instructions. Take 4 mg 07/21/20, 3 mg on 8/24 ,Take 2 mg 07/23/20,  Take 1mg  the 07/24/20 then on the 07/25/20 take 1 mg  on as needed 07/21/20   Eppie Gibson, MD  gabapentin (NEURONTIN) 300 MG capsule Take 2 capsules in AM, 3 capsules in evening Patient taking differently: Take 900 mg by mouth 3 (three) times daily.  04/17/19   Cameron Sprang, MD  lidocaine-prilocaine (EMLA) cream Apply 1 application topically as needed. 07/08/20   Wyatt Portela, MD  nitroGLYCERIN (NITROSTAT) 0.4 MG SL tablet Place 1 tablet (0.4 mg total) under the tongue every 5 (five) minutes as needed. Place 1 tablet under tongue for chest pain -- not to exceed 3 in 15 minutes 05/04/13   Biagio Borg, MD  nortriptyline (PAMELOR) 10 MG capsule Take 1 capsule every night for 1 week, then increase to 2 capsules  every night Patient taking differently: Take 10 mg by mouth at bedtime.  05/19/20   Cameron Sprang, MD  oxyCODONE (OXY IR/ROXICODONE) 5 MG immediate release tablet Take 1 tablet (5 mg total) by mouth every 4 (four) hours as needed. 07/22/20   Wyatt Portela, MD  prochlorperazine (COMPAZINE) 10 MG tablet Take 1 tablet (10 mg total) by mouth every 6 (six) hours as needed for nausea or vomiting. 07/08/20   Wyatt Portela, MD  rOPINIRole (REQUIP) 0.25 MG tablet TAKE 1 TABLET BY MOUTH 2 HOURS BEFORE BEDTIME Patient taking differently: Take 0.25 mg by mouth at bedtime.  05/19/20   Cameron Sprang, MD  tiZANidine (ZANAFLEX) 4 MG tablet  Take 1 tablet (4 mg total) by mouth at bedtime. 05/19/20   Cameron Sprang, MD  traMADol (ULTRAM) 50 MG tablet TAKE 1 TABLET BY MOUTH EVERY NIGHT Patient taking differently: Take 50 mg by mouth 3 (three) times daily.  04/17/19   Cameron Sprang, MD    Family History Family History  Problem Relation Age of Onset  . Cervical cancer Mother   . Cancer Mother        cervical  . Lung cancer Father   . Cancer Father        lung    Social History Social History   Tobacco Use  . Smoking status: Former Smoker    Packs/day: 7.00    Years: 40.00    Pack years: 280.00    Types: Cigarettes    Quit date: 11/29/1994    Years since quitting: 25.6  . Smokeless tobacco: Never Used  Vaping Use  . Vaping Use: Never used  Substance Use Topics  . Alcohol use: No  . Drug use: No     Allergies   Patient has no known allergies.   Review of Systems Review of Systems See HPI  Physical Exam Triage Vital Signs ED Triage Vitals  Enc Vitals Group     BP 07/24/20 1254 (!) 181/87     Pulse Rate 07/24/20 1254 65     Resp 07/24/20 1254 18     Temp --      Temp src --      SpO2 07/24/20 1254 96 %     Weight --      Height --      Head Circumference --      Peak Flow --      Pain Score 07/24/20 1253 10     Pain Loc --      Pain Edu? --      Excl. in [place]? --    No  data found.  Updated Vital Signs BP (!) 181/87 (BP Location: Left Arm)   Pulse 65   Resp 18   SpO2 96%      Physical Exam Constitutional:      General: He is in acute distress.     Appearance: He is well-developed. He is ill-appearing.     Comments: Small elderly gentleman.  Grasping towel around his neck.  Reluctant to move.  In obvious discomfort.  HENT:     Head: Normocephalic and atraumatic.     Nose:     Comments: Mask in place Eyes:     Conjunctiva/sclera: Conjunctivae normal.     Pupils: Pupils are equal, round, and reactive to light.  Neck:     Comments: Patient hesitant to move neck in any direction.  Tenderness palpation in the right upper body of the trapezius muscle Cardiovascular:     Rate and Rhythm: Normal rate.  Pulmonary:     Effort: Pulmonary effort is normal. No respiratory distress.  Musculoskeletal:        General: Normal range of motion.  Skin:    General: Skin is warm and dry.  Neurological:     General: No focal deficit present.     Mental Status: He is alert.  Psychiatric:     Comments: In pain      UC Treatments / Results  Labs (all labs ordered are listed, but only abnormal results are displayed) Labs Reviewed - No data to display  EKG   Radiology Cervical spine x-ray performed.  He has multilevel  degenerative disc disease and some mild spondylolisthesis with no acute osseous findings.  Films and reading reviewed  Procedures Procedures (including critical care time)  Medications Ordered in UC Medications  ketorolac (TORADOL) 15 MG/ML injection 15 mg (15 mg Intramuscular Given 07/24/20 1356)    Initial Impression / Assessment and Plan / UC Course  I have reviewed the triage vital signs and the nursing notes.  Pertinent labs & imaging results that were available during my care of the patient were reviewed by me and considered in my medical decision making (see chart for details).     I feel like he has a muscular neck pain with  an acute muscle spasm that is quite painful for him.  He has oxycodone at home.  He has tizanidine at home.  He is given a shot of Toradol so he can get home comfortably to rest and use these medications. Final Clinical Impressions(s) / UC Diagnoses   Final diagnoses:  Trapezius muscle spasm     Discharge Instructions     Take oxycodone 5 mg for pain.  If you do not have relief in 30 to 45 minutes you may take a second pill. Caution drowsiness Oxycodone can cause constipation Take tizanidine as needed as muscle relaxer.  Again, after 1 pill if you do not get relief you may take a second pill. Use ice or heat to painful muscles Expect improvement over the next couple of days Call your primary care doctor for problems    ED Prescriptions    None     PDMP not reviewed this encounter.   Raylene Everts, MD 07/24/20 2228

## 2020-07-24 NOTE — ED Triage Notes (Signed)
Pt c/o severe posterior neck pain onset approx 1 hour PTA. Pt had surgical procedure today to place infusaport and feeding tube. S/p surgery, pt reported severe posterior neck pain. Pt states that three days ago, he was "jolted' while riding his lawnmower and neck "whipped".  Reports positive sensation to hands/feet. Pt unable to walk/sit comfortably, using towel to self-stabilize neck. Ice applied to posterior neck area and pt placed in wheelchair. Pt took oxycodone approx 30 min PTA at [facility].  Dr. Meda Coffee informed of pt condition/arrival.  Pt reports that ice "making it hurt worse".

## 2020-07-24 NOTE — H&P (Signed)
Chief Complaint: Patient was seen in consultation today for port-a-catheter and gastrostomy tube placements  Referring Physician(s): Wyatt Portela  Supervising Physician: Corrie Mckusick  Patient Status: [facility] - Out-pt  History of Present Illness: Willie Kennedy is a 69 y.o. male with a medical history significant for CAD, PVD, biceps tendon rupture and a recent diagnosis of nasopharyngeal cancer. He was evaluated by his doctor after several months of ear pain and weight loss. CT imaging of the neck in July 2021 showed a right-sided nasopharyngeal mass eroding the right occipital condyle. He next underwent nasopharyngeal biopsy, right myringotomy and tube placement on 06/26/2020. Final pathology showed invasive squamous cell carcinoma that is moderately differentiated. He will begin treatment soon.   Interventional Radiology has been asked to evaluate this patient for image-guided port-a-catheter and gastrostomy tube placements to facilitate his treatment and nutrition goals.  Past Medical History:  Diagnosis Date  . Biceps tendon rupture    h/o Right biceps tendon rupture  . CAD (coronary artery disease)    a. ETT myoview (10/11) with EF 52%, reversible inferior perfusion defect, 1 mm inferolateral ST depression with exercise, 7"16" and stopped due to dyspnea and leg weakness -> LHC (10/11), EF 55%, 40% pRCA, 40% mRCA, 40% prox to mid CFX. medical treatment.  . Cancer ([place])    skin cancer behind left ear   . Dizziness    with bending over   . Hard of hearing    more per right ear  . Headache    with bright light   . History of bronchitis   . History of hiatal hernia   . Hyperlipidemia   . Impaired glucose tolerance   . Numbness and tingling in left arm    if holds arm upward too long pt states his primary MD is aware  . Osteoarthritis   . Peripheral vascular disease ([place]) 2005   a. s/p left fem-pop Dr.Lawson in 2005.  Marland Kitchen Shortness of breath dyspnea    walking distances or  climbing stairs  . Tinnitus     Past Surgical History:  Procedure Laterality Date  . ds/p left femoral artery occlusion  2005   s/p left fem-pop bypass- Dr. Kellie Simmering  . HERNIA REPAIR    . MULTIPLE EXTRACTIONS WITH ALVEOLOPLASTY N/A 07/17/2020   Procedure: Extraction of tooth #'s 3,29,92,42,AST 31 with alveoloplasty;  Surgeon: Lenn Cal, DDS;  Location: [facility];  Service: Oral Surgery;  Laterality: N/A;  . MYRINGOTOMY WITH TUBE PLACEMENT Right 06/26/2020   Procedure: MYRINGOTOMY WITH TUBE PLACEMENT RIGHT  EAR;  Surgeon: Melida Quitter, MD;  Location: [facility];  Service: ENT;  Laterality: Right;  . NASAL ENDOSCOPY N/A 06/26/2020   Procedure: NASAL ENDOSCOPY W/NASOPHARYNGEAL BIOPSY;  Surgeon: Melida Quitter, MD;  Location: [facility];  Service: ENT;  Laterality: N/A;  . PR VEIN BYPASS GRAFT,AORTO-FEM-POP    . s/p right bicep tendon rupture repair    . VENTRAL HERNIA REPAIR N/A 08/22/2015   Procedure: OPEN VENTRAL HERNIA REPAIR WITH MESH;  Surgeon: Jackolyn Confer, MD;  [facility];  Service: General;  Laterality: N/A;    Allergies: Patient has no known allergies.  Medications: Prior to Admission medications   Medication Sig Start Date End Date Taking? Authorizing Provider  atorvastatin (LIPITOR) 20 MG tablet Take 20 mg by mouth at bedtime.  01/22/20  Yes [provider]  dexamethasone (DECADRON) 2 MG tablet Take 2 tablets daily, starting today.  Taper to 1 tablet daily on  8/26.  Taper to half a tablet daily on 9/2.  Take through 9/7.  Take with food. Patient taking differently: Take 2 mg by mouth See admin instructions. Take 4 mg 07/21/20, 3 mg on 8/24 ,Take 2 mg 07/23/20,  Take 1mg  the 07/24/20 then on the 07/25/20 take 1 mg  on as needed 07/21/20  Yes Eppie Gibson, MD  gabapentin (NEURONTIN) 300 MG capsule Take 2 capsules in AM, 3 capsules in evening Patient taking differently: Take 900 mg by mouth 3 (three) times daily.  04/17/19  Yes Cameron Sprang, MD  nitroGLYCERIN (NITROSTAT) 0.4 MG SL tablet Place 1 tablet (0.4 mg total) under the tongue every 5 (five) minutes as needed. Place 1 tablet under tongue for chest pain -- not to exceed 3 in 15 minutes 05/04/13  Yes Biagio Borg, MD  nortriptyline (PAMELOR) 10 MG capsule Take 1 capsule every night for 1 week, then increase to 2 capsules every night Patient taking differently: Take 10 mg by mouth at bedtime.  05/19/20  Yes Cameron Sprang, MD  rOPINIRole (REQUIP) 0.25 MG tablet TAKE 1 TABLET BY MOUTH 2 HOURS BEFORE BEDTIME Patient taking differently: Take 0.25 mg by mouth at bedtime.  05/19/20  Yes Cameron Sprang, MD  tiZANidine (ZANAFLEX) 4 MG tablet Take 1 tablet (4 mg total) by mouth at bedtime. 05/19/20  Yes Cameron Sprang, MD  traMADol (ULTRAM) 50 MG tablet TAKE 1 TABLET BY MOUTH EVERY NIGHT Patient taking differently: Take 50 mg by mouth 3 (three) times daily.  04/17/19  Yes Cameron Sprang, MD  lidocaine-prilocaine (EMLA) cream Apply 1 application topically as needed. 07/08/20   Wyatt Portela, MD  oxyCODONE (OXY IR/ROXICODONE) 5 MG immediate release tablet Take 1 tablet (5 mg total) by mouth every 4 (four) hours as needed. 07/22/20   Wyatt Portela, MD  prochlorperazine (COMPAZINE) 10 MG tablet Take 1 tablet (10 mg total) by mouth every 6 (six) hours as needed for nausea or vomiting. 07/08/20   Wyatt Portela, MD     Family History  Problem Relation Age of Onset  . Cervical cancer Mother   . Cancer Mother        cervical  . Lung cancer Father   . Cancer Father        lung    Social History   Socioeconomic History  . Marital status: Married    Spouse name: Not on file  . Number of children: 1  . Years of education: Not on file  . Highest education level: Not on file  Occupational History  . Occupation: Social research officer, government  Tobacco Use  . Smoking status: Former Smoker    Packs/day: 7.00    Years: 40.00    Pack years: 280.00    Types: Cigarettes    Quit date:  11/29/1994    Years since quitting: 25.6  . Smokeless tobacco: Never Used  Vaping Use  . Vaping Use: Never used  Substance and Sexual Activity  . Alcohol use: No  . Drug use: No  . Sexual activity: Not Currently  Other Topics Concern  . Not on file  Social History Narrative   Right handed      Highest level of edu- 11th grade      Lives in one story home      Social Determinants of Health   Financial Resource Strain: Medium Risk  . Difficulty of Paying Living Expenses: Somewhat hard  Food Insecurity: No Food Insecurity  .  Worried About Charity fundraiser in the Last Year: Never true  . Ran Out of Food in the Last Year: Never true  Transportation Needs: No Transportation Needs  . Lack of Transportation (Medical): No  . Lack of Transportation (Non-Medical): No  Physical Activity:   . Days of Exercise per Week: Not on file  . Minutes of Exercise per Session: Not on file  Stress:   . Feeling of Stress : Not on file  Social Connections:   . Frequency of Communication with Friends and Family: Not on file  . Frequency of Social Gatherings with Friends and Family: Not on file  . Attends Religious Services: Not on file  . Active Member of Clubs or Organizations: Not on file  . Attends Archivist Meetings: Not on file  . Marital Status: Not on file    Review of Systems: A 12 point ROS discussed and pertinent positives are indicated in the HPI above.  All other systems are negative.  Review of Systems  Constitutional: Positive for appetite change and fatigue.  Respiratory: Negative for cough and shortness of breath.   Cardiovascular: Negative for chest pain and leg swelling.  Gastrointestinal: Negative for abdominal pain, diarrhea, nausea and vomiting.  Musculoskeletal: Positive for neck pain.       Patient injured his neck while working outside.  Neurological: Positive for headaches. Negative for light-headedness.    Vital Signs: BP (!) 157/76   Pulse (!) 58    Temp 98.1 F (36.7 C) (Oral)   Resp 20   Ht 5\' 5"  (1.651 m)   Wt 123 lb (55.8 kg)   SpO2 97%   BMI 20.47 kg/m   Physical Exam Constitutional:      Appearance: He is ill-appearing.  HENT:     Mouth/Throat:     Mouth: Mucous membranes are moist.     Pharynx: Oropharynx is clear.     Comments: edentulous Cardiovascular:     Rate and Rhythm: Normal rate and regular rhythm.     Pulses: Normal pulses.     Heart sounds: Normal heart sounds.  Pulmonary:     Effort: Pulmonary effort is normal.     Breath sounds: Normal breath sounds.  Abdominal:     General: Bowel sounds are normal.     Palpations: Abdomen is soft.  Musculoskeletal:     Cervical back: Tenderness present.  Skin:    General: Skin is warm and dry.  Neurological:     Mental Status: He is alert and oriented to person, place, and time.     Imaging: NM PET Image Initial (PI) Skull Base To Thigh  Result Date: 07/07/2020 CLINICAL DATA:  Initial treatment strategy for head and neck cancer. EXAM: NUCLEAR MEDICINE PET SKULL BASE TO THIGH TECHNIQUE: 6.13 mCi F-18 FDG was injected intravenously. Full-ring PET imaging was performed from the skull base to thigh after the radiotracer. CT data was obtained and used for attenuation correction and anatomic localization. Fasting blood glucose: 89 mg/dl COMPARISON:  CT neck with contrast 06/05/2019 FINDINGS: Mediastinal blood pool activity: SUV max 2.3 Liver activity: SUV max NA NECK: There is intense FDG uptake associated with right posterior nasopharyngeal mass within SUV max of 24.35. As noted on previous CT the mass involves the right occipital condyle and petrous apex. No enlarged or FDG avid cervical lymph nodes identified bilaterally. Incidental CT findings: none CHEST: No FDG avid supraclavicular, axillary, mediastinal, or hilar lymph nodes. No FDG avid pulmonary nodules. Incidental CT findings:  Aortic atherosclerosis. Three vessel coronary artery atherosclerotic calcifications.  Advanced changes of paraseptal and centrilobular emphysema. Calcified granuloma is noted within the medial right lower lobe. ABDOMEN/PELVIS: No abnormal hypermetabolic activity within the liver, pancreas, adrenal glands, or spleen. No hypermetabolic lymph nodes in the abdomen or pelvis. Incidental CT findings: Bilateral kidney cysts. Small stone noted within inferior pole collecting system of the left kidney, image 126/4. Aortic atherosclerosis. SKELETON: No focal hypermetabolic activity to suggest skeletal metastasis. Incidental CT findings: none IMPRESSION: 1. Intense FDG uptake is associated with the right posterolateral nasopharyngeal mass which erodes into the right occipital condyle and petrous apex. 2. No signs of FDG avid cervical nodal metastasis or evidence of metastatic disease. 3. Left renal calculi. 4. Aortic Atherosclerosis (ICD10-I70.0) and Emphysema (ICD10-J43.9). Coronary artery calcifications. Electronically Signed   By: Kerby Moors M.D.   On: 07/07/2020 14:53    Labs:  CBC: Recent Labs    07/14/20 1428 07/21/20 1414  WBC 7.1 7.2  HGB 13.5 12.5*  HCT 41.8 37.8*  PLT 315 321    COAGS: No results for input(s): INR, APTT in the last 8760 hours.  BMP: Recent Labs    07/14/20 1428 07/21/20 1414  NA 138 137  K 4.5 4.3  CL 100 102  CO2 30 26  GLUCOSE 95 97  BUN 20 22  CALCIUM 9.5 9.7  CREATININE 0.87 0.90  GFRNONAA >60 >60  GFRAA >60 >60    LIVER FUNCTION TESTS: Recent Labs    07/21/20 1414  BILITOT 0.8  AST 16  ALT 11  ALKPHOS 94  PROT 7.6  ALBUMIN 3.8    TUMOR MARKERS: No results for input(s): AFPTM, CEA, CA199, CHROMGRNA in the last 8760 hours.  Assessment and Plan:  Nasopharyngeal carcinoma: Willie Kennedy, 69 year old male, presents today to [facility] Interventional Radiology department for image-guided port-a-catheter and gastrostomy tube placements. He has been recently diagnosed with this cancer and he will begin treatment chemotherapy  and radiation soon.   Risks and benefits of image-guided Port-a-catheter placement were discussed with the patient including, but not limited to bleeding, infection, pneumothorax, or fibrin sheath development and need for additional procedures.  Risks and benefits image guided gastrostomy tube placement was discussed with the patient including, but not limited to the need for a barium enema during the procedure, bleeding, infection, peritonitis and/or damage to adjacent structures.  All of the patient's questions were answered, patient is agreeable to proceed.  The patient has been NPO. Vitals have been reviewed. Labs are pending but will be reviewed prior to the start of the procedures.   Consent signed and in chart.  Thank you for this interesting consult.  I greatly enjoyed meeting Willie Kennedy and look forward to participating in their care.  A copy of this report was sent to the requesting provider on this date.  Electronically Signed: Soyla Dryer, AGACNP-BC [phone] 07/24/2020, 8:53 AM   I spent a total of  30 Minutes   in face to face in clinical consultation, greater than 50% of which was counseling/coordinating care for port-a-catheter and gastrostomy tube placements.

## 2020-07-24 NOTE — Discharge Instructions (Signed)
Implanted Port Insertion, Care After This sheet gives you information about how to care for yourself after your procedure. Your health care provider may also give you more specific instructions. If you have problems or questions, contact your health care provider. What can I expect after the procedure? After the procedure, it is common to have:  Discomfort at the port insertion site.  Bruising on the skin over the port. This should improve over 3-4 days. Follow these instructions at home: Port care  After your port is placed, you will get a manufacturer's information card. The card has information about your port. Keep this card with you at all times.  Take care of the port as told by your health care provider. Ask your health care provider if you or a family member can get training for taking care of the port at home. A home health care nurse may also take care of the port.  Make sure to remember what type of port you have. Incision care      Follow instructions from your health care provider about how to take care of your port insertion site. Make sure you: ? Wash your hands with soap and water before and after you change your bandage (dressing). If soap and water are not available, use hand sanitizer. ? Change your dressing as told by your health care provider. ? Leave stitches (sutures), skin glue, or adhesive strips in place. These skin closures may need to stay in place for 2 weeks or longer. If adhesive strip edges start to loosen and curl up, you may trim the loose edges. Do not remove adhesive strips completely unless your health care provider tells you to do that.  Check your port insertion site every day for signs of infection. Check for: ? Redness, swelling, or pain. ? Fluid or blood. ? Warmth. ? Pus or a bad smell. Activity  Return to your normal activities as told by your health care provider. Ask your health care provider what activities are safe for you.  Do not  lift anything that is heavier than 10 lb (4.5 kg), or the limit that you are told, until your health care provider says that it is safe. General instructions  Take over-the-counter and prescription medicines only as told by your health care provider.  Do not take baths, swim, or use a hot tub until your health care provider approves. Ask your health care provider if you may take showers. You may only be allowed to take sponge baths.  Do not drive for 24 hours if you were given a sedative during your procedure.  Wear a medical alert bracelet in case of an emergency. This will tell any health care providers that you have a port.  Keep all follow-up visits as told by your health care provider. This is important. Contact a health care provider if:  You cannot flush your port with saline as directed, or you cannot draw blood from the port.  You have a fever or chills.  You have redness, swelling, or pain around your port insertion site.  You have fluid or blood coming from your port insertion site.  Your port insertion site feels warm to the touch.  You have pus or a bad smell coming from the port insertion site. Get help right away if:  You have chest pain or shortness of breath.  You have bleeding from your port that you cannot control. Summary  Take care of the port as told by your health   care provider. Keep the manufacturer's information card with you at all times.  Change your dressing as told by your health care provider.  Contact a health care provider if you have a fever or chills or if you have redness, swelling, or pain around your port insertion site.  Keep all follow-up visits as told by your health care provider. This information is not intended to replace advice given to you by your health care provider. Make sure you discuss any questions you have with your health care provider. Document Revised: 06/13/2018 Document Reviewed: 06/13/2018 Elsevier Patient Education   [place]. Gastrostomy Tube Home Guide, Adult A gastrostomy tube, or G-tube, is a tube that is inserted through the abdomen into the stomach. The tube is used to give feedings and medicines when a person is unable to eat and drink enough on his or her own. How to care for a G-tube Supplies needed  Saline solution or clean, warm water and soap.  Cotton swab or gauze.  Precut gauze bandage (dressing) and tape, if needed. Instructions 1. Wash your hands with soap and water. 2. If there is a dressing between the person's skin and the tube, remove it. 3. Check the area where the tube enters the skin. Check for problems such as: ? Redness. ? Swelling. ? Pus-like drainage. ? Extra skin growth. 4. Moisten the cotton swab with the saline solution or soap and water mixture. Gently clean around the insertion site. Remove any drainage or crusting. ? When the G-tube is first put in, a normal saline solution or water can be used to clean the skin. ? Mild soap and warm water can be used when the skin around the G-tube site has healed. 5. If there should be a dressing between the person's skin and the tube, apply it at this time. How to flush a G-tube Flush the G-tube regularly to keep it from clogging. Flush it before and after feedings and as often as told by the health care provider. Supplies needed  Purified or sterile water, warmed. If the person has a weak disease-fighting (immune) system, or if he or she has difficulty fighting off infections (is immunocompromised), use only sterile water. ? If you are unsure about the amount of chemical contaminants in purified or drinking water, use sterile water. ? To purify drinking water by boiling:  Boil water for at least 1 minute. Keep lid over water while it boils. Allow water to cool to room temperature before using.  60cc G-tube syringe. Instructions 1. Wash your hands with soap and water. 2. Draw up 30 mL of warm water in a  syringe. 3. Connect the syringe to the tube. 4. Slowly and gently push the water into the tube. G-tube problems and solutions  If the tube comes out: ? Cover the opening with a clean dressing and tape. ? Call a health care provider right away. ? A health care provider will need to put the tube back in within 4 hours.  If there is skin or scar tissue growing where the tube enters the skin: ? Keep the area clean and dry. ? Secure the tube with tape so that the tube does not move around too much. ? Call a health care provider.  If the tube gets clogged: ? Slowly push warm water into the tube with a large syringe. ? Do not force the fluid into the tube or push an object into the tube. ? If you are not able to unclog the  tube, call a health care provider right away. Follow these instructions at home: Feedings  Give feedings at room temperature.  Cover and place unused feedings in the refrigerator.  If feedings are continuous: ? Do not put more than 4 hours worth of feedings in the feeding bag. ? Stop the feedings when you need to give medicine or flush the tube. Be sure to restart the feedings. ? Make sure the person's head is above his or her stomach (upright position). This will prevent choking and discomfort.  Replace feeding bags and syringes as told by the health care provider.  Make sure the person is in the right position during and after feedings: ? During feedings, the person's position should be in the upright position. ? After a noncontinuous feeding (bolus feeding), have the person stay in the upright position for 1 hour. General instructions  Only use syringes made for G-tubes.  Do not pull or put tension on the tube.  Clamp the tube before removing the cap or disconnecting a syringe.  Measure the length of the G-tube every day from the insertion site to the end of the tube.  If the person's G-tube has a balloon, check the fluid in the balloon every week. The  amount of fluid that should be in the balloon can be found in the manufacturer's specifications.  Make sure the person takes care of his or her oral health, such as by brushing his or her teeth.  Remove excess air from the G-tube as told by the person's health care provider. This is called "venting."  Keep the area where the tube enters the skin clean and dry.  Do not push feedings, medicines, or flushes rapidly. Contact a health care provider if:  The person with the tube has any of these problems: ? Constipation. ? Fever.  There is a large amount of fluid or mucus-like liquid leaking from the tube.  Skin or scar tissue appears to be growing where the tube enters the skin.  The length of tube from the insertion site to the G-tube gets longer. Get help right away if:  The person with the tube has any of these problems: ? Severe abdominal pain. ? Severe tenderness. ? Severe bloating. ? Nausea. ? Vomiting. ? Trouble breathing. ? Shortness of breath.  Any of these problems happen in the area where the tube enters the skin: ? Redness, irritation, swelling, or soreness. ? Pus-like discharge. ? A bad smell.  The tube is clogged and cannot be flushed.  The tube comes out. Summary  A gastrostomy tube, or G-tube, is a tube that is inserted through the abdomen into the stomach. The tube is used to give feedings and medicines when a person is unable to eat and drink enough on his or her own.  Check and clean the insertion site daily as told by the person's health care provider.  Flush the G-tube regularly to keep it from clogging. Flush it before and after feedings and as often as told by the person's health care provider.  Keep the area where the tube enters the skin clean and dry. This information is not intended to replace advice given to you by your health care provider. Make sure you discuss any questions you have with your health care provider. Document Revised: 10/28/2017  Document Reviewed: 01/10/2017 Elsevier Patient Education  2020 [employer].

## 2020-07-24 NOTE — Discharge Instructions (Addendum)
Implanted Port Insertion, Care After This sheet gives you information about how to care for yourself after your procedure. Your health care provider may also give you more specific instructions. If you have problems or questions, contact your health care provider. What can I expect after the procedure? After the procedure, it is common to have:  Discomfort at the port insertion site.  Bruising on the skin over the port. This should improve over 3-4 days. Follow these instructions at home: Port care  After your port is placed, you will get a manufacturer's information card. The card has information about your port. Keep this card with you at all times.  Take care of the port as told by your health care provider. Ask your health care provider if you or a family member can get training for taking care of the port at home. A home health care nurse may also take care of the port.  Make sure to remember what type of port you have. Incision care      Follow instructions from your health care provider about how to take care of your port insertion site. Make sure you: ? Wash your hands with soap and water before and after you change your bandage (dressing). If soap and water are not available, use hand sanitizer. ? Change your dressing as told by your health care provider. ? Leave stitches (sutures), skin glue, or adhesive strips in place. These skin closures may need to stay in place for 2 weeks or longer. If adhesive strip edges start to loosen and curl up, you may trim the loose edges. Do not remove adhesive strips completely unless your health care provider tells you to do that.  Check your port insertion site every day for signs of infection. Check for: ? Redness, swelling, or pain. ? Fluid or blood. ? Warmth. ? Pus or a bad smell. Activity  Return to your normal activities as told by your health care provider. Ask your health care provider what activities are safe for you.  Do not  lift anything that is heavier than 10 lb (4.5 kg), or the limit that you are told, until your health care provider says that it is safe. General instructions  Take over-the-counter and prescription medicines only as told by your health care provider.  Do not take baths, swim, or use a hot tub until your health care provider approves. Ask your health care provider if you may take showers. You may only be allowed to take sponge baths.  Do not drive for 24 hours if you were given a sedative during your procedure.  Wear a medical alert bracelet in case of an emergency. This will tell any health care providers that you have a port.  Keep all follow-up visits as told by your health care provider. This is important. Contact a health care provider if:  You cannot flush your port with saline as directed, or you cannot draw blood from the port.  You have a fever or chills.  You have redness, swelling, or pain around your port insertion site.  You have fluid or blood coming from your port insertion site.  Your port insertion site feels warm to the touch.  You have pus or a bad smell coming from the port insertion site. Get help right away if:  You have chest pain or shortness of breath.  You have bleeding from your port that you cannot control. Summary  Take care of the port as told by your health   care provider. Keep the manufacturer's information card with you at all times.  Change your dressing as told by your health care provider.  Contact a health care provider if you have a fever or chills or if you have redness, swelling, or pain around your port insertion site.  Keep all follow-up visits as told by your health care provider. This information is not intended to replace advice given to you by your health care provider. Make sure you discuss any questions you have with your health care provider. Document Revised: 06/13/2018 Document Reviewed: 06/13/2018 Elsevier Patient Education   [place]. Gastrostomy Tube Home Guide, Adult A gastrostomy tube, or G-tube, is a tube that is inserted through the abdomen into the stomach. The tube is used to give feedings and medicines when a person is unable to eat and drink enough on his or her own. How to care for a G-tube Supplies needed  Saline solution or clean, warm water and soap.  Cotton swab or gauze.  Precut gauze bandage (dressing) and tape, if needed. Instructions 1. Wash your hands with soap and water. 2. If there is a dressing between the person's skin and the tube, remove it. 3. Check the area where the tube enters the skin. Check for problems such as: ? Redness. ? Swelling. ? Pus-like drainage. ? Extra skin growth. 4. Moisten the cotton swab with the saline solution or soap and water mixture. Gently clean around the insertion site. Remove any drainage or crusting. ? When the G-tube is first put in, a normal saline solution or water can be used to clean the skin. ? Mild soap and warm water can be used when the skin around the G-tube site has healed. 5. If there should be a dressing between the person's skin and the tube, apply it at this time. How to flush a G-tube Flush the G-tube regularly to keep it from clogging. Flush it before and after feedings and as often as told by the health care provider. Supplies needed  Purified or sterile water, warmed. If the person has a weak disease-fighting (immune) system, or if he or she has difficulty fighting off infections (is immunocompromised), use only sterile water. ? If you are unsure about the amount of chemical contaminants in purified or drinking water, use sterile water. ? To purify drinking water by boiling:  Boil water for at least 1 minute. Keep lid over water while it boils. Allow water to cool to room temperature before using.  60cc G-tube syringe. Instructions 1. Wash your hands with soap and water. 2. Draw up 30 mL of warm water in a  syringe. 3. Connect the syringe to the tube. 4. Slowly and gently push the water into the tube. G-tube problems and solutions  If the tube comes out: ? Cover the opening with a clean dressing and tape. ? Call a health care provider right away. ? A health care provider will need to put the tube back in within 4 hours.  If there is skin or scar tissue growing where the tube enters the skin: ? Keep the area clean and dry. ? Secure the tube with tape so that the tube does not move around too much. ? Call a health care provider.  If the tube gets clogged: ? Slowly push warm water into the tube with a large syringe. ? Do not force the fluid into the tube or push an object into the tube. ? If you are not able to unclog the  tube, call a health care provider right away. Follow these instructions at home: Feedings  Give feedings at room temperature.  Cover and place unused feedings in the refrigerator.  If feedings are continuous: ? Do not put more than 4 hours worth of feedings in the feeding bag. ? Stop the feedings when you need to give medicine or flush the tube. Be sure to restart the feedings. ? Make sure the person's head is above his or her stomach (upright position). This will prevent choking and discomfort.  Replace feeding bags and syringes as told by the health care provider.  Make sure the person is in the right position during and after feedings: ? During feedings, the person's position should be in the upright position. ? After a noncontinuous feeding (bolus feeding), have the person stay in the upright position for 1 hour. General instructions  Only use syringes made for G-tubes.  Do not pull or put tension on the tube.  Clamp the tube before removing the cap or disconnecting a syringe.  Measure the length of the G-tube every day from the insertion site to the end of the tube.  If the person's G-tube has a balloon, check the fluid in the balloon every week. The  amount of fluid that should be in the balloon can be found in the manufacturer's specifications.  Make sure the person takes care of his or her oral health, such as by brushing his or her teeth.  Remove excess air from the G-tube as told by the person's health care provider. This is called "venting."  Keep the area where the tube enters the skin clean and dry.  Do not push feedings, medicines, or flushes rapidly. Contact a health care provider if:  The person with the tube has any of these problems: ? Constipation. ? Fever.  There is a large amount of fluid or mucus-like liquid leaking from the tube.  Skin or scar tissue appears to be growing where the tube enters the skin.  The length of tube from the insertion site to the G-tube gets longer. Get help right away if:  The person with the tube has any of these problems: ? Severe abdominal pain. ? Severe tenderness. ? Severe bloating. ? Nausea. ? Vomiting. ? Trouble breathing. ? Shortness of breath.  Any of these problems happen in the area where the tube enters the skin: ? Redness, irritation, swelling, or soreness. ? Pus-like discharge. ? A bad smell.  The tube is clogged and cannot be flushed.  The tube comes out. Summary  A gastrostomy tube, or G-tube, is a tube that is inserted through the abdomen into the stomach. The tube is used to give feedings and medicines when a person is unable to eat and drink enough on his or her own.  Check and clean the insertion site daily as told by the person's health care provider.  Flush the G-tube regularly to keep it from clogging. Flush it before and after feedings and as often as told by the person's health care provider.  Keep the area where the tube enters the skin clean and dry. This information is not intended to replace advice given to you by your health care provider. Make sure you discuss any questions you have with your health care provider. Document Revised: 10/28/2017  Document Reviewed: 01/10/2017 Elsevier Patient Education  [place]. Moderate Conscious Sedation, Adult Sedation is the use of medicines to promote relaxation and relieve discomfort and anxiety. Moderate conscious sedation is  a type of sedation. Under moderate conscious sedation, you are less alert than normal, but you are still able to respond to instructions, touch, or both. Moderate conscious sedation is used during short medical and dental procedures. It is milder than deep sedation, which is a type of sedation under which you cannot be easily woken up. It is also milder than general anesthesia, which is the use of medicines to make you unconscious. Moderate conscious sedation allows you to return to your regular activities sooner. Tell a health care provider about:  Any allergies you have.  All medicines you are taking, including vitamins, herbs, eye drops, creams, and over-the-counter medicines.  Use of steroids (by mouth or creams).  Any problems you or family members have had with sedatives and anesthetic medicines.  Any blood disorders you have.  Any surgeries you have had.  Any medical conditions you have, such as sleep apnea.  Whether you are pregnant or may be pregnant.  Any use of cigarettes, alcohol, marijuana, or street drugs. What are the risks? Generally, this is a safe procedure. However, problems may occur, including:  Getting too much medicine (oversedation).  Nausea.  Allergic reaction to medicines.  Trouble breathing. If this happens, a breathing tube may be used to help with breathing. It will be removed when you are awake and breathing on your own.  Heart trouble.  Lung trouble. What happens before the procedure? Staying hydrated Follow instructions from your health care provider about hydration, which may include:  Up to 2 hours before the procedure - you may continue to drink clear liquids, such as water, clear fruit juice, black coffee,  and plain tea. Eating and drinking restrictions Follow instructions from your health care provider about eating and drinking, which may include:  8 hours before the procedure - stop eating heavy meals or foods such as meat, fried foods, or fatty foods.  6 hours before the procedure - stop eating light meals or foods, such as toast or cereal.  6 hours before the procedure - stop drinking milk or drinks that contain milk.  2 hours before the procedure - stop drinking clear liquids. Medicine Ask your health care provider about:  Changing or stopping your regular medicines. This is especially important if you are taking diabetes medicines or blood thinners.  Taking medicines such as aspirin and ibuprofen. These medicines can thin your blood. Do not take these medicines before your procedure if your health care provider instructs you not to.  Tests and exams  You will have a physical exam.  You may have blood tests done to show: ? How well your kidneys and liver are working. ? How well your blood can clot. General instructions  Plan to have someone take you home from the hospital or clinic.  If you will be going home right after the procedure, plan to have someone with you for 24 hours. What happens during the procedure?  An IV tube will be inserted into one of your veins.  Medicine to help you relax (sedative) will be given through the IV tube.  The medical or dental procedure will be performed. What happens after the procedure?  Your blood pressure, heart rate, breathing rate, and blood oxygen level will be monitored often until the medicines you were given have worn off.  Do not drive for 24 hours. This information is not intended to replace advice given to you by your health care provider. Make sure you discuss any questions you have with your  health care provider. Document Revised: 10/28/2017 Document Reviewed: 03/06/2016 Elsevier Patient Education  2020 [employer].

## 2020-07-24 NOTE — Sedation Documentation (Addendum)
Gastrostomy tube procedure start time 1041

## 2020-07-24 NOTE — Telephone Encounter (Signed)
Scheduled appt per 8/23 sch msg - left message for patient sister with new appts for next wk

## 2020-07-24 NOTE — Telephone Encounter (Signed)
Appointments scheduled as requested by scheduling at [id number]. Per 8/24 msg

## 2020-07-24 NOTE — Progress Notes (Signed)
Discharge instructions reviewed with patient and family. Verbalized understanding. 

## 2020-07-24 NOTE — Procedures (Signed)
Interventional Radiology Procedure Note  Procedure: Placement of percutaneous 20F pull-through gastrostomy tube. Complications: None Recommendations: - NPO except for sips and chips remainder of today and overnight - Maintain G-tube to LWS until tomorrow morning  - May advance diet as tolerated and begin using tube tomorrow morning  Signed,   Keerthi Hazell S. Kalan Rinn, DO   

## 2020-07-24 NOTE — Procedures (Signed)
Interventional Radiology Procedure Note  Procedure: Placement of a right IJ approach single lumen PowerPort.  Tip is positioned at the superior cavoatrial junction and catheter is ready for immediate use.  Complications: None Recommendations:  - Ok to shower tomorrow - Do not submerge for 7 days - Routine line care   Signed,  Rayola Everhart S. Gidget Quizhpi, DO   

## 2020-07-25 ENCOUNTER — Inpatient Hospital Stay: Payer: Medicare HMO

## 2020-07-25 NOTE — Progress Notes (Unsigned)
Pharmacist Chemotherapy Monitoring - Initial Assessment    Anticipated start date: 07/30/20   Regimen:   Are orders appropriate based on the patients diagnosis, regimen, and cycle? Yes  Does the plan date match the patients scheduled date? Yes  Is the sequencing of drugs appropriate? Yes  Are the premedications appropriate for the patients regimen? Yes  Prior Authorization for treatment is: Pending o If applicable, is the correct biosimilar selected based on the patient's insurance? not applicable  Organ Function and Labs:  Are dose adjustments needed based on the patient's renal function, hepatic function, or hematologic function? No  Are appropriate labs ordered prior to the start of patient's treatment? Yes  Other organ system assessment, if indicated: cisplatin: baseline audiogram  The following baseline labs, if indicated, have been ordered: cisplatin: K, Mg  Dose Assessment:  Are the drug doses appropriate? Yes  Are the following correct: o Drug concentrations Yes o IV fluid compatible with drug Yes o Administration routes Yes o Timing of therapy Yes  If applicable, does the patient have documented access for treatment and/or plans for port-a-cath placement? no  If applicable, have lifetime cumulative doses been properly documented and assessed? not applicable Lifetime Dose Tracking  No doses have been documented on this patient for the following tracked chemicals: Doxorubicin, Epirubicin, Idarubicin, Daunorubicin, Mitoxantrone, Bleomycin, Oxaliplatin, Carboplatin, Liposomal Doxorubicin  o   Toxicity Monitoring/Prevention:  The patient has the following take home antiemetics prescribed: Prochlorperazine and Dexamethasone  The patient has the following take home medications prescribed: N/A  Medication allergies and previous infusion related reactions, if applicable, have been reviewed and addressed. Yes  The patient's current medication list has been assessed  for drug-drug interactions with their chemotherapy regimen. no significant drug-drug interactions were identified on review.  Order Review:  Are the treatment plan orders signed? Yes  Is the patient scheduled to see a provider prior to their treatment? No  I verify that I have reviewed each item in the above checklist and answered each question accordingly.  Charolette Bultman, Jacqlyn Larsen 07/25/2020 9:07 AM

## 2020-07-28 ENCOUNTER — Ambulatory Visit (HOSPITAL_COMMUNITY): Payer: Self-pay | Admitting: Dentistry

## 2020-07-28 ENCOUNTER — Encounter: Payer: Self-pay | Admitting: Oncology

## 2020-07-28 ENCOUNTER — Other Ambulatory Visit: Payer: Self-pay

## 2020-07-28 ENCOUNTER — Telehealth: Payer: Self-pay | Admitting: Nutrition

## 2020-07-28 ENCOUNTER — Telehealth: Payer: Self-pay | Admitting: *Deleted

## 2020-07-28 ENCOUNTER — Other Ambulatory Visit: Payer: Self-pay | Admitting: Oncology

## 2020-07-28 ENCOUNTER — Encounter (HOSPITAL_COMMUNITY): Payer: Self-pay | Admitting: Dentistry

## 2020-07-28 VITALS — BP 177/85 | HR 64 | Temp 98.3°F

## 2020-07-28 DIAGNOSIS — C119 Malignant neoplasm of nasopharynx, unspecified: Secondary | ICD-10-CM

## 2020-07-28 DIAGNOSIS — Z01818 Encounter for other preprocedural examination: Secondary | ICD-10-CM

## 2020-07-28 DIAGNOSIS — K08109 Complete loss of teeth, unspecified cause, unspecified class: Secondary | ICD-10-CM

## 2020-07-28 DIAGNOSIS — C111 Malignant neoplasm of posterior wall of nasopharynx: Secondary | ICD-10-CM | POA: Diagnosis not present

## 2020-07-28 DIAGNOSIS — K0820 Unspecified atrophy of edentulous alveolar ridge: Secondary | ICD-10-CM

## 2020-07-28 DIAGNOSIS — K08199 Complete loss of teeth due to other specified cause, unspecified class: Secondary | ICD-10-CM

## 2020-07-28 NOTE — Progress Notes (Signed)
Received voicemail from patient's sister Meredith Mody regarding schedule.  Called her back to attempt to schedule appointment with me after nutrition appointment. She states that day would not work for nutrition. Advised I would send message to [place] to have them reach out to her.  She also had questions regarding Radiation scheduling. Advised I would send message to have someone contact her.  She has my card for any additional financial questions or concerns.  Staff messages sent to nutrition, scheduling, and radiation teams.

## 2020-07-28 NOTE — Telephone Encounter (Signed)
I contacted patient's sister Meredith Mody to clarify nutrition schedule.  I explained that I would see patient during the time that he is scheduled on Thursday for other appointments.  I also assured her we would do our best to simplify the schedule for patient and family.  [name] was Patent attorney.

## 2020-07-28 NOTE — Patient Instructions (Signed)
PLAN: 1.  Use salt water rinses every 2 hours while awake to aid healing. 2.  Brush tongue daily. 3.  Patient is now edentulous.   4. Patient is cleared to start chemoradiation therapy on 07/31/2020. 5.  Patient to follow-up with a dentist of his choice for fabrication of upper and lower complete dentures 3 months after the last dose of chemoradiation therapy. 6.  Patient to return to dental medicine 1 month after the last dose of chemoradiation therapy. 7.  Patient to call problems arise before then.   Lenn Cal, DDS

## 2020-07-28 NOTE — Progress Notes (Signed)
POST OPERATIVE NOTE:  07/28/2020 Willie Kennedy [id number]  COVID 19 SCREENING: The patient does not symptoms concerning for COVID-19 infection (Including fever, chills, cough, or new SHORTNESS OF BREATH).    VITALS: BP (!) 177/85 (BP Location: Right Arm)   Pulse 64   Temp 98.3 F (36.8 C)   LABS:  Lab Results  Component Value Date   WBC 7.2 07/24/2020   HGB 11.9 (L) 07/24/2020   HCT 36.7 (L) 07/24/2020   MCV 94.8 07/24/2020   PLT 315 07/24/2020   BMET    Component Value Date/Time   NA 137 07/21/2020 1414   K 4.3 07/21/2020 1414   CL 102 07/21/2020 1414   CO2 26 07/21/2020 1414   GLUCOSE 97 07/21/2020 1414   BUN 22 07/21/2020 1414   CREATININE 0.90 07/21/2020 1414   CREATININE 1.00 06/27/2015 1222   CALCIUM 9.7 07/21/2020 1414   GFRNONAA >60 07/21/2020 1414   GFRAA >60 07/21/2020 1414    Lab Results  Component Value Date   INR 1.0 07/24/2020   INR 1.03 08/15/2015   INR 0.9 ratio 09/15/2010   No results found for: PTT   Willie Kennedy is status post extraction of remaining teeth with alveoloplasty in the operating room with general anesthesia on 07/17/2020.  SUBJECTIVE: Patient with minimal complaints of dental extraction sites.  Patient indicates that all sutures are gone.  Patient has neck pain from" whiplash from lawnmower accident ".  EXAM: There is no sign of oral infection, heme, or ooze.  There are no sutures that remain.  Patient is healing in by generalized primary closure.  Patient is healing in by secondary intention in the area of tooth numbers 22 and 27.  Patient is now edentulous.  ASSESSMENT: Post operative course is consistent with dental procedures performed in the operating room with general anesthesia Loss of teeth due to extraction Patient is now completely edentulous.  PLAN: 1.  Use salt water rinses every 2 hours while awake to aid healing. 2.  Brush tongue daily. 3.  Patient is now edentulous.   4. Patient is cleared to start  chemoradiation therapy on 07/31/2020. 5.  Patient to follow-up with a dentist of his choice for fabrication of upper and lower complete dentures 3 months after the last dose of chemoradiation therapy. 6.  Patient to return to dental medicine 1 month after the last dose of chemoradiation therapy. 7.  Patient to call problems arise before then.   Lenn Cal, DDS

## 2020-07-28 NOTE — Telephone Encounter (Signed)
Pt family called concerned pt is dehydrated and won't be able to proceed with treatment if still dehydrated. Dr.Shadad requested pt be seen in [facility]. Called infusion and pt was put on schedule for IVF @230pm  on 8/31. Called and made pt family aware of time and date.

## 2020-07-29 ENCOUNTER — Encounter: Payer: Self-pay | Admitting: Oncology

## 2020-07-29 ENCOUNTER — Emergency Department (HOSPITAL_COMMUNITY): Payer: Medicare HMO

## 2020-07-29 ENCOUNTER — Other Ambulatory Visit: Payer: Self-pay

## 2020-07-29 ENCOUNTER — Inpatient Hospital Stay: Payer: Medicare HMO

## 2020-07-29 ENCOUNTER — Encounter (HOSPITAL_COMMUNITY): Payer: Self-pay

## 2020-07-29 ENCOUNTER — Ambulatory Visit: Payer: Medicare HMO | Admitting: Radiation Oncology

## 2020-07-29 ENCOUNTER — Emergency Department (HOSPITAL_COMMUNITY)
Admission: EM | Admit: 2020-07-29 | Discharge: 2020-07-29 | Disposition: A | Discharge status: Home / Self Care | Payer: Medicare HMO | Attending: Emergency Medicine | Admitting: Emergency Medicine

## 2020-07-29 DIAGNOSIS — Z87891 Personal history of nicotine dependence: Secondary | ICD-10-CM | POA: Insufficient documentation

## 2020-07-29 DIAGNOSIS — Z85828 Personal history of other malignant neoplasm of skin: Secondary | ICD-10-CM | POA: Insufficient documentation

## 2020-07-29 DIAGNOSIS — W28XXXA Contact with powered lawn mower, initial encounter: Secondary | ICD-10-CM | POA: Insufficient documentation

## 2020-07-29 DIAGNOSIS — X58XXXA Exposure to other specified factors, initial encounter: Secondary | ICD-10-CM | POA: Insufficient documentation

## 2020-07-29 DIAGNOSIS — I2510 Atherosclerotic heart disease of native coronary artery without angina pectoris: Secondary | ICD-10-CM | POA: Diagnosis not present

## 2020-07-29 DIAGNOSIS — Y929 Unspecified place or not applicable: Secondary | ICD-10-CM | POA: Diagnosis not present

## 2020-07-29 DIAGNOSIS — S0990XA Unspecified injury of head, initial encounter: Secondary | ICD-10-CM | POA: Diagnosis present

## 2020-07-29 DIAGNOSIS — Z20822 Contact with and (suspected) exposure to covid-19: Secondary | ICD-10-CM | POA: Insufficient documentation

## 2020-07-29 DIAGNOSIS — Y939 Activity, unspecified: Secondary | ICD-10-CM | POA: Insufficient documentation

## 2020-07-29 DIAGNOSIS — R1084 Generalized abdominal pain: Secondary | ICD-10-CM | POA: Diagnosis not present

## 2020-07-29 DIAGNOSIS — S0219XA Other fracture of base of skull, initial encounter for closed fracture: Secondary | ICD-10-CM | POA: Insufficient documentation

## 2020-07-29 DIAGNOSIS — R930 Abnormal findings on diagnostic imaging of skull and head, not elsewhere classified: Secondary | ICD-10-CM | POA: Insufficient documentation

## 2020-07-29 DIAGNOSIS — Y999 Unspecified external cause status: Secondary | ICD-10-CM | POA: Diagnosis not present

## 2020-07-29 LAB — URINALYSIS, ROUTINE W REFLEX MICROSCOPIC
Bilirubin Urine: NEGATIVE
Glucose, UA: NEGATIVE mg/dL
Hgb urine dipstick: NEGATIVE
Ketones, ur: 20 mg/dL — AB
Leukocytes,Ua: NEGATIVE
Nitrite: NEGATIVE
Protein, ur: NEGATIVE mg/dL
Specific Gravity, Urine: 1.031 — ABNORMAL HIGH (ref 1.005–1.030)
pH: 6 (ref 5.0–8.0)

## 2020-07-29 LAB — COMPREHENSIVE METABOLIC PANEL
ALT: 18 U/L (ref 0–44)
AST: 22 U/L (ref 15–41)
Albumin: 3.8 g/dL (ref 3.5–5.0)
Alkaline Phosphatase: 75 U/L (ref 38–126)
Anion gap: 15 (ref 5–15)
BUN: 37 mg/dL — ABNORMAL HIGH (ref 8–23)
CO2: 23 mmol/L (ref 22–32)
Calcium: 8.7 mg/dL — ABNORMAL LOW (ref 8.9–10.3)
Chloride: 94 mmol/L — ABNORMAL LOW (ref 98–111)
Creatinine, Ser: 0.75 mg/dL (ref 0.61–1.24)
GFR calc Af Amer: >60 mL/min (ref >60)
GFR calc non Af Amer: >60 mL/min (ref >60)
Glucose, Bld: 101 mg/dL — ABNORMAL HIGH (ref 70–99)
Potassium: 3.9 mmol/L (ref 3.5–5.1)
Sodium: 132 mmol/L — ABNORMAL LOW (ref 135–145)
Total Bilirubin: 1.4 mg/dL — ABNORMAL HIGH (ref 0.3–1.2)
Total Protein: 7.4 g/dL (ref 6.5–8.1)

## 2020-07-29 LAB — CBC
HCT: 39 % (ref 39.0–52.0)
Hemoglobin: 13.1 g/dL (ref 13.0–17.0)
MCH: 31.5 pg (ref 26.0–34.0)
MCHC: 33.6 g/dL (ref 30.0–36.0)
MCV: 93.8 fL (ref 80.0–100.0)
Platelets: 395 10*3/uL (ref 150–400)
RBC: 4.16 MIL/uL — ABNORMAL LOW (ref 4.22–5.81)
RDW: 14.5 % (ref 11.5–15.5)
WBC: 11.8 10*3/uL — ABNORMAL HIGH (ref 4.0–10.5)
nRBC: 0 % (ref 0.0–0.2)

## 2020-07-29 LAB — SARS CORONAVIRUS 2 BY RT PCR (HOSPITAL ORDER, PERFORMED IN ~~LOC~~ HOSPITAL LAB): SARS Coronavirus 2: NEGATIVE

## 2020-07-29 LAB — LIPASE, BLOOD: Lipase: 22 U/L (ref 11–51)

## 2020-07-29 MED ORDER — ONDANSETRON 4 MG PO TBDP: 4.0000 mg | ORAL_TABLET | Freq: Three times a day (TID) | ORAL | 0 refills | Status: DC | PRN

## 2020-07-29 MED ORDER — SODIUM CHLORIDE 0.9 % IV BOLUS
1000.0000 mL | Freq: Once | INTRAVENOUS | Status: AC
  Administered 2020-07-29: 1000 mL via INTRAVENOUS

## 2020-07-29 MED ORDER — ONDANSETRON HCL 4 MG/2ML IJ SOLN
4.0000 mg | Freq: Once | INTRAMUSCULAR | Status: AC
  Administered 2020-07-29: 4 mg via INTRAVENOUS
  Filled 2020-07-29: qty 2

## 2020-07-29 MED ORDER — IOHEXOL 300 MG/ML  SOLN
100.0000 mL | Freq: Once | INTRAMUSCULAR | Status: AC | PRN
  Administered 2020-07-29: 100 mL via INTRAVENOUS

## 2020-07-29 MED ORDER — GADOBUTROL 1 MMOL/ML IV SOLN
6.0000 mL | Freq: Once | INTRAVENOUS | Status: AC | PRN
  Administered 2020-07-29: 6 mL via INTRAVENOUS

## 2020-07-29 MED ORDER — HEPARIN SOD (PORK) LOCK FLUSH 100 UNIT/ML IV SOLN
500.0000 [IU] | Freq: Once | INTRAVENOUS | Status: AC
  Administered 2020-07-29: 500 [IU]
  Filled 2020-07-29: qty 5

## 2020-07-29 MED ORDER — SODIUM CHLORIDE (PF) 0.9 % IJ SOLN
INTRAMUSCULAR | Status: AC
  Filled 2020-07-29: qty 50

## 2020-07-29 MED ORDER — HYDROMORPHONE HCL 1 MG/ML IJ SOLN
1.0000 mg | Freq: Once | INTRAMUSCULAR | Status: AC
  Administered 2020-07-29: 1 mg via INTRAVENOUS
  Filled 2020-07-29: qty 1

## 2020-07-29 MED ORDER — MORPHINE SULFATE (PF) 4 MG/ML IV SOLN
4.0000 mg | Freq: Once | INTRAVENOUS | Status: AC
  Administered 2020-07-29: 4 mg via INTRAVENOUS
  Filled 2020-07-29: qty 1

## 2020-07-29 NOTE — ED Notes (Signed)
Pt transported to CT ?

## 2020-07-29 NOTE — Discharge Instructions (Signed)
As discussed, your evaluation today has been largely reassuring aside from the demonstration of a new fracture.  But, it is important that you monitor your condition carefully, and do not hesitate to return to the ED if you develop new, or concerning changes in your condition.  Otherwise, please follow-up with your physicians for appropriate ongoing care.

## 2020-07-29 NOTE — ED Notes (Signed)
Pt transported to MRI 

## 2020-07-29 NOTE — ED Triage Notes (Addendum)
Patient c/o posterior neck and right lateral neck pain x 5 days. Patient was suppose to start radiation today, but has been vomiting and having abdominal pain x 3 days. Patient has a feeding tube and patient's wife states that she has given him Compazine with some relief approx 1 hour ago. Patient's wife called the oncologist and patient was instructed to come to the ED for IV fluids.

## 2020-07-29 NOTE — ED Provider Notes (Signed)
Emergency Department Provider Note   I have reviewed the triage vital signs and the nursing notes.   HISTORY  Chief Complaint Neck Pain, Emesis, and Abdominal Pain   HPI Willie Kennedy is a 69 y.o. 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  Patient has not been able to eat in the past 3 days.  He does have a G-tube in place which is having some drainage and causing him some localized discomfort.  Pain around the tube has worsened but he denies pain with flushing or using the tube.  He has not had fever but describes hot and cold chills at times.  He has been prescribed Compazine to take for nausea which was crushed up in given through his G-tube with some mild relief in symptoms.  He has not been taking his pain medicines due to vomiting in the past several days.  The patient's sister-in-law at bedside called the oncology office and they tried to set him up for IV fluids but symptoms worsened and so presents to the ED. Patient denies diarrhea.   Patient is also having some continued and worsening right lateral neck pain for the past 5 days.  He had a lawnmower accident which developed a whiplash type mechanism.  He initially sought care and had a plain film of the cervical spine which showed no acute findings.  Denies any numbness or weakness in the extremities.  No new injuries.   Past Medical History:  Diagnosis Date  . Biceps tendon rupture    h/o Right biceps tendon rupture  . CAD (coronary artery disease)    a. ETT myoview (10/11) with EF 52%, reversible inferior perfusion defect, 1 mm inferolateral ST depression with exercise, 7"16" and stopped due to dyspnea and leg weakness -> LHC (10/11), EF 55%, 40% pRCA, 40% mRCA, 40% prox to mid CFX. medical treatment.  . Cancer ([place])    skin cancer behind left ear   .  Dizziness    with bending over   . Hard of hearing    more per right ear  . Headache    with Kennedy light   . History of bronchitis   . History of hiatal hernia   . Hyperlipidemia   . Impaired glucose tolerance   . Numbness and tingling in left arm    if holds arm upward too [place] pt states his primary MD is aware  . Osteoarthritis   . Peripheral vascular disease ([place]) 2005   a. s/p left fem-pop Dr.Lawson in 2005.  Marland Kitchen Shortness of breath dyspnea    walking distances or climbing stairs  . Tinnitus     Patient Active Problem List   Diagnosis Date Noted  . Carcinoma of posterior wall of nasopharynx ([place]) 07/18/2020  . Nasopharyngeal cancer ([place]) 07/08/2020  . Neuropathy 07/11/2019  . DJD (degenerative joint disease) 07/11/2019  . Decreased ROM of left elbow 10/03/2017  . Pain in left wrist 09/26/2017  . Biceps rupture, distal, left, initial encounter 06/27/2017  . Neuropathic pain 05/09/2017  . Complicated migraine 03/00/9233  . Restless leg syndrome 02/23/2017  . Transient alteration of awareness 08/06/2016  . Hyperreflexia 08/06/2016  . Muscle cramps 08/06/2016  . Left leg pain 02/06/2016  . Impacted ear wax 07/04/2015  . Hearing loss of both ears 07/04/2015  . Ventral hernia 05/16/2015  . Nocturnal leg  cramps 05/16/2015  . Bruit 09/20/2014  . Chest pain 09/20/2014  . Aftercare following surgery of the circulatory system, [place] 08/07/2014  . Right elbow pain 01/10/2014  . Right leg pain 06/18/2013  . Goals of care, counseling/discussion 05/04/2013  . Impaired glucose tolerance 05/04/2013  . CAD, NATIVE VESSEL 10/01/2010  . Nonspecific abnormal electrocardiogram (ECG) (EKG) 09/04/2010  . Hyperlipidemia 08/27/2010  . OSTEOARTHRITIS, HANDS, BILATERAL 08/27/2010  . Atherosclerosis of native arteries of extremity with intermittent claudication ([place]) 09/06/2008    Past Surgical History:  Procedure Laterality Date  . ds/p left femoral artery occlusion  2005   s/p left  fem-pop bypass- Dr. Kellie Simmering  . HERNIA REPAIR    . IR GASTROSTOMY TUBE MOD SED  07/24/2020  . IR IMAGING GUIDED PORT INSERTION  07/24/2020  . MULTIPLE EXTRACTIONS WITH ALVEOLOPLASTY N/A 07/17/2020   Procedure: Extraction of tooth #'s 0,09,23,30,QTM 31 with alveoloplasty;  Surgeon: Lenn Cal, DDS;  Location: [facility];  Service: Oral Surgery;  Laterality: N/A;  . MYRINGOTOMY WITH TUBE PLACEMENT Right 06/26/2020   Procedure: MYRINGOTOMY WITH TUBE PLACEMENT RIGHT  EAR;  Surgeon: Melida Quitter, MD;  Location: [facility];  Service: ENT;  Laterality: Right;  . NASAL ENDOSCOPY N/A 06/26/2020   Procedure: NASAL ENDOSCOPY W/NASOPHARYNGEAL BIOPSY;  Surgeon: Melida Quitter, MD;  Location: [facility];  Service: ENT;  Laterality: N/A;  . PR VEIN BYPASS GRAFT,AORTO-FEM-POP    . s/p right bicep tendon rupture repair    . VENTRAL HERNIA REPAIR N/A 08/22/2015   Procedure: OPEN VENTRAL HERNIA REPAIR WITH MESH;  Surgeon: Jackolyn Confer, MD;  Location: [facility];  Service: General;  Laterality: N/A;    Allergies Patient has no known allergies.  Family History  Problem Relation Age of Onset  . Cervical cancer Mother   . Cancer Mother        cervical  . Lung cancer Father   . Cancer Father        lung    Social History Social History   Tobacco Use  . Smoking status: Former Smoker    Packs/day: 7.00    Years: 40.00    Pack years: 280.00    Types: Cigarettes    Quit date: 11/29/1994    Years since quitting: 25.6  . Smokeless tobacco: Never Used  Vaping Use  . Vaping Use: Never used  Substance Use Topics  . Alcohol use: No  . Drug use: No    Review of Systems  Constitutional: No fever. Reports feeling hot/cold chills.  Eyes: No visual changes. ENT: No sore throat. Cardiovascular: Denies chest pain. Respiratory: Denies shortness of breath. Gastrointestinal: Positive abdominal pain. Positive nausea and vomiting.  No diarrhea.  No constipation. Genitourinary:  Negative for dysuria. Musculoskeletal: Negative for back pain. Positive right lateral neck pain.  Skin: Negative for rash. Neurological: Negative for headaches, focal weakness or numbness.  10-point ROS otherwise negative.  ____________________________________________   PHYSICAL EXAM:  VITAL SIGNS: ED Triage Vitals  Enc Vitals Group     BP 07/29/20 1126 (!) 160/104     Pulse Rate 07/29/20 1126 78     Resp 07/29/20 1126 18     Temp 07/29/20 1126 98.7 F (37.1 C)     Temp Source 07/29/20 1126 Oral     SpO2 07/29/20 1126 100 %     Weight 07/29/20 1126 122 lb (55.3 kg)     Height 07/29/20 1126 5\' 5"  (1.651 m)   Constitutional: Alert and oriented. No acute  distress but frequent waves of nausea and pain with movement especially in the neck.  Eyes: Conjunctivae are normal.  Head: Atraumatic. Nose: No congestion/rhinnorhea. Mouth/Throat: Mucous membranes are dry.  Neck: No stridor. No cervical spine tenderness to palpation. Cardiovascular: Normal rate, regular rhythm. Good peripheral circulation. Grossly normal heart sounds.   Respiratory: Normal respiratory effort.  No retractions. Lungs CTAB. Gastrointestinal: Soft with mild diffuse tenderness. G-tube in place. No surrounding cellulitis. Mild drainage on the gauze surrounding the G-tube without blood. No clear pus or superficial abscess. No distention.  Musculoskeletal:  No gross deformities of extremities. Neurologic:  Normal speech and language.  Skin:  Skin is warm, dry and intact. No rash noted.  ____________________________________________   LABS (all labs ordered are listed, but only abnormal results are displayed)  Labs Reviewed  COMPREHENSIVE METABOLIC PANEL - Abnormal; Notable for the following components:      Result Value   Sodium 132 (*)    Chloride 94 (*)    Glucose, Bld 101 (*)    BUN 37 (*)    Calcium 8.7 (*)    Total Bilirubin 1.4 (*)    All other components within normal limits  CBC - Abnormal; Notable  for the following components:   WBC 11.8 (*)    RBC 4.16 (*)    All other components within normal limits  URINALYSIS, ROUTINE W REFLEX MICROSCOPIC - Abnormal; Notable for the following components:   Specific Gravity, Urine 1.031 (*)    Ketones, ur 20 (*)    All other components within normal limits  SARS CORONAVIRUS 2 BY RT PCR (HOSPITAL ORDER, [facility])  LIPASE, BLOOD   ____________________________________________  RADIOLOGY  CT Cervical Spine Wo Contrast  Result Date: 07/29/2020 CLINICAL DATA:  Neck trauma. Additional history provided: Patient reports posterior neck and right lateral neck pain for 5 days since since injuring neck while mowing yard. EXAM: CT CERVICAL SPINE WITHOUT CONTRAST TECHNIQUE: Multidetector CT imaging of the cervical spine was performed without intravenous contrast. Multiplanar CT image reconstructions were also generated. COMPARISON:  PET-CT 07/07/2020, neck CT 06/04/2020. FINDINGS: Alignment: No significant spondylolisthesis. Skull base and vertebrae: The basion-dental and atlanto-dental intervals are maintained.No evidence of acute fracture to the cervical spine. Soft tissues and spinal canal: No prevertebral fluid or swelling. No visible canal hematoma. Disc levels: Cervical spondylosis with multilevel disc space narrowing, disc bulges and uncovertebral hypertrophy. No high-grade bony spinal canal stenosis. Upper chest: Emphysema and biapical pleuroparenchymal scarring. Partially visualized right chest infusion port catheter. Other: New from the prior neck CT of 06/04/2020, there is a mildly displaced acute fracture of the right occipital calvarium which may extend to the foramen magnum (for instance as seen on series 7, images 18-23) (series 9, image 19). This fracture may also extend to involve the right jugular foramen. A known necrotic mass centered within the posterior right nasopharynx and upper right neck is inadequately reassessed on  this noncontrast examination. However, there has been progressive destructive osseous erosion of the right occipital condyle, petrous apex and right aspect of the clivus. These results were called by telephone at the time of interpretation on 07/29/2020 at 3:08 pm to provider Shiquan Mathieu , who verbally acknowledged these results. IMPRESSION: Mildly displaced acute fracture of the right occipital calvarium. This fracture may extend to the foramen magnum and may also extend to involve the right jugular foramen. A known necrotic mass centered within the posterior right nasopharynx and upper right neck is inadequately reassessed on  this noncontrast examination. However, there has been progressive destructive osseous erosion of the right occipital condyle, right petrous apex and right aspect of the clivus as compared to the neck CT of 06/04/2020. Consider contrast-enhanced brain MRI to assess for injury to the right sigmoid sinus/jugular vein and to assess for involvement of the these vascular structures by the right nasopharyngeal mass. No evidence of acute fracture to the cervical spine. Cervical spondylosis as described. Electronically Signed   By: Kellie Simmering DO   On: 07/29/2020 15:09   MR Brain W and Wo Contrast  Result Date: 07/29/2020 CLINICAL DATA:  Head trauma EXAM: MRI HEAD WITHOUT AND WITH CONTRAST TECHNIQUE: Multiplanar, multiecho pulse sequences of the brain and surrounding structures were obtained without and with intravenous contrast. CONTRAST:  23mL GADAVIST GADOBUTROL 1 MMOL/ML IV SOLN COMPARISON:  06/04/2020 CT neck. FINDINGS: Brain: Diffusion-weighted hyperintensity involving the anterior pons and midbrain without focal ADC correlate is likely artifactual. No acute infarct. No intracranial hemorrhage. No midline shift, ventriculomegaly or extra-axial fluid collection. Mild chronic microvascular ischemic changes. Sequela of remote insults involving the left parietal lobe and vermis. No abnormal  enhancement involving the brain parenchyma. Vascular: Major intracranial flow voids are preserved. Skull and upper cervical spine: Right occipital condyle erosions and the cervical spine are better evaluated on recent CT neck. No focal calvarial lesions. Sinuses/Orbits: No acute orbital finding. Pneumatized paranasal sinuses. Right mastoid effusion. Other: Partially imaged necrotic right neck mass along the nasopharynx, underlying the occipital condyle is fully characterized on recent CT neck. Neoplastic involvement of the right skull base and erosive changes involving the right occipital bone are also better evaluated on recent CT. IMPRESSION: No acute intracranial pathology. Partially imaged tumor along the right nasopharynx with erosive changes involving the right skull base, better characterized on recent CT neck. No abnormal enhancement involving the brain parenchyma. Electronically Signed   By: Primitivo Gauze M.D.   On: 07/29/2020 17:38   CT ABDOMEN PELVIS W CONTRAST  Result Date: 07/29/2020 CLINICAL DATA:  69 year old male with history of abdominal pain. Feeding tube placed on 07/24/2020. EXAM: CT ABDOMEN AND PELVIS WITH CONTRAST TECHNIQUE: Multidetector CT imaging of the abdomen and pelvis was performed using the standard protocol following bolus administration of intravenous contrast. CONTRAST:  167mL OMNIPAQUE IOHEXOL 300 MG/ML  SOLN COMPARISON:  No priors. FINDINGS: Lower chest: Paraseptal emphysema noted in the lower lungs bilaterally. Atherosclerotic calcifications in the descending thoracic aorta and right coronary artery. Hepatobiliary: Subcentimeter low-attenuation lesion in segment 4A of the liver, too small to characterize, but statistically likely to represent a tiny cyst. No other larger more suspicious hepatic lesions. No intra or extrahepatic biliary ductal dilatation. Gallbladder is normal in appearance. Pancreas: No pancreatic mass. No pancreatic ductal dilatation. No pancreatic or  peripancreatic fluid collections or inflammatory changes. Spleen: Unremarkable. Adrenals/Urinary Tract: Multiple low-attenuation lesions in both kidneys compatible with simple cysts, largest of which measures 2 cm in the lower pole of the right kidney. Other subcentimeter low-attenuation lesions in both kidneys, too small to definitively characterize, but statistically likely to represent tiny cysts. Bilateral adrenal glands are normal in appearance. No hydroureteronephrosis. Urinary bladder is normal in appearance. Stomach/Bowel: Percutaneous gastrostomy tube with retention tip in the distal body of the stomach. No pathologic dilatation of small bowel or colon. Normal appendix. Vascular/Lymphatic: Aortic atherosclerosis, without evidence of aneurysm or dissection in the abdominal or pelvic vasculature. No lymphadenopathy noted in the abdomen or pelvis. Reproductive: Prostate gland and seminal vesicles are unremarkable in appearance. Other:  No significant volume of ascites. Trace volume of pneumoperitoneum most evident adjacent to the liver. Musculoskeletal: There are no aggressive appearing lytic or blastic lesions noted in the visualized portions of the skeleton. IMPRESSION: 1. Trace volume of pneumoperitoneum, within normal limits given the recent placement of the percutaneous gastrostomy tube. No other acute findings are noted in the abdomen or pelvis. 2. Paraseptal emphysema. 3. Aortic atherosclerosis, in addition to at least right coronary artery disease. Assessment for potential risk factor modification, dietary therapy or pharmacologic therapy may be warranted, if clinically indicated. 4. Additional incidental findings, as above. Electronically Signed   By: Vinnie Langton M.D.   On: 07/29/2020 14:56    ____________________________________________   PROCEDURES  Procedure(s) performed:   Procedures  None  ____________________________________________   INITIAL IMPRESSION / ASSESSMENT AND PLAN  / ED COURSE  Pertinent labs & imaging results that were available during my care of the patient were reviewed by me and considered in my medical decision making (see chart for details).   Patient presents to the emergency department with vomiting and abdominal pain.  He has some pain around the G-tube site but other than some mild discharge on the gauze the tube site looks overall well.  No rebound or guarding on abdominal exam.  He is not hypotensive or tachycardic.  Afebrile here.  He is not on chemotherapy and due to start radiation for the first time this afternoon.  We will give IV fluids along with pain and nausea medication.  Plan for CT imaging of the cervical spine given recent injury and worsening pain but no neuro deficits to prompt emergency MRI at this time.  Plain film reviewed from last visit which was unremarkable.  I also plan on obtaining a CT scan of the abdomen and pelvis to rule out post procedure complication with his G-tube.  Low suspicion clinically for bowel obstruction with large BM yesterday.   CT imaging of the abdomen and pelvis reviewed with no acute findings to explain patient's symptoms.  Patient's CT cervical spine shows a fracture in the base of the skull as described above.  Discussed this with the radiologist and have ordered MRI brain with and without contrast to assess the fracture near the jugular foramen.  Discussed with neurosurgery on-call and no collar indicated at this time.  Care transferred to Dr. Vanita Panda pending MRI. ____________________________________________  FINAL CLINICAL IMPRESSION(S) / ED DIAGNOSES  Final diagnoses:  Closed fracture of posterior cranial fossa, initial encounter ([place])  Generalized abdominal pain     MEDICATIONS GIVEN DURING THIS VISIT:  Medications  ondansetron (ZOFRAN) injection 4 mg (4 mg Intravenous Given 07/29/20 1234)  sodium chloride 0.9 % bolus 1,000 mL (0 mLs Intravenous Stopped 07/29/20 1444)  morphine 4 MG/ML  injection 4 mg (4 mg Intravenous Given 07/29/20 1234)  iohexol (OMNIPAQUE) 300 MG/ML solution 100 mL (100 mLs Intravenous Contrast Given 07/29/20 1353)  HYDROmorphone (DILAUDID) injection 1 mg (1 mg Intravenous Given 07/29/20 1521)  gadobutrol (GADAVIST) 1 MMOL/ML injection 6 mL (6 mLs Intravenous Contrast Given 07/29/20 1658)  heparin lock flush 100 unit/mL (500 Units Intracatheter Given 07/29/20 1937)     NEW OUTPATIENT MEDICATIONS STARTED DURING THIS VISIT:  Discharge Medication List as of 07/29/2020  7:47 PM    START taking these medications   Details  ondansetron (ZOFRAN ODT) 4 MG disintegrating tablet Take 1 tablet (4 mg total) by mouth every 8 (eight) hours as needed for nausea or vomiting., Starting Tue 07/29/2020, Normal  Note:  This document was prepared using Dragon voice recognition software and may include unintentional dictation errors.  Nanda Quinton, MD, [facility]    Fender Herder, Wonda [place], MD 07/30/20 [phone]

## 2020-07-29 NOTE — ED Provider Notes (Signed)
Care of the patient assumed from Dr. Laverta Baltimore. After the patient's MRI results were available I discussed them with the patient and his family member.  I have also discussed findings with our neurosurgeon on-call. With otherwise reassuring CT scans of his abdomen, pelvis, reassuring labs, and after the patient had substantial improvement in his discomfort after placement of a soft collar, the patient was discharged again with his family member to proceed to radiation and chemotherapy tomorrow, to follow-up with neurosurgery in the clinic.  This is oncologist tomorrow as well.  Home health services facilitated, antiemetics provided.   Carmin Muskrat, MD 07/29/20 [phone]

## 2020-07-29 NOTE — ED Notes (Signed)
Pt ambulatory to bathroom

## 2020-07-30 ENCOUNTER — Inpatient Hospital Stay: Payer: Medicare HMO

## 2020-07-30 ENCOUNTER — Ambulatory Visit
Admission: RE | Admit: 2020-07-30 | Discharge: 2020-07-30 | Disposition: A | Discharge status: Home / Self Care | Payer: Medicare HMO | Source: Ambulatory Visit | Attending: Radiation Oncology | Admitting: Radiation Oncology

## 2020-07-30 ENCOUNTER — Other Ambulatory Visit: Payer: Self-pay | Admitting: Oncology

## 2020-07-30 ENCOUNTER — Other Ambulatory Visit: Payer: Self-pay

## 2020-07-30 ENCOUNTER — Inpatient Hospital Stay: Payer: Medicare HMO | Attending: Oncology

## 2020-07-30 VITALS — BP 138/71 | HR 64 | Temp 98.8°F | Resp 24

## 2020-07-30 DIAGNOSIS — Z5111 Encounter for antineoplastic chemotherapy: Secondary | ICD-10-CM | POA: Insufficient documentation

## 2020-07-30 DIAGNOSIS — R110 Nausea: Secondary | ICD-10-CM | POA: Insufficient documentation

## 2020-07-30 DIAGNOSIS — C119 Malignant neoplasm of nasopharynx, unspecified: Secondary | ICD-10-CM

## 2020-07-30 DIAGNOSIS — R197 Diarrhea, unspecified: Secondary | ICD-10-CM | POA: Diagnosis not present

## 2020-07-30 DIAGNOSIS — E871 Hypo-osmolality and hyponatremia: Secondary | ICD-10-CM | POA: Insufficient documentation

## 2020-07-30 DIAGNOSIS — C111 Malignant neoplasm of posterior wall of nasopharynx: Secondary | ICD-10-CM | POA: Diagnosis present

## 2020-07-30 DIAGNOSIS — R634 Abnormal weight loss: Secondary | ICD-10-CM | POA: Insufficient documentation

## 2020-07-30 DIAGNOSIS — C7951 Secondary malignant neoplasm of bone: Secondary | ICD-10-CM | POA: Diagnosis not present

## 2020-07-30 DIAGNOSIS — E860 Dehydration: Secondary | ICD-10-CM | POA: Diagnosis not present

## 2020-07-30 DIAGNOSIS — G893 Neoplasm related pain (acute) (chronic): Secondary | ICD-10-CM | POA: Diagnosis not present

## 2020-07-30 LAB — CBC WITH DIFFERENTIAL (CANCER CENTER ONLY)
Abs Immature Granulocytes: 0.03 10*3/uL (ref 0.00–0.07)
Basophils Absolute: 0 10*3/uL (ref 0.0–0.1)
Basophils Relative: 0 %
Eosinophils Absolute: 0.1 10*3/uL (ref 0.0–0.5)
Eosinophils Relative: 0 %
HCT: 37.1 % — ABNORMAL LOW (ref 39.0–52.0)
Hemoglobin: 12.5 g/dL — ABNORMAL LOW (ref 13.0–17.0)
Immature Granulocytes: 0 %
Lymphocytes Relative: 12 %
Lymphs Abs: 1.3 10*3/uL (ref 0.7–4.0)
MCH: 30.8 pg (ref 26.0–34.0)
MCHC: 33.7 g/dL (ref 30.0–36.0)
MCV: 91.4 fL (ref 80.0–100.0)
Monocytes Absolute: 0.8 10*3/uL (ref 0.1–1.0)
Monocytes Relative: 7 %
Neutro Abs: 9.2 10*3/uL — ABNORMAL HIGH (ref 1.7–7.7)
Neutrophils Relative %: 81 %
Platelet Count: 347 10*3/uL (ref 150–400)
RBC: 4.06 MIL/uL — ABNORMAL LOW (ref 4.22–5.81)
RDW: 14.3 % (ref 11.5–15.5)
WBC Count: 11.4 10*3/uL — ABNORMAL HIGH (ref 4.0–10.5)
nRBC: 0 % (ref 0.0–0.2)

## 2020-07-30 LAB — CMP (CANCER CENTER ONLY)
ALT: 11 U/L (ref 0–44)
AST: 17 U/L (ref 15–41)
Albumin: 3.3 g/dL — ABNORMAL LOW (ref 3.5–5.0)
Alkaline Phosphatase: 81 U/L (ref 38–126)
Anion gap: 9 (ref 5–15)
BUN: 27 mg/dL — ABNORMAL HIGH (ref 8–23)
CO2: 26 mmol/L (ref 22–32)
Calcium: 9.3 mg/dL (ref 8.9–10.3)
Chloride: 99 mmol/L (ref 98–111)
Creatinine: 0.74 mg/dL (ref 0.61–1.24)
GFR, Est AFR Am: >60 mL/min (ref >60)
GFR, Estimated: >60 mL/min (ref >60)
Glucose, Bld: 86 mg/dL (ref 70–99)
Potassium: 3.8 mmol/L (ref 3.5–5.1)
Sodium: 134 mmol/L — ABNORMAL LOW (ref 135–145)
Total Bilirubin: 1.3 mg/dL — ABNORMAL HIGH (ref 0.3–1.2)
Total Protein: 6.9 g/dL (ref 6.5–8.1)

## 2020-07-30 MED ORDER — MORPHINE SULFATE (PF) 2 MG/ML IV SOLN: 2.0000 mg | Freq: Once | INTRAVENOUS | Status: DC

## 2020-07-30 MED ORDER — SODIUM CHLORIDE 0.9% FLUSH
10.0000 mL | INTRAVENOUS | Status: AC | PRN
  Administered 2020-07-30: 10 mL
  Filled 2020-07-30: qty 10

## 2020-07-30 MED ORDER — SODIUM CHLORIDE 0.9% FLUSH
10.0000 mL | Freq: Once | INTRAVENOUS | Status: AC | PRN
  Filled 2020-07-30: qty 10

## 2020-07-30 MED ORDER — SODIUM CHLORIDE 0.9 % IV SOLN
150.0000 mg | Freq: Once | INTRAVENOUS | Status: AC
  Administered 2020-07-30: 150 mg via INTRAVENOUS
  Filled 2020-07-30: qty 150

## 2020-07-30 MED ORDER — HEPARIN SOD (PORK) LOCK FLUSH 100 UNIT/ML IV SOLN
500.0000 [IU] | Freq: Once | INTRAVENOUS | Status: AC | PRN
  Administered 2020-07-30: 500 [IU]
  Filled 2020-07-30: qty 5

## 2020-07-30 MED ORDER — SODIUM CHLORIDE 0.9 % IV SOLN
10.0000 mg | Freq: Once | INTRAVENOUS | Status: AC
  Administered 2020-07-30: 10 mg via INTRAVENOUS
  Filled 2020-07-30: qty 10

## 2020-07-30 MED ORDER — SODIUM CHLORIDE 0.9 % IV SOLN
Freq: Once | INTRAVENOUS | Status: AC
  Filled 2020-07-30: qty 250

## 2020-07-30 MED ORDER — MORPHINE SULFATE (PF) 2 MG/ML IV SOLN
INTRAVENOUS | Status: AC
  Filled 2020-07-30: qty 1

## 2020-07-30 MED ORDER — SODIUM CHLORIDE 0.9% FLUSH
10.0000 mL | Freq: Once | INTRAVENOUS | Status: AC | PRN
  Administered 2020-07-30: 10 mL
  Filled 2020-07-30: qty 10

## 2020-07-30 MED ORDER — FENTANYL 25 MCG/HR TD PT72
1.0000 | MEDICATED_PATCH | TRANSDERMAL | 0 refills | Status: DC
Start: 2020-07-30 — End: 2020-08-01

## 2020-07-30 MED ORDER — SODIUM CHLORIDE 0.9 % IV SOLN
30.0000 mg/m2 | Freq: Once | INTRAVENOUS | Status: AC
  Administered 2020-07-30: 50 mg via INTRAVENOUS
  Filled 2020-07-30: qty 50

## 2020-07-30 MED ORDER — PALONOSETRON HCL INJECTION 0.25 MG/5ML
0.2500 mg | Freq: Once | INTRAVENOUS | Status: AC
  Administered 2020-07-30: 0.25 mg via INTRAVENOUS

## 2020-07-30 MED ORDER — HEPARIN SOD (PORK) LOCK FLUSH 100 UNIT/ML IV SOLN
250.0000 [IU] | Freq: Once | INTRAVENOUS | Status: AC | PRN
  Filled 2020-07-30: qty 5

## 2020-07-30 MED ORDER — MORPHINE SULFATE (PF) 2 MG/ML IV SOLN
2.0000 mg | Freq: Once | INTRAVENOUS | Status: AC
  Administered 2020-07-30: 2 mg via INTRAVENOUS

## 2020-07-30 MED ORDER — MORPHINE SULFATE ER 30 MG PO TBCR: 30.0000 mg | EXTENDED_RELEASE_TABLET | Freq: Two times a day (BID) | ORAL | 0 refills | Status: DC

## 2020-07-30 MED ORDER — PALONOSETRON HCL INJECTION 0.25 MG/5ML
INTRAVENOUS | Status: AC
  Filled 2020-07-30: qty 5

## 2020-07-30 MED ORDER — POTASSIUM CHLORIDE 2 MEQ/ML IV SOLN
Freq: Once | INTRAVENOUS | Status: AC
  Filled 2020-07-30: qty 10

## 2020-07-30 MED FILL — ONDANSETRON ODT 4 MG TABLET: 4 | 6 days supply | Qty: 20 | Fill #0

## 2020-07-30 MED FILL — MORPHINE SULF ER 30 MG TAB: 30 | 30 days supply | Qty: 60 | Fill #0

## 2020-07-30 MED FILL — FENTANYL 25 MCG/HR PT72: 25 | 15 days supply | Qty: 5 | Fill #0

## 2020-07-30 NOTE — Patient Instructions (Signed)
[facility] Discharge Instructions for Patients Receiving Chemotherapy  Today you received the following chemotherapy agents:  Cisplatin  To help prevent nausea and vomiting after your treatment, we encourage you to take your nausea medication as ordered per MD.    If you develop nausea and vomiting that is not controlled by your nausea medication, call the clinic.   BELOW ARE SYMPTOMS THAT SHOULD BE REPORTED IMMEDIATELY:  *FEVER GREATER THAN 100.5 F  *CHILLS WITH OR WITHOUT FEVER  NAUSEA AND VOMITING THAT IS NOT CONTROLLED WITH YOUR NAUSEA MEDICATION  *UNUSUAL SHORTNESS OF BREATH  *UNUSUAL BRUISING OR BLEEDING  TENDERNESS IN MOUTH AND THROAT WITH OR WITHOUT PRESENCE OF ULCERS  *URINARY PROBLEMS  *BOWEL PROBLEMS  UNUSUAL RASH Items with * indicate a potential emergency and should be followed up as soon as possible.  Feel free to call the clinic should you have any questions or concerns. The clinic phone number is [phone].  Please show the CHEMO ALERT CARD at check-in to the Emergency Department and triage nurse.   

## 2020-07-30 NOTE — Progress Notes (Signed)
Oncology Nurse Navigator Documentation  To provide support, encouragement and care continuity, met with Willie Kennedy and his brother-in-law for his initial RT.    I reviewed the 2-step treatment process, answered questions.   [name] completed treatment today. He was in the ED yesterday and is experiencing pain related to a new fracture found and his cancer.   I reviewed the registration/arrival procedure for subsequent treatments.  I encouraged them to call me with questions/concerns as tmts proceed.   Harlow Asa RN, BSN, OCN Head & Neck Oncology Nurse [place] at [facility] Phone # [phone]  Fax # [phone]

## 2020-07-30 NOTE — Progress Notes (Unsigned)
Dr. Alen Blew notified via secure chat regarding patient crying in pain d/t pain to right side of head and neck.  Order received for pt to have Morphine 2 mg IV now and to repeat in two hours if needed.

## 2020-07-30 NOTE — Progress Notes (Unsigned)
1345-Morphine 2 mg IV given for pain to right side of head 7/10 per patient. 1400-Pt asleep.

## 2020-07-31 ENCOUNTER — Telehealth: Payer: Self-pay

## 2020-07-31 ENCOUNTER — Other Ambulatory Visit: Payer: Self-pay

## 2020-07-31 ENCOUNTER — Other Ambulatory Visit: Payer: Self-pay | Admitting: Medical

## 2020-07-31 ENCOUNTER — Encounter (INDEPENDENT_AMBULATORY_CARE_PROVIDER_SITE_OTHER): Payer: Medicare HMO

## 2020-07-31 ENCOUNTER — Inpatient Hospital Stay (HOSPITAL_BASED_OUTPATIENT_CLINIC_OR_DEPARTMENT_OTHER): Payer: Medicare HMO | Admitting: Medical

## 2020-07-31 ENCOUNTER — Encounter (INDEPENDENT_AMBULATORY_CARE_PROVIDER_SITE_OTHER): Payer: Self-pay

## 2020-07-31 ENCOUNTER — Ambulatory Visit: Payer: Medicare HMO | Admitting: Physical Therapy

## 2020-07-31 ENCOUNTER — Ambulatory Visit: Payer: Medicare HMO

## 2020-07-31 ENCOUNTER — Inpatient Hospital Stay: Payer: Medicare HMO | Admitting: Nutrition

## 2020-07-31 ENCOUNTER — Ambulatory Visit
Admission: RE | Admit: 2020-07-31 | Discharge: 2020-07-31 | Disposition: A | Discharge status: Home / Self Care | Payer: Medicare HMO | Source: Ambulatory Visit | Attending: Radiation Oncology | Admitting: Radiation Oncology

## 2020-07-31 ENCOUNTER — Ambulatory Visit (INDEPENDENT_AMBULATORY_CARE_PROVIDER_SITE_OTHER): Payer: Medicare HMO | Admitting: Vascular Surgery

## 2020-07-31 VITALS — BP 162/80 | HR 60 | Temp 97.9°F | Resp 18

## 2020-07-31 DIAGNOSIS — R112 Nausea with vomiting, unspecified: Secondary | ICD-10-CM

## 2020-07-31 DIAGNOSIS — G893 Neoplasm related pain (acute) (chronic): Secondary | ICD-10-CM | POA: Diagnosis not present

## 2020-07-31 DIAGNOSIS — C111 Malignant neoplasm of posterior wall of nasopharynx: Secondary | ICD-10-CM | POA: Diagnosis not present

## 2020-07-31 DIAGNOSIS — C119 Malignant neoplasm of nasopharynx, unspecified: Secondary | ICD-10-CM

## 2020-07-31 DIAGNOSIS — Z5111 Encounter for antineoplastic chemotherapy: Secondary | ICD-10-CM | POA: Diagnosis not present

## 2020-07-31 MED ORDER — MORPHINE SULFATE (PF) 2 MG/ML IV SOLN
2.0000 mg | Freq: Once | INTRAVENOUS | Status: AC
  Administered 2020-07-31: 2 mg via INTRAVENOUS

## 2020-07-31 MED ORDER — PROCHLORPERAZINE EDISYLATE 10 MG/2ML IJ SOLN
10.0000 mg | Freq: Once | INTRAMUSCULAR | Status: AC
  Administered 2020-07-31: 10 mg via INTRAVENOUS

## 2020-07-31 MED ORDER — SODIUM CHLORIDE 0.9% FLUSH
10.0000 mL | Freq: Once | INTRAVENOUS | Status: AC | PRN
  Administered 2020-07-31: 10 mL
  Filled 2020-07-31: qty 10

## 2020-07-31 MED ORDER — HEPARIN SOD (PORK) LOCK FLUSH 100 UNIT/ML IV SOLN
500.0000 [IU] | Freq: Once | INTRAVENOUS | Status: AC | PRN
  Administered 2020-07-31: 500 [IU]
  Filled 2020-07-31: qty 5

## 2020-07-31 MED ORDER — MORPHINE SULFATE (PF) 2 MG/ML IV SOLN
INTRAVENOUS | Status: AC
  Filled 2020-07-31: qty 1

## 2020-07-31 MED ORDER — SODIUM CHLORIDE 0.9 % IV SOLN
Freq: Once | INTRAVENOUS | Status: AC
  Filled 2020-07-31: qty 250

## 2020-07-31 MED ORDER — PROCHLORPERAZINE EDISYLATE 10 MG/2ML IJ SOLN
INTRAMUSCULAR | Status: AC
  Filled 2020-07-31: qty 2

## 2020-07-31 NOTE — Progress Notes (Addendum)
Oncology Nurse Navigator Documentation  Mr. Jha presented to [facility] today with his brother Liliane Channel. He reports severe pain to his right neck, head area. He was prescribed and started a 25 mcg Fentanyl patch yesterday, but has not taken any breakthrough pain medicine today. He also vomited >4 times while in the Radiation Nursing clinic. He is unable to sit for any length of time and needs to lay on his left side, which he tells me helps the pain. He has a feeding tube in place and is instilling free water, but has been unable to use any nutritional supplements via the tube due to vomiting. Dory Peru RD was also present while he was in the clinic and was able to assess him. He is having difficulty communicating at times due to the severe pain and it is difficult to understand exactly what he is able to manage at home. I called and spoke to Sandi Mealy PA in Symptom Management and he was able to see him at this time, so I escorted Mr. Maack and his brother upstairs to the Symptom Management clinic.   I then called his sister-in-law Meredith Mody to update her on the situation and discussed the need for him to take nausea medicine consistently and breakthrough pain medicine as needed.   Mr. Statzer was unable to participate in the Head and Neck clinic today, and will be rescheduled for 08/14/20. He also was unable to tolerate radiation today. We will try to treat him this afternoon if his symptoms improve for him to tolerate radiation.   Addendum 2:05 pm. Mr. Ackert was able to complete his radiation today after receiving IVF, antiemetics, and pain medicine in the symptom management clinic.   Harlow Asa RN, BSN, OCN Head & Neck Oncology Nurse [place] at [facility] Phone # [phone]  Fax # [phone]

## 2020-07-31 NOTE — Patient Instructions (Signed)
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
## 2020-07-31 NOTE — Telephone Encounter (Signed)
Received Pt today escorted up in a wheelchair by Clearnce Sorrel from [place] c/o nausea, vomiting, Pt has peg tube. Pt placed in recliner laying flat on his left side, stated feels better with the pain. Pt states he has 9/10 pain to the Right side of his head. Port accessed IV fluids ordered, antinausea and pain medication given per Sandi Mealy PA orders. Pt tolerating fluids well.

## 2020-08-01 ENCOUNTER — Other Ambulatory Visit: Payer: Self-pay

## 2020-08-01 ENCOUNTER — Inpatient Hospital Stay: Payer: Medicare HMO

## 2020-08-01 ENCOUNTER — Ambulatory Visit
Admission: RE | Admit: 2020-08-01 | Discharge: 2020-08-01 | Disposition: A | Discharge status: Home / Self Care | Payer: Medicare HMO | Source: Ambulatory Visit | Attending: Radiation Oncology | Admitting: Radiation Oncology

## 2020-08-01 ENCOUNTER — Telehealth: Payer: Self-pay | Admitting: Oncology

## 2020-08-01 ENCOUNTER — Encounter: Payer: Self-pay | Admitting: Oncology

## 2020-08-01 ENCOUNTER — Other Ambulatory Visit: Payer: Self-pay | Admitting: *Deleted

## 2020-08-01 VITALS — BP 160/78 | HR 60 | Temp 98.0°F | Resp 18

## 2020-08-01 DIAGNOSIS — M792 Neuralgia and neuritis, unspecified: Secondary | ICD-10-CM

## 2020-08-01 DIAGNOSIS — C111 Malignant neoplasm of posterior wall of nasopharynx: Secondary | ICD-10-CM | POA: Diagnosis not present

## 2020-08-01 DIAGNOSIS — C119 Malignant neoplasm of nasopharynx, unspecified: Secondary | ICD-10-CM

## 2020-08-01 DIAGNOSIS — Z5111 Encounter for antineoplastic chemotherapy: Secondary | ICD-10-CM | POA: Diagnosis not present

## 2020-08-01 MED ORDER — SODIUM CHLORIDE 0.9 % IV SOLN
Freq: Once | INTRAVENOUS | Status: AC
  Filled 2020-08-01: qty 250

## 2020-08-01 MED ORDER — OXYCODONE HCL 5 MG/5ML PO SOLN: 5.0000 mg | ORAL | 0 refills | Status: DC | PRN

## 2020-08-01 MED ORDER — PROMETHAZINE HCL 6.25 MG/5ML PO SYRP: ORAL_SOLUTION | ORAL | 2 refills | Status: DC

## 2020-08-01 MED ORDER — SODIUM CHLORIDE 0.9% FLUSH
10.0000 mL | Freq: Once | INTRAVENOUS | Status: AC | PRN
  Administered 2020-08-01: 10 mL
  Filled 2020-08-01: qty 10

## 2020-08-01 MED ORDER — HEPARIN SOD (PORK) LOCK FLUSH 100 UNIT/ML IV SOLN
500.0000 [IU] | Freq: Once | INTRAVENOUS | Status: AC | PRN
  Administered 2020-08-01: 500 [IU]
  Filled 2020-08-01: qty 5

## 2020-08-01 MED ORDER — FENTANYL 50 MCG/HR TD PT72: 1.0000 | MEDICATED_PATCH | TRANSDERMAL | 0 refills | Status: DC

## 2020-08-01 MED FILL — fentaNYL 50 MCG/HR PT72: 50 | 30 days supply | Qty: 10 | Fill #0

## 2020-08-01 MED FILL — oxyCODONE HCL 5 MG/5ML SOLN: 5 | 8 days supply | Qty: 240 | Fill #0

## 2020-08-01 MED FILL — PROMETHAZINE 6.25 MG/5 ML S: 6.25 | 6 days supply | Qty: 240 | Fill #0

## 2020-08-01 NOTE — Telephone Encounter (Signed)
Scheduled appt per 9/3 sch msg. Called and spoke with pt family member. Advised of gap between MD and infusion. Confirmed appts

## 2020-08-01 NOTE — Progress Notes (Signed)
Patient's sister Meredith Mody brought proof of income for one-time $1000 [employer] account [employer].

## 2020-08-01 NOTE — Progress Notes (Signed)
Order in place for walker for pt. Zack at [facility] has been called. Will reach out to family.

## 2020-08-01 NOTE — Progress Notes (Signed)
Sent message to MD Shadad/MD Farrel Gordon regarding pt/family's desire to inc frequency of IVF admin appts to weekly/around treatment days in order to help with malnutrition/dehydration issues.

## 2020-08-01 NOTE — Patient Instructions (Signed)

## 2020-08-05 ENCOUNTER — Inpatient Hospital Stay: Payer: Medicare HMO

## 2020-08-05 ENCOUNTER — Other Ambulatory Visit: Payer: Self-pay | Admitting: Radiation Oncology

## 2020-08-05 ENCOUNTER — Other Ambulatory Visit: Payer: Self-pay

## 2020-08-05 ENCOUNTER — Ambulatory Visit
Admission: RE | Admit: 2020-08-05 | Discharge: 2020-08-05 | Disposition: A | Discharge status: Home / Self Care | Payer: Medicare HMO | Source: Ambulatory Visit | Attending: Radiation Oncology | Admitting: Radiation Oncology

## 2020-08-05 ENCOUNTER — Inpatient Hospital Stay (HOSPITAL_BASED_OUTPATIENT_CLINIC_OR_DEPARTMENT_OTHER): Payer: Medicare HMO | Admitting: Oncology

## 2020-08-05 ENCOUNTER — Encounter: Payer: Self-pay | Admitting: Oncology

## 2020-08-05 VITALS — BP 126/88 | HR 68 | Temp 97.9°F | Resp 18 | Ht 65.0 in

## 2020-08-05 DIAGNOSIS — C119 Malignant neoplasm of nasopharynx, unspecified: Secondary | ICD-10-CM

## 2020-08-05 DIAGNOSIS — G893 Neoplasm related pain (acute) (chronic): Secondary | ICD-10-CM

## 2020-08-05 DIAGNOSIS — C111 Malignant neoplasm of posterior wall of nasopharynx: Secondary | ICD-10-CM

## 2020-08-05 DIAGNOSIS — Z5111 Encounter for antineoplastic chemotherapy: Secondary | ICD-10-CM | POA: Diagnosis not present

## 2020-08-05 LAB — CMP (CANCER CENTER ONLY)
ALT: 21 U/L (ref 0–44)
AST: 19 U/L (ref 15–41)
Albumin: 3.1 g/dL — ABNORMAL LOW (ref 3.5–5.0)
Alkaline Phosphatase: 102 U/L (ref 38–126)
Anion gap: 9 (ref 5–15)
BUN: 18 mg/dL (ref 8–23)
CO2: 28 mmol/L (ref 22–32)
Calcium: 9 mg/dL (ref 8.9–10.3)
Chloride: 93 mmol/L — ABNORMAL LOW (ref 98–111)
Creatinine: 0.74 mg/dL (ref 0.61–1.24)
GFR, Est AFR Am: >60 mL/min (ref >60)
GFR, Estimated: >60 mL/min (ref >60)
Glucose, Bld: 113 mg/dL — ABNORMAL HIGH (ref 70–99)
Potassium: 4.3 mmol/L (ref 3.5–5.1)
Sodium: 130 mmol/L — ABNORMAL LOW (ref 135–145)
Total Bilirubin: 0.7 mg/dL (ref 0.3–1.2)
Total Protein: 7 g/dL (ref 6.5–8.1)

## 2020-08-05 LAB — CBC WITH DIFFERENTIAL (CANCER CENTER ONLY)
Abs Immature Granulocytes: 0.04 10*3/uL (ref 0.00–0.07)
Basophils Absolute: 0 10*3/uL (ref 0.0–0.1)
Basophils Relative: 0 %
Eosinophils Absolute: 0 10*3/uL (ref 0.0–0.5)
Eosinophils Relative: 0 %
HCT: 41.8 % (ref 39.0–52.0)
Hemoglobin: 14.3 g/dL (ref 13.0–17.0)
Immature Granulocytes: 0 %
Lymphocytes Relative: 9 %
Lymphs Abs: 1.1 10*3/uL (ref 0.7–4.0)
MCH: 31.3 pg (ref 26.0–34.0)
MCHC: 34.2 g/dL (ref 30.0–36.0)
MCV: 91.5 fL (ref 80.0–100.0)
Monocytes Absolute: 0.8 10*3/uL (ref 0.1–1.0)
Monocytes Relative: 7 %
Neutro Abs: 9.9 10*3/uL — ABNORMAL HIGH (ref 1.7–7.7)
Neutrophils Relative %: 84 %
Platelet Count: 338 10*3/uL (ref 150–400)
RBC: 4.57 MIL/uL (ref 4.22–5.81)
RDW: 13.9 % (ref 11.5–15.5)
WBC Count: 11.9 10*3/uL — ABNORMAL HIGH (ref 4.0–10.5)
nRBC: 0 % (ref 0.0–0.2)

## 2020-08-05 MED ORDER — DEXAMETHASONE 0.5 MG/5ML PO SOLN: ORAL | 2 refills | Status: DC

## 2020-08-05 MED ORDER — SODIUM CHLORIDE 0.9 % IV SOLN
Freq: Once | INTRAVENOUS | Status: AC
  Filled 2020-08-05: qty 250

## 2020-08-05 MED ORDER — ONDANSETRON HCL 4 MG/2ML IJ SOLN
8.0000 mg | Freq: Once | INTRAMUSCULAR | Status: AC
  Administered 2020-08-05: 8 mg via INTRAVENOUS

## 2020-08-05 MED ORDER — LANSOPRAZOLE 15 MG PO TBDD: DELAYED_RELEASE_TABLET | ORAL | 1 refills | Status: AC

## 2020-08-05 MED ORDER — HEPARIN SOD (PORK) LOCK FLUSH 100 UNIT/ML IV SOLN
500.0000 [IU] | Freq: Once | INTRAVENOUS | Status: AC | PRN
  Administered 2020-08-05: 500 [IU]
  Filled 2020-08-05: qty 5

## 2020-08-05 MED ORDER — SODIUM CHLORIDE 0.9% FLUSH
3.0000 mL | Freq: Once | INTRAVENOUS | Status: DC | PRN
  Filled 2020-08-05: qty 10

## 2020-08-05 MED ORDER — ONDANSETRON HCL 4 MG/2ML IJ SOLN
INTRAMUSCULAR | Status: AC
  Filled 2020-08-05: qty 4

## 2020-08-05 MED ORDER — ALTEPLASE 2 MG IJ SOLR
2.0000 mg | Freq: Once | INTRAMUSCULAR | Status: DC | PRN
  Filled 2020-08-05: qty 2

## 2020-08-05 MED ORDER — MORPHINE SULFATE (PF) 2 MG/ML IV SOLN
2.0000 mg | INTRAVENOUS | Status: DC | PRN
  Administered 2020-08-05: 2 mg via INTRAVENOUS

## 2020-08-05 MED ORDER — HEPARIN SOD (PORK) LOCK FLUSH 100 UNIT/ML IV SOLN
250.0000 [IU] | Freq: Once | INTRAVENOUS | Status: DC | PRN
  Filled 2020-08-05: qty 5

## 2020-08-05 MED ORDER — MORPHINE SULFATE (PF) 2 MG/ML IV SOLN
INTRAVENOUS | Status: AC
Start: 2020-08-05 — End: ?
  Filled 2020-08-05: qty 1

## 2020-08-05 MED ORDER — OMEPRAZOLE 2 MG/ML ORAL SUSPENSION: 20.0000 mg | Freq: Every day | ORAL | 1 refills | Status: DC

## 2020-08-05 MED ORDER — SODIUM CHLORIDE 0.9% FLUSH
10.0000 mL | Freq: Once | INTRAVENOUS | Status: AC | PRN
  Administered 2020-08-05: 10 mL
  Filled 2020-08-05: qty 10

## 2020-08-05 MED ORDER — SONAFINE EX EMUL
1.0000 "application " | Freq: Two times a day (BID) | CUTANEOUS | Status: DC
  Administered 2020-08-05: 1 via TOPICAL

## 2020-08-05 MED FILL — DEXAMETHASONE 0.5 MG/5 ML L: 0.5 | 6 days supply | Qty: 500 | Fill #0

## 2020-08-05 NOTE — Patient Instructions (Signed)
Dehydration, Adult Dehydration is condition in which there is not enough water or other fluids in the body. This happens when a person loses more fluids than he or she takes in. Important body parts cannot work right without the right amount of fluids. Any loss of fluids from the body can cause dehydration. Dehydration can be mild, worse, or very bad. It should be treated right away to keep it from getting very bad. What are the causes? This condition may be caused by:  Conditions that cause loss of water or other fluids, such as: ? Watery poop (diarrhea). ? Vomiting. ? Sweating a lot. ? Peeing (urinating) a lot.  Not drinking enough fluids, especially when you: ? Are ill. ? Are doing things that take a lot of energy to do.  Other illnesses and conditions, such as fever or infection.  Certain medicines, such as medicines that take extra fluid out of the body (diuretics).  Lack of safe drinking water.  Not being able to get enough water and food. What increases the risk? The following factors may make you more likely to develop this condition:  Having a long-term (chronic) illness that has not been treated the right way, such as: ? Diabetes. ? Heart disease. ? Kidney disease.  Being 65 years of age or older.  Having a disability.  Living in a place that is high above the ground or sea (high in altitude). The thinner, dried air causes more fluid loss.  Doing exercises that put stress on your body for a long time. What are the signs or symptoms? Symptoms of dehydration depend on how bad it is. Mild or worse dehydration  Thirst.  Dry lips or dry mouth.  Feeling dizzy or light-headed, especially when you stand up from sitting.  Muscle cramps.  Your body making: ? Dark pee (urine). Pee may be the color of tea. ? Less pee than normal. ? Less tears than normal.  Headache. Very bad dehydration  Changes in skin. Skin may: ? Be cold to the touch (clammy). ? Be blotchy  or pale. ? Not go back to normal right after you lightly pinch it and let it go.  Little or no tears, pee, or sweat.  Changes in vital signs, such as: ? Fast breathing. ? Low blood pressure. ? Weak pulse. ? Pulse that is more than 100 beats a minute when you are sitting still.  Other changes, such as: ? Feeling very thirsty. ? Eyes that look hollow (sunken). ? Cold hands and feet. ? Being mixed up (confused). ? Being very tired (lethargic) or having trouble waking from sleep. ? Short-term weight loss. ? Loss of consciousness. How is this treated? Treatment for this condition depends on how bad it is. Treatment should start right away. Do not wait until your condition gets very bad. Very bad dehydration is an emergency. You will need to go to a hospital.  Mild or worse dehydration can be treated at home. You may be asked to: ? Drink more fluids. ? Drink an oral rehydration solution (ORS). This drink helps get the right amounts of fluids and salts and minerals in the blood (electrolytes).  Very bad dehydration can be treated: ? With fluids through an IV tube. ? By getting normal levels of salts and minerals in your blood. This is often done by giving salts and minerals through a tube. The tube is passed through your nose and into your stomach. ? By treating the root cause. Follow these instructions at   home: Oral rehydration solution If told by your doctor, drink an ORS:  Make an ORS. Use instructions on the package.  Start by drinking small amounts, about  cup (120 mL) every 5-10 minutes.  Slowly drink more until you have had the amount that your doctor said to have. Eating and drinking         Drink enough clear fluid to keep your pee pale yellow. If you were told to drink an ORS, finish the ORS first. Then, start slowly drinking other clear fluids. Drink fluids such as: ? Water. Do not drink only water. Doing that can make the salt (sodium) level in your body get too  low. ? Water from ice chips you suck on. ? Fruit juice that you have added water to (diluted). ? Low-calorie sports drinks.  Eat foods that have the right amounts of salts and minerals, such as: ? Bananas. ? Oranges. ? Potatoes. ? Tomatoes. ? Spinach.  Do not drink alcohol.  Avoid: ? Drinks that have a lot of sugar. These include:  High-calorie sports drinks.  Fruit juice that you did not add water to.  Soda.  Caffeine. ? Foods that are greasy or have a lot of fat or sugar. General instructions  Take over-the-counter and prescription medicines only as told by your doctor.  Do not take salt tablets. Doing that can make the salt level in your body get too high.  Return to your normal activities as told by your doctor. Ask your doctor what activities are safe for you.  Keep all follow-up visits as told by your doctor. This is important. Contact a doctor if:  You have pain in your belly (abdomen) and the pain: ? Gets worse. ? Stays in one place.  You have a rash.  You have a stiff neck.  You get angry or annoyed (irritable) more easily than normal.  You are more tired or have a harder time waking than normal.  You feel: ? Weak or dizzy. ? Very thirsty. Get help right away if you have:  Any symptoms of very bad dehydration.  Symptoms of vomiting, such as: ? You cannot eat or drink without vomiting. ? Your vomiting gets worse or does not go away. ? Your vomit has blood or green stuff in it.  Symptoms that get worse with treatment.  A fever.  A very bad headache.  Problems with peeing or pooping (having a bowel movement), such as: ? Watery poop that gets worse or does not go away. ? Blood in your poop (stool). This may cause poop to look black and tarry. ? Not peeing in 6-8 hours. ? Peeing only a small amount of very dark pee in 6-8 hours.  Trouble breathing. These symptoms may be an emergency. Do not wait to see if the symptoms will go away. Get  medical help right away. Call your local emergency services (911 in the [place] Do not drive yourself to the hospital. Summary  Dehydration is a condition in which there is not enough water or other fluids in the body. This happens when a person loses more fluids than he or she takes in.  Treatment for this condition depends on how bad it is. Treatment should be started right away. Do not wait until your condition gets very bad.  Drink enough clear fluid to keep your pee pale yellow. If you were told to drink an oral rehydration solution (ORS), finish the ORS first. Then, start slowly drinking other clear fluids.  Take over-the-counter and prescription medicines only as told by your doctor.  Get help right away if you have any symptoms of very bad dehydration. This information is not intended to replace advice given to you by your health care provider. Make sure you discuss any questions you have with your health care provider. Document Revised: 06/28/2019 Document Reviewed: 06/28/2019 Elsevier Patient Education  [place].  Vomiting, Adult Vomiting occurs when stomach contents are thrown up and out of the mouth. Many people notice nausea before vomiting. Vomiting can make you feel weak and cause you to become dehydrated. Dehydration can make you feel tired and thirsty, cause you to have a dry mouth, and decrease how often you urinate. Older adults and people who have other diseases or a weak body defense system (immune system) are at higher risk for dehydration. It is important to treat vomiting as told by your health care provider. Follow these instructions at home:  Eating and drinking     Follow these recommendations as told by your health care provider:  Take an oral rehydration solution (ORS). This is a drink that is sold at pharmacies and retail stores.  Eat bland, easy-to-digest foods in small amounts as you are able. These foods include bananas, applesauce, rice, lean  meats, toast, and crackers.  Drink clear fluids slowly and in small amounts as you are able. Clear fluids include water, ice chips, low-calorie sports drinks, and fruit juice that has water added (diluted fruit juice).  Avoid drinking fluids that contain a lot of sugar or caffeine, such as energy drinks, sports drinks, and soda.  Avoid alcohol.  Avoid spicy or fatty foods.  General instructions  Wash your hands often using soap and water. If soap and water are not available, use hand sanitizer. Make sure that everyone in your household washes their hands frequently.  Take over-the-counter and prescription medicines only as told by your health care provider.  Rest at home while you recover.  Watch your condition for any changes.  Keep all follow-up visits as told by your health care provider. This is important. Contact a health care provider if:  Your vomiting gets worse.  You have new symptoms.  You have a fever.  You cannot drink fluids without vomiting.  You feel light-headed or dizzy.  You have a headache.  You have muscle cramps.  You have a rash.  You have pain while urinating. Get help right away if:  You have pain in your chest, neck, arm, or jaw.  You feel extremely weak or you faint.  You have persistent vomiting.  You have vomit that is bright red or looks like black coffee grounds.  You have stools that are bloody or black, or stools that look like tar.  You have a severe headache, a stiff neck, or both.  You have severe pain, cramping, or bloating in your abdomen.  You have trouble breathing or you are breathing very quickly.  Your heart is beating very quickly.  Your skin feels cold and clammy.  You feel confused.  You have signs of dehydration, such as: ? Dark urine, very little urine, or no urine. ? Cracked lips. ? Dry mouth. ? Sunken eyes. ? Sleepiness. ? Weakness. These symptoms may represent a serious problem that is an  emergency. Do not wait to see if the symptoms will go away. Get medical help right away. Call your local emergency services (911 in the [place] Do not drive yourself to the hospital. Summary  Vomiting occurs when stomach contents are thrown up and out of the mouth. Vomiting can cause you to become dehydrated. Older adults and people who have other diseases or a weak immune system are at higher risk for dehydration.  It is important to treat vomiting as told by your health care provider. Follow your health care provider's instructions about eating and drinking.  Wash your hands often using soap and water. If soap and water are not available, use hand sanitizer. Make sure that everyone in your household washes their hands frequently.  Watch your condition for any changes and for signs of dehydration.  Keep all follow-up visits as told by your health care provider. This is important. This information is not intended to replace advice given to you by your health care provider. Make sure you discuss any questions you have with your health care provider. Document Revised: 05/04/2019 Document Reviewed: 04/25/2018 Elsevier Patient Education  [place].

## 2020-08-05 NOTE — Progress Notes (Signed)
Pt here for patient teaching.    Pt given Radiation and You booklet, Managing Acute Radiation Side Effects for Head and Neck Cancer handout, skin care instructions and Sonafine.    Reviewed areas of pertinence such as fatigue, hair loss, mouth changes, skin changes, throat changes, earaches and taste changes .   Pt able to give teach back of to pat skin, use unscented/gentle soap and drink plenty of water,apply Sonafine bid, avoid applying anything to skin within 4 hours of treatment and to use an electric razor if they must shave.   Pt needs reinforcement and verbalizes understanding of information given and will contact nursing with any questions or concerns.    [email or web address]/treatmentinformation/whattoexpect/index

## 2020-08-05 NOTE — Progress Notes (Signed)
Hematology and Oncology Follow Up Visit  Willie Kennedy [id number] 10/27/1951 69 y.o. 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  He was found to have tumor invading into the occipital bone without any adenopathy.   Prior Therapy:  He is status post nasopharyngeal biopsy and right myringotomy completed by Dr. Redmond Kennedy on June 04, 2020.   Current therapy:  Radiation therapy with weekly cisplatin chemotherapy started on July 30, 2020.  This is his 2nd week of treatment.  Interim History: Willie Kennedy returns today for a follow-up.  Since the last visit, he has started definitive therapy with radiation and cisplatin with no specific complications related to therapy.  Continues to be quite debilitated because of the tumor he is experiencing severe pain at the base of the skull.  He has been started on fentanyl patch which has been increased up to 50 mcg in the last 48 hours.  He also has been using breakthrough oxycodone at least twice a day.  He has been receiving adequate nutrition but still quite dehydrated at times.  He does report some motion sickness associated with his treatment.   .    Medications: I have reviewed the patient's current medications.  Current Outpatient Medications  Medication Sig Dispense Refill  . atorvastatin (LIPITOR) 20 MG tablet Take 20 mg by mouth at bedtime.     Marland Kitchen dexamethasone (DECADRON) 2 MG tablet Take 2 tablets daily, starting today.  Taper to 1 tablet daily on 8/26.  Taper to half a tablet daily on 9/2.  Take through 9/7.  Take with food. (Patient taking differently: Take 2 mg by mouth See admin instructions. Take 4 mg 07/21/20, 3 mg on 8/24 ,Take 2 mg 07/23/20,  Take 1mg  the 07/24/20 then on the 07/25/20 take 1 mg  on as needed) 16 tablet 0  . fentaNYL (DURAGESIC) 50 MCG/HR Place 1 patch onto the skin every 3 (three)  days. 10 patch 0  . gabapentin (NEURONTIN) 300 MG capsule Take 2 capsules in AM, 3 capsules in evening (Patient taking differently: Take 900 mg by mouth 3 (three) times daily. ) 450 capsule 3  . lidocaine-prilocaine (EMLA) cream Apply 1 application topically as needed. 30 g 0  . nitroGLYCERIN (NITROSTAT) 0.4 MG SL tablet Place 1 tablet (0.4 mg total) under the tongue every 5 (five) minutes as needed. Place 1 tablet under tongue for chest pain -- not to exceed 3 in 15 minutes (Patient not taking: Reported on 07/31/2020) 100 tablet 11  . nortriptyline (PAMELOR) 10 MG capsule Take 1 capsule every night for 1 week, then increase to 2 capsules every night (Patient taking differently: Take 10 mg by mouth at bedtime. ) 60 capsule 11  . ondansetron (ZOFRAN ODT) 4 MG disintegrating tablet Take 1 tablet (4 mg total) by mouth every 8 (eight) hours as needed for nausea or vomiting. 20 tablet 0  . oxyCODONE (ROXICODONE) 5 MG/5ML solution Place 5 mLs (5 mg total) into feeding tube every 4 (four) hours as needed for severe pain. 240 mL 0  . prochlorperazine (COMPAZINE) 10 MG tablet Take 1 tablet (10 mg total) by mouth every 6 (six) hours as needed for nausea or vomiting. (Patient not taking: Reported on 07/31/2020) 30 tablet 0  . promethazine (PHENERGAN) 6.25 MG/5ML syrup 6.25 to 12.5 mg Q 6 hours VT prn nausea 240 mL 2  . rOPINIRole (  REQUIP) 0.25 MG tablet TAKE 1 TABLET BY MOUTH 2 HOURS BEFORE BEDTIME (Patient taking differently: Take 0.25 mg by mouth at bedtime. ) 90 tablet 3  . tiZANidine (ZANAFLEX) 4 MG tablet Take 1 tablet (4 mg total) by mouth at bedtime. 30 tablet 11  . traMADol (ULTRAM) 50 MG tablet TAKE 1 TABLET BY MOUTH EVERY NIGHT (Patient taking differently: Take 50 mg by mouth 3 (three) times daily. ) 90 tablet 3   No current facility-administered medications for this visit.   Facility-Administered Medications Ordered in Other Visits  Medication Dose Route Frequency Provider Last Rate Last Admin  .  heparin lock flush 100 unit/mL  250 Units Intracatheter Once PRN Wyatt Portela, MD      . sodium chloride flush (NS) 0.9 % injection 10 mL  10 mL Intracatheter Once PRN Wyatt Portela, MD      . sodium chloride flush (NS) 0.9 % injection 10 mL  10 mL Intracatheter PRN Wyatt Portela, MD   10 mL at 07/30/20 1520     Allergies: No Known Allergies   Physical Exam:  Blood pressure 126/88, pulse 68, temperature 97.9 F (36.6 C), temperature source Tympanic, resp. rate 18, height 5\' 5"  (1.651 m), SpO2 97 %.   ECOG: 1   General appearance:  Uncomfortable  appearing with mild distress. Head: Normocephalic without any trauma Oropharynx: Mucous membranes are moist and pink without any thrush or ulcers. Eyes: Pupils are equal and round reactive to light. Lymph nodes: No cervical, supraclavicular, inguinal or axillary lymphadenopathy.   Heart:regular rate and rhythm.  S1 and S2 without leg edema. Lung: Clear without any rhonchi or wheezes.  No dullness to percussion. Abdomin: Soft, nontender, nondistended with good bowel sounds.  No hepatosplenomegaly. Musculoskeletal: No joint deformity or effusion.  Full range of motion noted. Neurological: No deficits noted on motor, sensory and deep tendon reflex exam. Skin: No petechial rash or dryness.  Appeared moist.      Lab Results: Lab Results  Component Value Date   WBC 11.9 (H) 08/05/2020   HGB 14.3 08/05/2020   HCT 41.8 08/05/2020   MCV 91.5 08/05/2020   PLT 338 08/05/2020     Chemistry      Component Value Date/Time   NA 134 (L) 07/30/2020 0754   K 3.8 07/30/2020 0754   CL 99 07/30/2020 0754   CO2 26 07/30/2020 0754   BUN 27 (H) 07/30/2020 0754   CREATININE 0.74 07/30/2020 0754   CREATININE 1.00 06/27/2015 1222      Component Value Date/Time   CALCIUM 9.3 07/30/2020 0754   ALKPHOS 81 07/30/2020 0754   AST 17 07/30/2020 0754   ALT 11 07/30/2020 0754   BILITOT 1.3 (H) 07/30/2020 0754         Impression and  Plan:  1.    Stage III squamous cell carcinoma of the right nasopharynx documented in July 2021.    He is getting definitive therapy with radiation and weekly cisplatin and has completed the 1st week of therapy with few complications.  Risks and benefits of continuing this treatment were discussed at this time is agreeable to continue.  This is the only way to improve his condition is to shrink the tumor with treatment.  Laboratory data reviewed today and will proceed with cycle 2 of cisplatin as scheduled on 08/06/2020.  2.  IV access: Port-A-Cath inserted without any complications at this time.  This will continue to be in use.  3.  Nutrition: Feeding  tube in place and will be used as the main source of nutrition moving forward.  4.  Antiemetics: No nausea or vomiting reported at this time Compazine is available to him.  5.  Renal function surveillance: Baseline kidney function remains normal and this will be monitored closely.  He will continue to receive weekly hydration appointments.  6.  Pain: Currently on fentanyl 50 mcg which we will continue to increase the dose of pain is not controlled.   7.  Psychosocial support: We will continue to have supportive services to assess his needs including social work as well as nutrition.    8.  Follow-up: He will continue to follow weekly for cisplatin therapy.  30  minutes were spent on this encounter.  The time was dedicated to reviewing laboratory data, disease status update and outlining future plan of care.    Zola Button, MD 9/7/202112:00 PM

## 2020-08-05 NOTE — Progress Notes (Signed)
Met with patient's son to discuss grant details.Patient approved for one-time $1000 [employer] grant to assist with personal expenses while going through treatment.

## 2020-08-06 ENCOUNTER — Encounter: Payer: Self-pay | Admitting: Oncology

## 2020-08-06 ENCOUNTER — Ambulatory Visit
Admission: RE | Admit: 2020-08-06 | Discharge: 2020-08-06 | Disposition: A | Discharge status: Home / Self Care | Payer: Medicare HMO | Source: Ambulatory Visit | Attending: Radiation Oncology | Admitting: Radiation Oncology

## 2020-08-06 ENCOUNTER — Other Ambulatory Visit: Payer: Self-pay

## 2020-08-06 ENCOUNTER — Inpatient Hospital Stay: Payer: Medicare HMO

## 2020-08-06 DIAGNOSIS — C111 Malignant neoplasm of posterior wall of nasopharynx: Secondary | ICD-10-CM | POA: Diagnosis not present

## 2020-08-06 DIAGNOSIS — Z5111 Encounter for antineoplastic chemotherapy: Secondary | ICD-10-CM | POA: Diagnosis not present

## 2020-08-06 DIAGNOSIS — C119 Malignant neoplasm of nasopharynx, unspecified: Secondary | ICD-10-CM

## 2020-08-06 MED ORDER — SODIUM CHLORIDE 0.9 % IV SOLN
Freq: Once | INTRAVENOUS | Status: AC
  Filled 2020-08-06: qty 250

## 2020-08-06 MED ORDER — PALONOSETRON HCL INJECTION 0.25 MG/5ML
0.2500 mg | Freq: Once | INTRAVENOUS | Status: AC
  Administered 2020-08-06: 0.25 mg via INTRAVENOUS

## 2020-08-06 MED ORDER — POTASSIUM CHLORIDE 2 MEQ/ML IV SOLN
Freq: Once | INTRAVENOUS | Status: AC
  Filled 2020-08-06: qty 10

## 2020-08-06 MED ORDER — SODIUM CHLORIDE 0.9 % IV SOLN
10.0000 mg | Freq: Once | INTRAVENOUS | Status: AC
  Administered 2020-08-06: 10 mg via INTRAVENOUS
  Filled 2020-08-06: qty 10

## 2020-08-06 MED ORDER — HEPARIN SOD (PORK) LOCK FLUSH 100 UNIT/ML IV SOLN
500.0000 [IU] | Freq: Once | INTRAVENOUS | Status: AC | PRN
  Administered 2020-08-06: 500 [IU]
  Filled 2020-08-06: qty 5

## 2020-08-06 MED ORDER — PALONOSETRON HCL INJECTION 0.25 MG/5ML
INTRAVENOUS | Status: AC
  Filled 2020-08-06: qty 5

## 2020-08-06 MED ORDER — SODIUM CHLORIDE 0.9 % IV SOLN
30.0000 mg/m2 | Freq: Once | INTRAVENOUS | Status: AC
  Administered 2020-08-06: 50 mg via INTRAVENOUS
  Filled 2020-08-06: qty 50

## 2020-08-06 MED ORDER — SODIUM CHLORIDE 0.9 % IV SOLN
150.0000 mg | Freq: Once | INTRAVENOUS | Status: AC
  Administered 2020-08-06: 150 mg via INTRAVENOUS
  Filled 2020-08-06: qty 150

## 2020-08-06 MED ORDER — SODIUM CHLORIDE 0.9% FLUSH
10.0000 mL | INTRAVENOUS | Status: DC | PRN
  Administered 2020-08-06: 10 mL
  Filled 2020-08-06: qty 10

## 2020-08-06 NOTE — Progress Notes (Signed)
[facility] Progress Note   Willie Kennedy [id number] 07/28/1951 69 y.o.  Willie Kennedy is managed by Dr. 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[place]) - Plan: heparin lock flush 100 unit/mL, sodium chloride flush (NS) 0.9 % injection 10 mL, 0.9 %  sodium chloride infusion, DISCONTINUED: heparin lock flush 100 unit/mL, DISCONTINUED: sodium chloride flush (NS) 0.9 % injection 10 mL, DISCONTINUED: 0.9 %  sodium chloride infusion  Non-intractable vomiting with nausea, unspecified vomiting type - Plan: prochlorperazine (COMPAZINE) injection 10 mg, promethazine (PHENERGAN) 6.25 MG/5ML syrup  Neoplasm related pain - Plan: morphine 2 MG/ML injection 2 mg, fentaNYL (DURAGESIC) 50 MCG/HR, oxyCODONE (ROXICODONE) 5 MG/5ML solution  Nasopharyngeal cancer: The patient is managed by Dr. Alen Blew and is status post cycle 1, day 1 of cisplatin which was dosed on 07/30/2020.  He continues to receive daily radiation.  Neoplasm related pain: The patient was given morphine sulfate 2 mg IV x1 today.  His recently started fentanyl patch was increased to 50 mcg every 72 hours and he was given a prescription of Roxicodone liquid for breakthrough pain.  Nausea: Patient was given Compazine 10 mg IV today and was given a prescription for Phenergan syrup 6.25 mg with instructions to use 6.25 to 12.5 mg via tube every 6 hours for nausea.  Please see After Visit Summary for patient specific instructions.  Future Appointments  Date Time Provider [place]  08/07/2020 11:30 AM [facility] LINAC 4 [facility] None  08/08/2020 10:15 AM [facility] LINAC 4 [facility] None  08/08/2020 11:15 AM Neff, Barbara L, RD [facility]  None  08/11/2020 10:15 AM [facility] LINAC 4 [facility] None  08/12/2020  8:00 AM [facility] LAB [facility] None  08/12/2020  8:15 AM [facility] [place] FLUSH [facility] None  08/12/2020  8:45 AM Shadad, Mathis Dad, MD [facility] None  08/12/2020  9:45 AM [facility] LINAC 4 [facility] None  08/12/2020 10:00 AM [facility] INFUSION [facility] None  08/12/2020 12:00 PM Jennet Maduro, RD [facility] None  08/13/2020 10:15 AM [facility] LINAC 4 [facility] None  08/14/2020 10:15 AM [facility] LINAC 4 [facility] None  08/15/2020 10:15 AM [facility] LINAC 4 [facility] None  08/18/2020 10:15 AM [facility] LINAC 4 [facility] None  08/19/2020  7:45 AM [facility] LAB [facility] None  08/19/2020  8:00 AM [facility] [place] FLUSH [facility] None  08/19/2020  8:45 AM [facility] [id number] [facility] None  08/19/2020  9:00 AM [facility] INFUSION [facility] None  08/19/2020 11:15 AM Neff, Barbara L, RD [facility] None  08/20/2020 10:15 AM [facility] LINAC 4 [facility] None  08/21/2020 10:15 AM [facility] LINAC 4 [facility] None  08/22/2020 10:15 AM [facility] LINAC 4 [facility] None  08/25/2020 10:15 AM [facility] LINAC 4 [facility] None  08/26/2020  7:45 AM [facility] LAB [facility] None  08/26/2020  8:00 AM [facility] [place] FLUSH [facility] None  08/26/2020  8:45 AM [facility] [id number] [facility] None  08/26/2020  9:00 AM [facility] INFUSION [facility] None  08/26/2020  9:45 AM Zenia Resides, Joli, RD [facility] None  08/27/2020 10:15 AM [facility] LINAC 4 [facility] None  08/28/2020 10:15 AM [facility] LINAC 4 [facility] None  08/28/2020 12:00 PM Cameron Sprang, MD [facility] None  08/29/2020 10:15 AM [facility] LINAC 4 [facility] None  09/01/2020  8:00 AM [facility] LAB [facility] None  09/01/2020  8:15 AM [facility] [place] FLUSH [facility] None  09/01/2020  8:45 AM Wyatt Portela, MD [facility] None  09/01/2020  9:30 AM [facility] LINAC 4 [facility] None  09/02/2020  8:15 AM [facility] [id number] [facility]  None  09/02/2020  8:30 AM [facility] INFUSION [facility] None  09/02/2020  9:00 AM Neff, Barbara L, RD [facility] None  09/03/2020 10:15 AM [facility] LINAC 4 [facility] None  09/04/2020 10:15 AM [facility] LINAC 4 [facility] None  09/05/2020 10:15 AM [facility] LINAC 4 [facility] None  09/08/2020 10:15 AM [facility] LINAC 4 [facility] None  09/09/2020  7:45 AM [facility] LAB [facility] None  09/09/2020  8:00 AM [facility] [place] FLUSH [facility] None  09/09/2020  8:15 AM [facility] [id number] [facility] None  09/09/2020  8:30 AM Shadad, Mathis Dad, MD [facility] None  09/09/2020  9:00 AM [facility] INFUSION [facility] None  09/09/2020  9:45 AM Jennet Maduro, RD [facility] None  09/10/2020 10:15 AM [facility] LINAC 4 [facility] None  09/11/2020 10:15 AM [facility] LINAC 4 [facility] None  09/12/2020 10:15 AM [facility] LINAC 4 [facility] None  09/15/2020 10:15 AM [facility] LINAC 4 [facility] None  09/16/2020 10:15 AM [facility] LINAC 4 [facility] None  09/17/2020 10:15 AM [facility] LINAC 4 [facility] None    No orders of the defined types were placed in this encounter.      Subjective:   Patient ID:  Willie Kennedy is a 69 y.o. (DOB 1951/01/03) male.  Chief Complaint: No chief complaint on file.   HPI Willie Kennedy  is a 69 y.o. 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  He is followed by Dr. Alen Blew and is status post cycle 1, day 1 of cisplatin which she received yesterday.  He continues to receive daily concurrent radiation therapy.  He presents to the clinic today with his brother.  He has been given a neck brace to wear but is not wearing it today.  He has recently been started on fentanyl 25 mcg patches but is having significant pain at the base of his skull which increases in severity with setting up.  He is also having nausea.  He is using a PEG tube for feeding.  He is a poor historian and has no other issues of concern  today.  His brother reports that they are locking up his narcotics and is safe at his home as they are concerned that they may be missed use by others.  Willie Kennedy is the primary care provider for his wife who is disabled.  The patient is illiterate.  His brother is helping him to organize his medications.  Medications: I have reviewed the patient's current medications.  Allergies: No Known Allergies  Past Medical History:  Diagnosis Date   Biceps tendon rupture    h/o Right biceps tendon rupture   CAD (coronary artery disease)    a. ETT myoview (10/11) with EF 52%, reversible inferior perfusion defect, 1 mm inferolateral ST depression with exercise, 7"16" and stopped due to dyspnea and leg weakness -> LHC (10/11), EF 55%, 40% pRCA, 40% mRCA, 40% prox to mid CFX. medical treatment.   Cancer ([place])    skin cancer behind left ear    Dizziness    with bending over    Hard of hearing    more per right ear   Headache    with bright light    History of bronchitis    History of hiatal hernia    Hyperlipidemia    Impaired glucose tolerance    Numbness and tingling in left arm  if holds arm upward too long pt states his primary MD is aware   Osteoarthritis    Peripheral vascular disease ([place]) 2005   a. s/p left fem-pop Dr.Lawson in 2005.   Shortness of breath dyspnea    walking distances or climbing stairs   Tinnitus     Past Surgical History:  Procedure Laterality Date   ds/p left femoral artery occlusion  2005   s/p left fem-pop bypass- Dr. Kellie Simmering   HERNIA REPAIR     IR GASTROSTOMY TUBE MOD SED  07/24/2020   IR IMAGING GUIDED PORT INSERTION  07/24/2020   MULTIPLE EXTRACTIONS WITH ALVEOLOPLASTY N/A 07/17/2020   Procedure: Extraction of tooth #'s 2,77,82,42,PNT 31 with alveoloplasty;  Surgeon: Lenn Cal, DDS;  Location: [facility];  Service: Oral Surgery;  Laterality: N/A;   MYRINGOTOMY WITH TUBE PLACEMENT Right 06/26/2020   Procedure: MYRINGOTOMY WITH TUBE  PLACEMENT RIGHT  EAR;  Surgeon: Melida Quitter, MD;  Location: [facility];  Service: ENT;  Laterality: Right;   NASAL ENDOSCOPY N/A 06/26/2020   Procedure: NASAL ENDOSCOPY W/NASOPHARYNGEAL BIOPSY;  Surgeon: Melida Quitter, MD;  Location: [facility];  Service: ENT;  Laterality: N/A;   PR VEIN BYPASS GRAFT,AORTO-FEM-POP     s/p right bicep tendon rupture repair     VENTRAL HERNIA REPAIR N/A 08/22/2015   Procedure: OPEN VENTRAL HERNIA REPAIR WITH MESH;  Surgeon: Jackolyn Confer, MD;  Location: [facility];  Service: General;  Laterality: N/A;    Family History  Problem Relation Age of Onset   Cervical cancer Mother    Cancer Mother        cervical   Lung cancer Father    Cancer Father        lung    Social History   Socioeconomic History   Marital status: Married    Spouse name: Not on file   Number of children: 1   Years of education: Not on file   Highest education level: Not on file  Occupational History   Occupation: Master woood craftsman  Tobacco Use   Smoking status: Former Smoker    Packs/day: 7.00    Years: 40.00    Pack years: 280.00    Types: Cigarettes    Quit date: 11/29/1994    Years since quitting: 25.7   Smokeless tobacco: Never Used  Vaping Use   Vaping Use: Never used  Substance and Sexual Activity   Alcohol use: No   Drug use: No   Sexual activity: Not Currently  Other Topics Concern   Not on file  Social History Narrative   Right handed      Highest level of edu- 11th grade      Lives in one story home      Social Determinants of Health   Financial Resource Strain: Medium Risk   Difficulty of Paying Living Expenses: Somewhat hard  Food Insecurity: No Food Insecurity   Worried About Charity fundraiser in the Last Year: Never true   [place] in the Last Year: Never true  Transportation Needs: No Transportation Needs   Lack of Transportation (Medical): No   Lack of Transportation  (Non-Medical): No  Physical Activity:    Days of Exercise per Week: Not on file   Minutes of Exercise per Session: Not on file  Stress:    Feeling of Stress : Not on file  Social Connections:    Frequency of Communication with Friends and Family: Not on file  Frequency of Social Gatherings with Friends and Family: Not on file   Attends Religious Services: Not on file   Active Member of Clubs or Organizations: Not on file   Attends Archivist Meetings: Not on file   Marital Status: Not on file  Intimate Partner Violence:    Fear of Current or Ex-Partner: Not on file   Emotionally Abused: Not on file   Physically Abused: Not on file   Sexually Abused: Not on file    Past Medical History, Surgical history, Social history, and Family history were reviewed and updated as appropriate.   Please see review of systems for further details on the patient's review from today.   Review of Systems:  Review of Systems  Constitutional: Positive for appetite change. Negative for chills, diaphoresis and fever.  HENT: Negative for sore throat and trouble swallowing.   Respiratory: Negative for cough, choking, shortness of breath and wheezing.   Cardiovascular: Negative for chest pain and palpitations.  Gastrointestinal: Positive for nausea and vomiting. Negative for constipation and diarrhea.  Genitourinary: Negative for decreased urine volume.  Neurological: Negative for headaches.       Pain at the base of the school secondary to an occipital invasion by the patient's nasopharyngeal cancer.    Objective:   Physical Exam:  BP (!) 162/80    Pulse 60    Temp 97.9 F (36.6 C)    Resp 18    SpO2 96%  ECOG: 2  Physical Exam Constitutional:      General: He is not in acute distress.    Appearance: He is not diaphoretic.     Comments: The patient is laying on his left side in a recliner.  HENT:     Head: Normocephalic and atraumatic.  Cardiovascular:     Rate and  Rhythm: Normal rate and regular rhythm.     Heart sounds: Normal heart sounds. No murmur heard.  No friction rub. No gallop.   Pulmonary:     Effort: Pulmonary effort is normal. No respiratory distress.     Breath sounds: Normal breath sounds. No wheezing or rales.  Skin:    General: Skin is warm and dry.     Findings: No erythema or rash.     Lab Review:     Component Value Date/Time   NA 130 (L) 08/05/2020 1140   K 4.3 08/05/2020 1140   CL 93 (L) 08/05/2020 1140   CO2 28 08/05/2020 1140   GLUCOSE 113 (H) 08/05/2020 1140   BUN 18 08/05/2020 1140   CREATININE 0.74 08/05/2020 1140   CREATININE 1.00 06/27/2015 1222   CALCIUM 9.0 08/05/2020 1140   PROT 7.0 08/05/2020 1140   ALBUMIN 3.1 (L) 08/05/2020 1140   AST 19 08/05/2020 1140   ALT 21 08/05/2020 1140   ALKPHOS 102 08/05/2020 1140   BILITOT 0.7 08/05/2020 1140   GFRNONAA >60 08/05/2020 1140   GFRAA >60 08/05/2020 1140       Component Value Date/Time   WBC 11.9 (H) 08/05/2020 1140   WBC 11.8 (H) 07/29/2020 1137   RBC 4.57 08/05/2020 1140   HGB 14.3 08/05/2020 1140   HCT 41.8 08/05/2020 1140   PLT 338 08/05/2020 1140   MCV 91.5 08/05/2020 1140   MCH 31.3 08/05/2020 1140   MCHC 34.2 08/05/2020 1140   RDW 13.9 08/05/2020 1140   LYMPHSABS 1.1 08/05/2020 1140   MONOABS 0.8 08/05/2020 1140   EOSABS 0.0 08/05/2020 1140   BASOSABS 0.0 08/05/2020 1140   -------------------------------  Imaging from last 24 hours (if applicable):  Radiology interpretation: DG Cervical Spine Complete  Result Date: 07/24/2020 CLINICAL DATA:  Severe posterior neck pain. EXAM: CERVICAL SPINE - COMPLETE 4+ VIEW COMPARISON:  CT 06/04/2020.  MRI cervical spine 08/17/2016. FINDINGS: Head is angulated to the right. PowerPort catheter noted. Soft tissues are unremarkable. Diffuse osteopenia and degenerative change. 3 mm anterolisthesis C4 on C5. Multifocal bilateral neural foraminal narrowing. No evidence of fracture or dislocation. Pulmonary  apices are clear. Carotid vascular calcification again noted. IMPRESSION: 1. Diffuse osteopenia and degenerative change. 3 mm anterolisthesis C4 on C5. Multifocal bilateral neural foraminal narrowing. No acute bony abnormality identified. Reference is made to prior CT report of 06/04/2020. 2.  Carotid vascular disease. Electronically Signed   By: Marcello Moores  Register   On: 07/24/2020 13:50   CT Cervical Spine Wo Contrast  Result Date: 07/29/2020 CLINICAL DATA:  Neck trauma. Additional history provided: Patient reports posterior neck and right lateral neck pain for 5 days since since injuring neck while mowing yard. EXAM: CT CERVICAL SPINE WITHOUT CONTRAST TECHNIQUE: Multidetector CT imaging of the cervical spine was performed without intravenous contrast. Multiplanar CT image reconstructions were also generated. COMPARISON:  PET-CT 07/07/2020, neck CT 06/04/2020. FINDINGS: Alignment: No significant spondylolisthesis. Skull base and vertebrae: The basion-dental and atlanto-dental intervals are maintained.No evidence of acute fracture to the cervical spine. Soft tissues and spinal canal: No prevertebral fluid or swelling. No visible canal hematoma. Disc levels: Cervical spondylosis with multilevel disc space narrowing, disc bulges and uncovertebral hypertrophy. No high-grade bony spinal canal stenosis. Upper chest: Emphysema and biapical pleuroparenchymal scarring. Partially visualized right chest infusion port catheter. Other: New from the prior neck CT of 06/04/2020, there is a mildly displaced acute fracture of the right occipital calvarium which may extend to the foramen magnum (for instance as seen on series 7, images 18-23) (series 9, image 19). This fracture may also extend to involve the right jugular foramen. A known necrotic mass centered within the posterior right nasopharynx and upper right neck is inadequately reassessed on this noncontrast examination. However, there has been progressive destructive  osseous erosion of the right occipital condyle, petrous apex and right aspect of the clivus. These results were called by telephone at the time of interpretation on 07/29/2020 at 3:08 pm to provider JOSHUA LONG , who verbally acknowledged these results. IMPRESSION: Mildly displaced acute fracture of the right occipital calvarium. This fracture may extend to the foramen magnum and may also extend to involve the right jugular foramen. A known necrotic mass centered within the posterior right nasopharynx and upper right neck is inadequately reassessed on this noncontrast examination. However, there has been progressive destructive osseous erosion of the right occipital condyle, right petrous apex and right aspect of the clivus as compared to the neck CT of 06/04/2020. Consider contrast-enhanced brain MRI to assess for injury to the right sigmoid sinus/jugular vein and to assess for involvement of the these vascular structures by the right nasopharyngeal mass. No evidence of acute fracture to the cervical spine. Cervical spondylosis as described. Electronically Signed   By: Kellie Simmering DO   On: 07/29/2020 15:09   MR Brain W and Wo Contrast  Result Date: 07/29/2020 CLINICAL DATA:  Head trauma EXAM: MRI HEAD WITHOUT AND WITH CONTRAST TECHNIQUE: Multiplanar, multiecho pulse sequences of the brain and surrounding structures were obtained without and with intravenous contrast. CONTRAST:  84mL GADAVIST GADOBUTROL 1 MMOL/ML IV SOLN COMPARISON:  06/04/2020 CT neck. FINDINGS: Brain: Diffusion-weighted hyperintensity involving the anterior pons  and midbrain without focal ADC correlate is likely artifactual. No acute infarct. No intracranial hemorrhage. No midline shift, ventriculomegaly or extra-axial fluid collection. Mild chronic microvascular ischemic changes. Sequela of remote insults involving the left parietal lobe and vermis. No abnormal enhancement involving the brain parenchyma. Vascular: Major intracranial flow voids  are preserved. Skull and upper cervical spine: Right occipital condyle erosions and the cervical spine are better evaluated on recent CT neck. No focal calvarial lesions. Sinuses/Orbits: No acute orbital finding. Pneumatized paranasal sinuses. Right mastoid effusion. Other: Partially imaged necrotic right neck mass along the nasopharynx, underlying the occipital condyle is fully characterized on recent CT neck. Neoplastic involvement of the right skull base and erosive changes involving the right occipital bone are also better evaluated on recent CT. IMPRESSION: No acute intracranial pathology. Partially imaged tumor along the right nasopharynx with erosive changes involving the right skull base, better characterized on recent CT neck. No abnormal enhancement involving the brain parenchyma. Electronically Signed   By: Primitivo Gauze M.D.   On: 07/29/2020 17:38   CT ABDOMEN PELVIS W CONTRAST  Result Date: 07/29/2020 CLINICAL DATA:  69 year old male with history of abdominal pain. Feeding tube placed on 07/24/2020. EXAM: CT ABDOMEN AND PELVIS WITH CONTRAST TECHNIQUE: Multidetector CT imaging of the abdomen and pelvis was performed using the standard protocol following bolus administration of intravenous contrast. CONTRAST:  158mL OMNIPAQUE IOHEXOL 300 MG/ML  SOLN COMPARISON:  No priors. FINDINGS: Lower chest: Paraseptal emphysema noted in the lower lungs bilaterally. Atherosclerotic calcifications in the descending thoracic aorta and right coronary artery. Hepatobiliary: Subcentimeter low-attenuation lesion in segment 4A of the liver, too small to characterize, but statistically likely to represent a tiny cyst. No other larger more suspicious hepatic lesions. No intra or extrahepatic biliary ductal dilatation. Gallbladder is normal in appearance. Pancreas: No pancreatic mass. No pancreatic ductal dilatation. No pancreatic or peripancreatic fluid collections or inflammatory changes. Spleen: Unremarkable.  Adrenals/Urinary Tract: Multiple low-attenuation lesions in both kidneys compatible with simple cysts, largest of which measures 2 cm in the lower pole of the right kidney. Other subcentimeter low-attenuation lesions in both kidneys, too small to definitively characterize, but statistically likely to represent tiny cysts. Bilateral adrenal glands are normal in appearance. No hydroureteronephrosis. Urinary bladder is normal in appearance. Stomach/Bowel: Percutaneous gastrostomy tube with retention tip in the distal body of the stomach. No pathologic dilatation of small bowel or colon. Normal appendix. Vascular/Lymphatic: Aortic atherosclerosis, without evidence of aneurysm or dissection in the abdominal or pelvic vasculature. No lymphadenopathy noted in the abdomen or pelvis. Reproductive: Prostate gland and seminal vesicles are unremarkable in appearance. Other: No significant volume of ascites. Trace volume of pneumoperitoneum most evident adjacent to the liver. Musculoskeletal: There are no aggressive appearing lytic or blastic lesions noted in the visualized portions of the skeleton. IMPRESSION: 1. Trace volume of pneumoperitoneum, within normal limits given the recent placement of the percutaneous gastrostomy tube. No other acute findings are noted in the abdomen or pelvis. 2. Paraseptal emphysema. 3. Aortic atherosclerosis, in addition to at least right coronary artery disease. Assessment for potential risk factor modification, dietary therapy or pharmacologic therapy may be warranted, if clinically indicated. 4. Additional incidental findings, as above. Electronically Signed   By: Vinnie Langton M.D.   On: 07/29/2020 14:56   IR GASTROSTOMY TUBE MOD SED  Result Date: 07/24/2020 INDICATION: 69 year old male referred for gastrostomy placement. EXAM: PERC PLACEMENT GASTROSTOMY MEDICATIONS: 2 g Ancef; Antibiotics were administered within 1 hour of the procedure. ANESTHESIA/SEDATION: Versed 0.5 mg  IV; Fentanyl  25 mcg IV Moderate Sedation Time:  10 minutes The patient was continuously monitored during the procedure by the interventional radiology nurse under my direct supervision. CONTRAST:  33mL OMNIPAQUE IOHEXOL 300 MG/ML SOLN - administered into the gastric lumen. FLUOROSCOPY TIME:  Fluoroscopy Time: 0 minutes 48 seconds (6 mGy). COMPLICATIONS: None PROCEDURE: Informed written consent was obtained from the patient and the patient's family after a thorough discussion of the procedural risks, benefits and alternatives. All questions were addressed. Maximal Sterile Barrier Technique was utilized including caps, mask, sterile gowns, sterile gloves, sterile drape, hand hygiene and skin antiseptic. A timeout was performed prior to the initiation of the procedure. The epigastrium was prepped with Betadine in a sterile fashion, and a sterile drape was applied covering the operative field. A sterile gown and sterile gloves were used for the procedure. A 5-French orogastric tube is placed under fluoroscopic guidance. Scout imaging of the abdomen confirms barium within the transverse colon. The stomach was distended with gas. Under fluoroscopic guidance, an 18 gauge needle was utilized to puncture the anterior wall of the body of the stomach. An Amplatz wire was advanced through the needle passing a T fastener into the lumen of the stomach. The T fastener was secured for gastropexy. A 9-French sheath was inserted. A snare was advanced through the 9-French sheath. A Britta Mccreedy was advanced through the orogastric tube. It was snared then pulled out the oral cavity, pulling the snare, as well. The leading edge of the gastrostomy was attached to the snare. It was then pulled down the esophagus and out the percutaneous site. Tube secured in place. Contrast was injected. Patient tolerated the procedure well and remained hemodynamically stable throughout. No complications were encountered and no significant blood loss encountered.  IMPRESSION: Status post fluoroscopic placed percutaneous gastrostomy tube, with 20 [place] pull-through. Signed, Dulcy Fanny. Earleen Newport, DO [facility] Radiology Electronically Signed   By: Corrie Mckusick D.O.   On: 07/24/2020 13:07   IR IMAGING GUIDED PORT INSERTION  Result Date: 07/24/2020 INDICATION: 69 year old male with a history head and neck carcinoma referred for port catheter placement EXAM: IMAGE GUIDED PORT PLACEMENT MEDICATIONS: 2 g Ancef; The antibiotic was administered within an appropriate time interval prior to skin puncture. ANESTHESIA/SEDATION: Moderate (conscious) sedation was employed during this procedure. A total of Versed 1.0 mg and Fentanyl 50 mcg was administered intravenously. Moderate Sedation Time: 18 minutes. The patient's level of consciousness and vital signs were monitored continuously by radiology nursing throughout the procedure under my direct supervision. FLUOROSCOPY TIME:  Fluoroscopy Time: 0 minutes 6 seconds (1 mGy). COMPLICATIONS: None PROCEDURE: The procedure, risks, benefits, and alternatives were explained to the patient. Questions regarding the procedure were encouraged and answered. The patient understands and consents to the procedure. Ultrasound survey was performed with images stored and sent to PACs. The right neck and chest was prepped with chlorhexidine, and draped in the usual sterile fashion using maximum barrier technique (cap and mask, sterile gown, sterile gloves, large sterile sheet, hand hygiene and cutaneous antiseptic). Antibiotic prophylaxis was provided with 2.0g Ancef administered IV one hour prior to skin incision. Local anesthesia was attained by infiltration with 1% lidocaine without epinephrine. Ultrasound demonstrated patency of the right internal jugular vein, and this was documented with an image. Under real-time ultrasound guidance, this vein was accessed with a 21 gauge micropuncture needle and image  documentation was performed. A small dermatotomy was made at the access site with an 11 scalpel. A  0.018" wire was advanced into the SVC and used to estimate the length of the internal catheter. The access needle exchanged for a 76F micropuncture vascular sheath. The 0.018" wire was then removed and a 0.035" wire advanced into the IVC. An appropriate location for the subcutaneous reservoir was selected below the clavicle and an incision was made through the skin and underlying soft tissues. The subcutaneous tissues were then dissected using a combination of blunt and sharp surgical technique and a pocket was formed. A single lumen power injectable portacatheter was then tunneled through the subcutaneous tissues from the pocket to the dermatotomy and the port reservoir placed within the subcutaneous pocket. The venous access site was then serially dilated and a peel away vascular sheath placed over the wire. The wire was removed and the port catheter advanced into position under fluoroscopic guidance. The catheter tip is positioned in the cavoatrial junction. This was documented with a spot image. The portacatheter was then tested and found to flush and aspirate well. The port was flushed with saline followed by 100 units/mL heparinized saline. The pocket was then closed in two layers using first subdermal inverted interrupted absorbable sutures followed by a running subcuticular suture. The epidermis was then sealed with Dermabond. The dermatotomy at the venous access site was also seal with Dermabond. Patient tolerated the procedure well and remained hemodynamically stable throughout. No complications encountered and no significant blood loss encountered IMPRESSION: Status post right IJ port catheter placement. Signed, Dulcy Fanny. Dellia Nims, RPVI [facility] Radiology Electronically Signed   By: Corrie Mckusick D.O.   On: 07/24/2020 13:07        This case was  discussed Dr. Alen Blew. He expressed agreement with my management of this patient.

## 2020-08-06 NOTE — Progress Notes (Signed)
MD aware of 08/05/20 sodium level. Mag level ordered for 08/12/20.  Hardie Pulley, PharmD, BCPS, BCOP

## 2020-08-06 NOTE — Patient Instructions (Signed)
[facility] Discharge Instructions for Patients Receiving Chemotherapy  Today you received the following chemotherapy agents:  Cisplatin  To help prevent nausea and vomiting after your treatment, we encourage you to take your nausea medication as ordered per MD.    If you develop nausea and vomiting that is not controlled by your nausea medication, call the clinic.   BELOW ARE SYMPTOMS THAT SHOULD BE REPORTED IMMEDIATELY:  *FEVER GREATER THAN 100.5 F  *CHILLS WITH OR WITHOUT FEVER  NAUSEA AND VOMITING THAT IS NOT CONTROLLED WITH YOUR NAUSEA MEDICATION  *UNUSUAL SHORTNESS OF BREATH  *UNUSUAL BRUISING OR BLEEDING  TENDERNESS IN MOUTH AND THROAT WITH OR WITHOUT PRESENCE OF ULCERS  *URINARY PROBLEMS  *BOWEL PROBLEMS  UNUSUAL RASH Items with * indicate a potential emergency and should be followed up as soon as possible.  Feel free to call the clinic should you have any questions or concerns. The clinic phone number is [phone].  Please show the CHEMO ALERT CARD at check-in to the Emergency Department and triage nurse.   

## 2020-08-06 NOTE — Progress Notes (Signed)
Urine output 300cc. Premeds given

## 2020-08-06 NOTE — Progress Notes (Signed)
Patient's son obtained grant signature and left copy in my box. He has a copy of the expense sheet to explain to his dad.  They have my card for any additional financial questions or concerns.

## 2020-08-07 ENCOUNTER — Ambulatory Visit
Admission: RE | Admit: 2020-08-07 | Discharge: 2020-08-07 | Disposition: A | Discharge status: Home / Self Care | Payer: Medicare HMO | Source: Ambulatory Visit | Attending: Radiation Oncology | Admitting: Radiation Oncology

## 2020-08-07 ENCOUNTER — Other Ambulatory Visit: Payer: Self-pay

## 2020-08-07 ENCOUNTER — Other Ambulatory Visit: Payer: Self-pay | Admitting: Radiation Oncology

## 2020-08-07 ENCOUNTER — Telehealth: Payer: Self-pay | Admitting: Oncology

## 2020-08-07 DIAGNOSIS — C111 Malignant neoplasm of posterior wall of nasopharynx: Secondary | ICD-10-CM | POA: Diagnosis not present

## 2020-08-07 NOTE — Telephone Encounter (Signed)
Scheduled per los, spoke with patient's spouse. Patient will be notified of upcoming appointments.

## 2020-08-08 ENCOUNTER — Telehealth: Payer: Self-pay

## 2020-08-08 ENCOUNTER — Other Ambulatory Visit: Payer: Self-pay

## 2020-08-08 ENCOUNTER — Encounter: Payer: Self-pay | Admitting: Nutrition

## 2020-08-08 ENCOUNTER — Inpatient Hospital Stay: Payer: Medicare HMO | Admitting: Nutrition

## 2020-08-08 ENCOUNTER — Ambulatory Visit
Admission: RE | Admit: 2020-08-08 | Discharge: 2020-08-08 | Disposition: A | Discharge status: Home / Self Care | Payer: Medicare HMO | Source: Ambulatory Visit | Attending: Radiation Oncology | Admitting: Radiation Oncology

## 2020-08-08 DIAGNOSIS — C111 Malignant neoplasm of posterior wall of nasopharynx: Secondary | ICD-10-CM | POA: Diagnosis not present

## 2020-08-08 NOTE — Telephone Encounter (Signed)
Called and spoke with patient's sister-in-law Meredith Mody to let her know that patient's insurance has been denying coverage for any form of reflux medicine that Dr. Isidore Moos has prescribed. Instructed her that per pharmacist and Dr. Isidore Moos, best course would be to buy OTC omeprazole 20 mg tablet. Instructed her to crush pill and mix with water, and then instill in PEG (flushing with water before and after administration). [name] asked if she could buy capsule form of medication, and I advised her that per pharmacy the capsule form is extended release and therefore absorption would be different. [name] verbalized understanding and agreement of plan, and denied any other needs at this time.

## 2020-08-08 NOTE — Progress Notes (Signed)
Patient did not show up for nutrition follow-up.  He has a follow-up appointment on Tuesday, September 21 during infusion.

## 2020-08-09 ENCOUNTER — Inpatient Hospital Stay: Payer: Medicare HMO

## 2020-08-09 VITALS — BP 157/75 | HR 69 | Temp 98.3°F | Resp 18

## 2020-08-09 DIAGNOSIS — C119 Malignant neoplasm of nasopharynx, unspecified: Secondary | ICD-10-CM

## 2020-08-09 DIAGNOSIS — Z5111 Encounter for antineoplastic chemotherapy: Secondary | ICD-10-CM | POA: Diagnosis not present

## 2020-08-09 MED ORDER — HEPARIN SOD (PORK) LOCK FLUSH 100 UNIT/ML IV SOLN
500.0000 [IU] | Freq: Once | INTRAVENOUS | Status: AC | PRN
  Administered 2020-08-09: 500 [IU]
  Filled 2020-08-09: qty 5

## 2020-08-09 MED ORDER — SODIUM CHLORIDE 0.9 % IV SOLN
Freq: Once | INTRAVENOUS | Status: AC
  Filled 2020-08-09: qty 250

## 2020-08-09 MED ORDER — SODIUM CHLORIDE 0.9% FLUSH
10.0000 mL | Freq: Once | INTRAVENOUS | Status: AC | PRN
  Administered 2020-08-09: 10 mL
  Filled 2020-08-09: qty 10

## 2020-08-09 NOTE — Patient Instructions (Signed)

## 2020-08-11 ENCOUNTER — Ambulatory Visit
Admission: RE | Admit: 2020-08-11 | Discharge: 2020-08-11 | Disposition: A | Discharge status: Home / Self Care | Payer: Medicare HMO | Source: Ambulatory Visit | Attending: Radiation Oncology | Admitting: Radiation Oncology

## 2020-08-11 ENCOUNTER — Other Ambulatory Visit: Payer: Self-pay

## 2020-08-11 ENCOUNTER — Other Ambulatory Visit: Payer: Self-pay | Admitting: Radiation Oncology

## 2020-08-11 DIAGNOSIS — C111 Malignant neoplasm of posterior wall of nasopharynx: Secondary | ICD-10-CM | POA: Diagnosis not present

## 2020-08-11 MED ORDER — LIDOCAINE VISCOUS HCL 2 % MT SOLN: OROMUCOSAL | 4 refills | Status: AC

## 2020-08-11 MED FILL — DEXAMETHASONE 0.5 MG/5 ML L: 0.5 | 6 days supply | Qty: 500 | Fill #1

## 2020-08-11 MED FILL — LIDOCAINE VISCOUS HCL 2 % S: 2 | 10 days supply | Qty: 200 | Fill #0

## 2020-08-11 NOTE — Progress Notes (Signed)
Oncology Nurse Navigator Documentation  I met with patient during his weekly visit with Dr. Isidore Moos today. I provided him with extra syringes to use while instilling nutrition in his PEG. I also provided re-education regarding gravity feedings versus using the plunger to instill supplements in his feeding tube. He is aware to do feedings via gravity to promote proper intake of supplements into his stomach. He knows to call me if he has any further questions or concerns.  Harlow Asa RN, BSN, OCN Head & Neck Oncology Nurse [place] at [facility] Phone # [phone]  Fax # [phone]

## 2020-08-12 ENCOUNTER — Inpatient Hospital Stay: Payer: Medicare HMO

## 2020-08-12 ENCOUNTER — Ambulatory Visit
Admission: RE | Admit: 2020-08-12 | Discharge: 2020-08-12 | Disposition: A | Discharge status: Home / Self Care | Payer: Medicare HMO | Source: Ambulatory Visit | Attending: Radiation Oncology | Admitting: Radiation Oncology

## 2020-08-12 ENCOUNTER — Inpatient Hospital Stay (HOSPITAL_BASED_OUTPATIENT_CLINIC_OR_DEPARTMENT_OTHER): Payer: Medicare HMO | Admitting: Oncology

## 2020-08-12 ENCOUNTER — Other Ambulatory Visit: Payer: Self-pay

## 2020-08-12 VITALS — BP 124/88 | HR 85 | Temp 97.6°F | Resp 18 | Wt 113.8 lb

## 2020-08-12 DIAGNOSIS — G893 Neoplasm related pain (acute) (chronic): Secondary | ICD-10-CM | POA: Diagnosis not present

## 2020-08-12 DIAGNOSIS — C111 Malignant neoplasm of posterior wall of nasopharynx: Secondary | ICD-10-CM | POA: Diagnosis not present

## 2020-08-12 DIAGNOSIS — C119 Malignant neoplasm of nasopharynx, unspecified: Secondary | ICD-10-CM | POA: Diagnosis not present

## 2020-08-12 DIAGNOSIS — Z5111 Encounter for antineoplastic chemotherapy: Secondary | ICD-10-CM | POA: Diagnosis not present

## 2020-08-12 LAB — CBC WITH DIFFERENTIAL (CANCER CENTER ONLY)
Abs Immature Granulocytes: 0.04 10*3/uL (ref 0.00–0.07)
Basophils Absolute: 0 10*3/uL (ref 0.0–0.1)
Basophils Relative: 0 %
Eosinophils Absolute: 0 10*3/uL (ref 0.0–0.5)
Eosinophils Relative: 0 %
HCT: 39.2 % (ref 39.0–52.0)
Hemoglobin: 13.4 g/dL (ref 13.0–17.0)
Immature Granulocytes: 0 %
Lymphocytes Relative: 9 %
Lymphs Abs: 0.8 10*3/uL (ref 0.7–4.0)
MCH: 30.7 pg (ref 26.0–34.0)
MCHC: 34.2 g/dL (ref 30.0–36.0)
MCV: 89.7 fL (ref 80.0–100.0)
Monocytes Absolute: 0.6 10*3/uL (ref 0.1–1.0)
Monocytes Relative: 7 %
Neutro Abs: 7.6 10*3/uL (ref 1.7–7.7)
Neutrophils Relative %: 84 %
Platelet Count: 211 10*3/uL (ref 150–400)
RBC: 4.37 MIL/uL (ref 4.22–5.81)
RDW: 13.1 % (ref 11.5–15.5)
WBC Count: 9.1 10*3/uL (ref 4.0–10.5)
nRBC: 0 % (ref 0.0–0.2)

## 2020-08-12 LAB — CMP (CANCER CENTER ONLY)
ALT: 40 U/L (ref 0–44)
AST: 20 U/L (ref 15–41)
Albumin: 3 g/dL — ABNORMAL LOW (ref 3.5–5.0)
Alkaline Phosphatase: 95 U/L (ref 38–126)
Anion gap: 8 (ref 5–15)
BUN: 36 mg/dL — ABNORMAL HIGH (ref 8–23)
CO2: 28 mmol/L (ref 22–32)
Calcium: 8.4 mg/dL — ABNORMAL LOW (ref 8.9–10.3)
Chloride: 88 mmol/L — ABNORMAL LOW (ref 98–111)
Creatinine: 0.83 mg/dL (ref 0.61–1.24)
GFR, Est AFR Am: >60 mL/min (ref >60)
GFR, Estimated: >60 mL/min (ref >60)
Glucose, Bld: 109 mg/dL — ABNORMAL HIGH (ref 70–99)
Potassium: 3.5 mmol/L (ref 3.5–5.1)
Sodium: 124 mmol/L — ABNORMAL LOW (ref 135–145)
Total Bilirubin: 0.6 mg/dL (ref 0.3–1.2)
Total Protein: 6.5 g/dL (ref 6.5–8.1)

## 2020-08-12 LAB — MAGNESIUM: Magnesium: 1.9 mg/dL (ref 1.7–2.4)

## 2020-08-12 MED ORDER — POTASSIUM CHLORIDE 2 MEQ/ML IV SOLN
Freq: Once | INTRAVENOUS | Status: AC
  Filled 2020-08-12: qty 10

## 2020-08-12 MED ORDER — PALONOSETRON HCL INJECTION 0.25 MG/5ML
0.2500 mg | Freq: Once | INTRAVENOUS | Status: AC
  Administered 2020-08-12: 0.25 mg via INTRAVENOUS

## 2020-08-12 MED ORDER — SODIUM CHLORIDE 0.9 % IV SOLN
10.0000 mg | Freq: Once | INTRAVENOUS | Status: AC
  Administered 2020-08-12: 10 mg via INTRAVENOUS
  Filled 2020-08-12: qty 10

## 2020-08-12 MED ORDER — SODIUM CHLORIDE 0.9% FLUSH
10.0000 mL | INTRAVENOUS | Status: DC | PRN
  Administered 2020-08-12: 10 mL
  Filled 2020-08-12: qty 10

## 2020-08-12 MED ORDER — SODIUM CHLORIDE 0.9 % IV SOLN
150.0000 mg | Freq: Once | INTRAVENOUS | Status: AC
  Administered 2020-08-12: 150 mg via INTRAVENOUS
  Filled 2020-08-12: qty 150

## 2020-08-12 MED ORDER — SODIUM CHLORIDE 0.9 % IV SOLN
Freq: Once | INTRAVENOUS | Status: AC
  Filled 2020-08-12: qty 250

## 2020-08-12 MED ORDER — HEPARIN SOD (PORK) LOCK FLUSH 100 UNIT/ML IV SOLN
500.0000 [IU] | Freq: Once | INTRAVENOUS | Status: AC | PRN
  Administered 2020-08-12: 500 [IU]
  Filled 2020-08-12: qty 5

## 2020-08-12 MED ORDER — SODIUM CHLORIDE 0.9% FLUSH
10.0000 mL | Freq: Once | INTRAVENOUS | Status: AC | PRN
  Administered 2020-08-12: 10 mL
  Filled 2020-08-12: qty 10

## 2020-08-12 MED ORDER — PALONOSETRON HCL INJECTION 0.25 MG/5ML
INTRAVENOUS | Status: AC
  Filled 2020-08-12: qty 5

## 2020-08-12 MED ORDER — SODIUM CHLORIDE 0.9 % IV SOLN
30.0000 mg/m2 | Freq: Once | INTRAVENOUS | Status: AC
  Administered 2020-08-12: 50 mg via INTRAVENOUS
  Filled 2020-08-12: qty 50

## 2020-08-12 NOTE — Patient Instructions (Signed)

## 2020-08-12 NOTE — Progress Notes (Signed)
Nutrition Follow-up:  Patient with nasopharyngeal cancer.  Followed by Dr Alen Blew and Isidore Moos.  Met with patient during infusion.  Patient with pain in neck area.  Had blanket over head with just face showing.  Laying in recliner on side, tearful.  Reports that he gave 5 cartons of tube feeding yesterday and tolerated it.  Previously was giving 3 cartons of tube feeding.  Says that he is giving 1-2 syringes of water before and after each feeding.  He is not able to take anything by mouth.     Medications: reviewed  Labs: Na 124, glucose 109, Mag 1.9  Anthropometrics:   Weight 113 lb today decreased from 122 lb on 8/31.    7% weight loss in 2 weeks, significant   Estimated Energy Needs  Kcals: 1800-2000 Protein: 62-82 g Fluid: 2 L  NUTRITION DIAGNOSIS: Unintentional weight loss continues   INTERVENTION:  Continue 5 cartons of osmolite 1.5 via feeding tube.  Discussed spacing feeding out q 3 hours (8am, 11am, 2pm, 5pm and 8pm).  Flush with 60 ml before and 120 ml after each feeding. Patient verbalized tube feeding schedule to RD.   Tube feeding provides 1775 calories, 74.5 gram protein and 905 ml free water from formula (additional 900 ml provided by free water flush) total water 1805 ml.   Message sent to provider recommending checking phosphorus due to risk of refeeding and to supplement as needed.     MONITORING, EVALUATION, GOAL: weight trends, tube feeding tolerance   NEXT VISIT: Sept 21 during infusion, Barb  Nawaf Strange B. Zenia Resides, [place], [place] Registered Dietitian [phone] (mobile)

## 2020-08-12 NOTE — Progress Notes (Signed)
Hematology and Oncology Follow Up Visit  Willie Kennedy [id number] 26-May-1951 69 y.o. 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  His tumor invades the occipital bone and adenopathy detected.  Prior Therapy:  He is status post nasopharyngeal biopsy and right myringotomy completed by Dr. Redmond Baseman on June 04, 2020.   Current therapy:  Radiation therapy with weekly cisplatin chemotherapy started on July 30, 2020.  He is here for his 3rd week of treatment.  Interim History: Willie Kennedy presents today for return evaluation.  Since the last visit, he reports no major changes in his health.  His pain is more manageable this week compared to last week.  Continues to use fentanyl patch and oxycodone.  He denies any nausea or vomiting.  Denies any abdominal pain.  He does report increased mucus production and he spits periodically.  He has tolerated chemotherapy otherwise well.   .    Medications: Updated without changes. Current Outpatient Medications  Medication Sig Dispense Refill  . atorvastatin (LIPITOR) 20 MG tablet Take 20 mg by mouth at bedtime.  (Patient not taking: Reported on 08/05/2020)    . dexamethasone (DECADRON) 0.5 MG/5ML solution Take 6mL BID by PEG (with food). On 9/14, taper to 77mL daily with food. On 9/21, taper to 31mL daily with food.  On 9/28, taper to 90mL daily with food. Stop on 10/9. 500 mL 2  . fentaNYL (DURAGESIC) 50 MCG/HR Place 1 patch onto the skin every 3 (three) days. 10 patch 0  . gabapentin (NEURONTIN) 300 MG capsule Take 2 capsules in AM, 3 capsules in evening (Patient not taking: Reported on 08/05/2020) 450 capsule 3  . lansoprazole (PREVACID SOLUTAB) 15 MG disintegrating tablet Dissolve one tablet in mouth daily.  (Affordable alternative to previous liquid Rx for Omeprazole). 30 tablet 1  . lidocaine  (XYLOCAINE) 2 % solution Patient: Mix 1part 2% viscous lidocaine, 1part H20. Swish & swallow 46mL of diluted mixture, 50min before meals and at bedtime, up to QID 200 mL 4  . lidocaine-prilocaine (EMLA) cream Apply 1 application topically as needed. (Patient not taking: Reported on 08/05/2020) 30 g 0  . nitroGLYCERIN (NITROSTAT) 0.4 MG SL tablet Place 1 tablet (0.4 mg total) under the tongue every 5 (five) minutes as needed. Place 1 tablet under tongue for chest pain -- not to exceed 3 in 15 minutes (Patient not taking: Reported on 07/31/2020) 100 tablet 11  . nortriptyline (PAMELOR) 10 MG capsule Take 1 capsule every night for 1 week, then increase to 2 capsules every night (Patient not taking: Reported on 08/05/2020) 60 capsule 11  . ondansetron (ZOFRAN ODT) 4 MG disintegrating tablet Take 1 tablet (4 mg total) by mouth every 8 (eight) hours as needed for nausea or vomiting. (Patient not taking: Reported on 08/05/2020) 20 tablet 0  . oxyCODONE (ROXICODONE) 5 MG/5ML solution Place 5 mLs (5 mg total) into feeding tube every 4 (four) hours as needed for severe pain. 240 mL 0  . prochlorperazine (COMPAZINE) 10 MG tablet Take 1 tablet (10 mg total) by mouth every 6 (six) hours as needed for nausea or vomiting. (Patient not taking: Reported on 07/31/2020) 30 tablet 0  . promethazine (PHENERGAN) 6.25 MG/5ML syrup 6.25 to 12.5 mg Q 6 hours VT prn nausea 240 mL 2  . rOPINIRole (REQUIP) 0.25 MG tablet TAKE 1 TABLET BY MOUTH 2 HOURS BEFORE  BEDTIME (Patient not taking: Reported on 08/05/2020) 90 tablet 3  . tiZANidine (ZANAFLEX) 4 MG tablet Take 1 tablet (4 mg total) by mouth at bedtime. (Patient not taking: Reported on 08/05/2020) 30 tablet 11  . traMADol (ULTRAM) 50 MG tablet TAKE 1 TABLET BY MOUTH EVERY NIGHT (Patient not taking: Reported on 08/05/2020) 90 tablet 3   No current facility-administered medications for this visit.   Facility-Administered Medications Ordered in Other Visits  Medication Dose Route Frequency  Provider Last Rate Last Admin  . heparin lock flush 100 unit/mL  250 Units Intracatheter Once PRN Wyatt Portela, MD      . sodium chloride flush (NS) 0.9 % injection 10 mL  10 mL Intracatheter Once PRN Wyatt Portela, MD      . sodium chloride flush (NS) 0.9 % injection 10 mL  10 mL Intracatheter PRN Wyatt Portela, MD   10 mL at 07/30/20 1520     Allergies: No Known Allergies   Physical Exam:  Blood pressure 124/88, pulse 85, temperature 97.6 F (36.4 C), resp. rate 18, weight 113 lb 12.8 oz (51.6 kg), SpO2 97 %.    ECOG: 1     General appearance: Alert, awake without any distress. Head: Atraumatic without abnormalities Oropharynx: Without any thrush or ulcers. Eyes: No scleral icterus. Lymph nodes: No lymphadenopathy noted in the cervical, supraclavicular, or axillary nodes Heart:regular rate and rhythm, without any murmurs or gallops.   Lung: Clear to auscultation without any rhonchi, wheezes or dullness to percussion. Abdomin: Soft, nontender without any shifting dullness or ascites. Musculoskeletal: No clubbing or cyanosis. Neurological: No motor or sensory deficits. Skin: No rashes or lesions.     Lab Results: Lab Results  Component Value Date   WBC 11.9 (H) 08/05/2020   HGB 14.3 08/05/2020   HCT 41.8 08/05/2020   MCV 91.5 08/05/2020   PLT 338 08/05/2020     Chemistry      Component Value Date/Time   NA 130 (L) 08/05/2020 1140   K 4.3 08/05/2020 1140   CL 93 (L) 08/05/2020 1140   CO2 28 08/05/2020 1140   BUN 18 08/05/2020 1140   CREATININE 0.74 08/05/2020 1140   CREATININE 1.00 06/27/2015 1222      Component Value Date/Time   CALCIUM 9.0 08/05/2020 1140   ALKPHOS 102 08/05/2020 1140   AST 19 08/05/2020 1140   ALT 21 08/05/2020 1140   BILITOT 0.7 08/05/2020 1140         Impression and Plan:  1.  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    Continues to tolerate therapy currently receiving radiation with weekly cisplatin.  Risks and benefits of continuing this treatment were reviewed.  Laboratory data continues to show normal kidney function and hematological parameters.  Electrolyte imbalance and hyponatremia also risk associated with this treatment.  For the time being we will continue this approach.  2.  IV access: Port-A-Cath continues to be in use without any issues.  3.  Nutrition: Continues to receive adequate nutrition via feeding tube.  4.  Antiemetics: Compazine is available to him without any nausea or vomiting.  5.  Renal function surveillance: Creatinine remains within normal range.  We will continue to monitor on platinum therapy.  6.  Pain: Fentanyl patch currently at 50 mcg with breakthrough liquid oxycodone.  Pain is manageable and we will not adjust any dosage  at this time.   7.    Hyponatremia: Multifactorial in nature could be dehydration related versus SIADH related to cisplatin therapy.  I recommended continuing cisplatin therapy and continued aggressive normal saline hydration to improve his sodium.  He will continue to receive normal saline weekly.    8.  Follow-up: He will receive intravenous hydration this week continue to follow weekly for cisplatin and hydration support on a weekly basis.  30  minutes were dedicated to this visit.  The time was spent on reviewing his disease status, reviewing laboratory data and addressing complication related to his cancer and cancer therapy.    Zola Button, MD 9/14/20218:16 AM

## 2020-08-13 ENCOUNTER — Other Ambulatory Visit: Payer: Self-pay | Admitting: Medical

## 2020-08-13 ENCOUNTER — Other Ambulatory Visit: Payer: Self-pay

## 2020-08-13 ENCOUNTER — Ambulatory Visit
Admission: RE | Admit: 2020-08-13 | Discharge: 2020-08-13 | Disposition: A | Discharge status: Home / Self Care | Payer: Medicare HMO | Source: Ambulatory Visit | Attending: Radiation Oncology | Admitting: Radiation Oncology

## 2020-08-13 DIAGNOSIS — G893 Neoplasm related pain (acute) (chronic): Secondary | ICD-10-CM

## 2020-08-13 DIAGNOSIS — C111 Malignant neoplasm of posterior wall of nasopharynx: Secondary | ICD-10-CM | POA: Diagnosis not present

## 2020-08-14 ENCOUNTER — Ambulatory Visit
Admission: RE | Admit: 2020-08-14 | Discharge: 2020-08-14 | Disposition: A | Discharge status: Home / Self Care | Payer: Medicare HMO | Source: Ambulatory Visit | Attending: Radiation Oncology | Admitting: Radiation Oncology

## 2020-08-14 ENCOUNTER — Other Ambulatory Visit: Payer: Self-pay

## 2020-08-14 ENCOUNTER — Ambulatory Visit: Payer: Medicare HMO | Attending: Radiation Oncology

## 2020-08-14 ENCOUNTER — Ambulatory Visit: Payer: Medicare HMO | Admitting: Physical Therapy

## 2020-08-14 ENCOUNTER — Telehealth: Payer: Self-pay

## 2020-08-14 ENCOUNTER — Encounter: Payer: Self-pay | Admitting: Physical Therapy

## 2020-08-14 ENCOUNTER — Other Ambulatory Visit: Payer: Self-pay | Admitting: Oncology

## 2020-08-14 ENCOUNTER — Encounter: Payer: Self-pay | Admitting: General Practice

## 2020-08-14 DIAGNOSIS — R1310 Dysphagia, unspecified: Secondary | ICD-10-CM | POA: Diagnosis present

## 2020-08-14 DIAGNOSIS — R293 Abnormal posture: Secondary | ICD-10-CM | POA: Insufficient documentation

## 2020-08-14 DIAGNOSIS — C119 Malignant neoplasm of nasopharynx, unspecified: Secondary | ICD-10-CM | POA: Diagnosis present

## 2020-08-14 DIAGNOSIS — C111 Malignant neoplasm of posterior wall of nasopharynx: Secondary | ICD-10-CM | POA: Diagnosis not present

## 2020-08-14 DIAGNOSIS — G893 Neoplasm related pain (acute) (chronic): Secondary | ICD-10-CM

## 2020-08-14 MED ORDER — OXYCODONE HCL 5 MG/5ML PO SOLN: 5.0000 mg | ORAL | 0 refills | Status: DC | PRN

## 2020-08-14 MED FILL — oxyCODONE HCL 5 MG/5ML SOLN: 5 | 12 days supply | Qty: 240 | Fill #0

## 2020-08-14 NOTE — Patient Instructions (Signed)
SWALLOWING EXERCISES Do these until 6 months after your last day of radiation, then 2-3 times per week afterwards  1. Effortful Swallows - Press your tongue against the roof of your mouth for 3 seconds, then squeeze the muscles in your neck while you swallow your saliva or a sip of water - Repeat 10-15 times, 2-3 times a day, and use whenever you eat or drink  2. Pitch Raise - Repeat "he", once per second in as high of a pitch as you can - Repeat 20 times, 2-3 times a day  3. Chin pushback - Open your mouth  - Place your fist UNDER your chin near your neck - Tuck your chin and push back with your fist for 5 seconds - Repeat 10 times, 2-3 times a day        4.  Blowing exercise  - blow hard like you are blowing out birthday candles  - Repeat 10 times, 2-3 times a day        5. "Super Swallow"  - Take a breath and hold it  - Bear down (like pushing your bowels)  - Swallow then IMMEDIATELY cough  - Repeat 10 times, 2-3 times a day

## 2020-08-14 NOTE — Telephone Encounter (Signed)
Called to have patient's oxycodone refill. Dr Alen Blew sent refill in to pharmacy.

## 2020-08-14 NOTE — Progress Notes (Signed)
[place] CSW Progress Notes  Visit w patient in exam room during Head and Neck MDC.  Patient seated in wheelchair accompanied by brother in law.  Per patient and brother in law, he now uses walker and/or wheelchair to ambulate.  He also has a vertebral fracture which causes significant pain.  He is a man of few words, but responds to dkrect questions.  The last several months have been very disruptive to patient - he has gone from independence to significant dependence on others and has significant pain issues.  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  He is now unable to do either of these activities that previously brought him pleasure.  When asked if he has any "joy" in life now, he responds "no."  Discussed things we can control vs things we cannot control. Much of life seems out of control at this point.  Discussed ways to reengage his creative side in ways that are possible now - encouraged participation in [employer] activities.  Has very supportive family who accompany him to appointments.  Has limited functional and computer literacy, but family is able/willing to help him connect w supportive services.  Will see him in two weeks to assess progress.  Edwyna Shell, LCSW Clinical Social Worker Phone:  [phone]

## 2020-08-14 NOTE — Progress Notes (Signed)
Oncology Nurse Navigator Documentation  I met with Willie Kennedy and his brother-in-law before [facility] clinic today. He voiced that he has been managing as well as he can. He is feeling slightly better regarding pain. He is still not able to move around as well because of the pain. He denies any acute concerns at this time and he and his family know to call me if they have further concerns or needs that I can help them with.  Harlow Asa RN, BSN, OCN Head & Neck Oncology Nurse [place] at [facility] Phone # [phone]  Fax # [phone]

## 2020-08-14 NOTE — Therapy (Addendum)
Willie [place], [place], [address] Phone: [phone]   Fax:  [phone]  Physical Therapy Evaluation  Patient Details  Name: Willie Kennedy MRN: [id number] Date of Birth: 1951/10/30 Referring Provider (PT): Reita May Date: 08/14/2020   PT End of Session - 08/14/20 0923    Visit Number 1    Number of Visits 2    Date for PT Re-Evaluation 10/09/20    PT Start Time 0850    PT Stop Time 0916    PT Time Calculation (min) 26 min    Activity Tolerance Patient tolerated treatment well    Behavior During Therapy [facility] for tasks assessed/performed           Past Medical History:  Diagnosis Date  . Biceps tendon rupture    h/o Right biceps tendon rupture  . CAD (coronary artery disease)    a. ETT myoview (10/11) with EF 52%, reversible inferior perfusion defect, 1 mm inferolateral ST depression with exercise, 7"16" and stopped due to dyspnea and leg weakness -> LHC (10/11), EF 55%, 40% pRCA, 40% mRCA, 40% prox to mid CFX. medical treatment.  . Kennedy ([employer])    skin Kennedy behind left ear   . Dizziness    with bending over   . Hard of hearing    more per right ear  . Headache    with bright light   . History of bronchitis   . History of hiatal hernia   . Hyperlipidemia   . Impaired glucose tolerance   . Numbness and tingling in left arm    if holds arm upward too long pt states his primary MD is aware  . Osteoarthritis   . Peripheral vascular disease ([place]) 2005   a. s/p left fem-pop Dr.Lawson in 2005.  Marland Kitchen Shortness of breath dyspnea    walking distances or climbing stairs  . Tinnitus     Past Surgical History:  Procedure Laterality Date  . ds/p left femoral artery occlusion  2005   s/p left fem-pop bypass- Dr. Kellie Simmering  . HERNIA REPAIR    . IR GASTROSTOMY TUBE MOD SED  07/24/2020  . IR IMAGING GUIDED PORT INSERTION  07/24/2020  . MULTIPLE EXTRACTIONS WITH ALVEOLOPLASTY N/A 07/17/2020    Procedure: Extraction of tooth #'s 9,70,26,37,CHY 31 with alveoloplasty;  Surgeon: Lenn Cal, DDS;  Location: [place];  Service: Oral Surgery;  Laterality: N/A;  . MYRINGOTOMY WITH TUBE PLACEMENT Right 06/26/2020   Procedure: MYRINGOTOMY WITH TUBE PLACEMENT RIGHT  EAR;  Surgeon: Melida Quitter, MD;  Location: [place];  Service: ENT;  Laterality: Right;  . NASAL ENDOSCOPY N/A 06/26/2020   Procedure: NASAL ENDOSCOPY W/NASOPHARYNGEAL BIOPSY;  Surgeon: Melida Quitter, MD;  Location: [place];  Service: ENT;  Laterality: N/A;  . PR VEIN BYPASS GRAFT,AORTO-FEM-POP    . s/p right bicep tendon rupture repair    . VENTRAL HERNIA REPAIR N/A 08/22/2015   Procedure: OPEN VENTRAL HERNIA REPAIR WITH MESH;  Surgeon: Jackolyn Confer, MD;  Location: [facility];  Service: General;  Laterality: N/A;    There were no vitals filed for this visit.    Subjective Assessment - 08/14/20 0918    Subjective My neck hurts.    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 Yes    Pain Score 7     Pain Location Neck    Pain Orientation Right    Pain Descriptors / Indicators Other (Comment)   pt reports it feels like it is full of water, unable to describe   Pain Type Chronic pain    Pain Onset More than a month ago    Pain Frequency Constant    Aggravating Factors  nothing    Pain Relieving Factors laying on that side    Effect of Pain on Daily Activities unable to move neck              [facility] PT Assessment - 08/14/20 0001      Assessment   Medical Diagnosis R nasopharyngeal Kennedy    Referring Provider (PT) Isidore Moos    Onset  Date/Surgical Date 06/26/20    Hand Dominance Right    Prior Therapy none      Precautions   Precautions Other (comment)    Precaution Comments active Kennedy      Restrictions   Weight Bearing Restrictions No      Balance Screen   Has the patient fallen in the past 6 months No    Has the patient had a decrease in activity level because of a fear of falling?  No    Is the patient reluctant to leave their home because of a fear of falling?  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    Leisure does not exercise      Cognition   Overall Cognitive Status Within Functional Limits for tasks assessed      Observation/Other Assessments   Observations seated in wheelchair      Functional Tests   Functional tests Sit to Stand      Sit to Stand   Comments not done secondary to neck pain      Posture/Postural Control   Posture/Postural Control Postural limitations    Postural Limitations Rounded Shoulders;Forward head;Increased thoracic kyphosis      ROM / Strength   AROM / PROM / Strength AROM      AROM   Overall AROM  Deficits    Overall AROM Comments unable to assess cervical ROM because pt has 10/10 pain with any movement due to recent neck injury, shoulder ROM is 50% limited with flexion and abduction      Ambulation/Gait   Ambulation/Gait Yes    Ambulation/Gait Assistance 4: Min guard    Ambulation Distance (Feet) 10 Feet    Gait Pattern Decreased arm swing - right;Decreased arm swing -  left;Decreased step length - right;Decreased step length - left;Decreased hip/knee flexion - right;Decreased hip/knee flexion - left;Decreased dorsiflexion - right;Decreased dorsiflexion - left;Right foot flat;Left foot flat;Decreased trunk rotation;Poor foot clearance - left;Poor foot clearance - right    Ambulation Surface Level  LYMPHEDEMA/ONCOLOGY QUESTIONNAIRE - 08/14/20 0001      Lymphedema Assessments   Lymphedema Assessments Head and Neck      Head and Neck   4 cm superior to sternal notch around neck 33.4 cm    6 cm superior to sternal notch around neck 34 cm    8 cm superior to sternal notch around neck 34.3 cm                   Objective measurements completed on examination: See above findings.               PT Education - 08/14/20 0921    Education Details Neck ROM, importance of posture when sitting, standing and lying down, deep breathing, walking program and importance of staying active throughout treatment, CURE article on staying active, "Why exercise?" flyer, lymphedema and PT info    Person(s) Educated Patient;Other (comment)   brother in law   Methods Explanation;Handout    Comprehension Verbalized understanding               PT Long Term Goals - 08/14/20 0927      PT LONG TERM GOAL #1   Title Pt will return to baseline ROM measurements and report decreased neck pain to allow pt to return to being a caregiver for his disabled wife.    Baseline 7/10 neck pain, unable to move neck due to pain    Time 8    Period Weeks    Status New    Target Date 10/09/20              Head and Neck Clinic Goals - 08/14/20 7867      Patient will be able to verbalize understanding of a home exercise program for cervical range of motion, posture, and walking.    Status Achieved      Patient will be able to verbalize understanding of proper sitting and standing posture.    Status Achieved      Patient will be able to  verbalize understanding of lymphedema risk and availability of treatment for this condition.    Status Achieved              Plan - 08/14/20 0929    Clinical Impression Statement Pt presents to head and neck clinic with recently diagnosed nasopharyngeal Kennedy. Around the time of his diagnosis he had an accident on his lawn mower that resulted in whiplash and a cracked cervical vertebrae per pt. He has a neck brace that he wears most of the time. He is unable to move his neck at all without 10/10 pain so ROM was not tested today. Bilateral shoulder ROM was 50% limited. Pt only able to raise UE's 90 degrees. He reports it increases his right neck pain when raising his shoulders. Issued ROM exercises to pt but educated him to allow his neck to heal and not to move in to pain. Also educated pt on importance of a walking program even if for short distances inside using his walker. He currently is unsteady with gait and has decreased knee and hip flexion. 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 Ecucated pt in signs of symptoms of lymphedema. Pt reports so minimal swelling at anterior neck currently. Will see pt for a follow up visit in 8 weeks to reassess ROM and begin PT once his radiation is complete allowing his  neck more time to heal.    Personal Factors and Comorbidities Fitness;Social Background   pt unable to read, recent lawn mower accident with cracked cervical vertebrae   Examination-Activity Limitations Lift    Examination-Participation Restrictions Occupation;Yard Work;Community Activity   caregiver for wife who is disabled   Stability/Clinical Decision Making Evolving/Moderate complexity    Clinical Decision Making Moderate    Rehab Potential Good    PT Frequency --   eval and 1 f/u visit   PT Treatment/Interventions ADLs/Self Care Home Management;Therapeutic exercise;Patient/family education    PT Next Visit  Plan reassess baselines    PT Home Exercise Plan head and neck exercises in pain free range once neck is able to move without pain    Consulted and Agree with Plan of Care Patient           Patient will benefit from skilled therapeutic intervention in order to improve the following deficits and impairments:  Pain, Postural dysfunction, Impaired UE functional use, Decreased strength, Decreased activity tolerance, Increased edema, Decreased balance, Decreased mobility, Difficulty walking  Visit Diagnosis: Abnormal posture - Plan: PT plan of care cert/re-cert  Malignant neoplasm of nasopharyngeal wall (HCC) - Plan: PT plan of care cert/re-cert     Problem List Patient Active Problem List   Diagnosis Date Noted  . Carcinoma of posterior wall of Willie ([place]) 07/18/2020  . 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 Neuropathy 07/11/2019  . DJD (degenerative joint disease) 07/11/2019  . Decreased ROM of left elbow 10/03/2017  . Pain in left wrist 09/26/2017  . Biceps rupture, distal, left, initial encounter 06/27/2017  . Neuropathic pain 05/09/2017  . Complicated migraine 69/45/0388  . Restless leg syndrome 02/23/2017  . Transient alteration of awareness 08/06/2016  . Hyperreflexia 08/06/2016  . Muscle cramps 08/06/2016  . Left leg pain 02/06/2016  . Impacted ear wax 07/04/2015  . Hearing loss of both ears 07/04/2015  . Ventral hernia 05/16/2015  . Nocturnal leg cramps 05/16/2015  . Bruit 09/20/2014  . Chest pain 09/20/2014  . Aftercare following surgery of the circulatory system, [place] 08/07/2014  . Right elbow pain 01/10/2014  . Right leg pain 06/18/2013  . Goals of care, counseling/discussion 05/04/2013  . Impaired glucose tolerance 05/04/2013  . CAD, NATIVE VESSEL 10/01/2010  . Nonspecific abnormal electrocardiogram (ECG) (EKG) 09/04/2010  . Hyperlipidemia 08/27/2010  . OSTEOARTHRITIS, HANDS, BILATERAL 08/27/2010  . Atherosclerosis of native arteries of extremity with  intermittent claudication ([place]) 09/06/2008    Allyson Sabal [facility] 08/14/2020, 10:38 AM  [place], [place], [address] Phone: [phone]   Fax:  [phone]  Name: BRAYLYN KALTER MRN: [id number] Date of Birth: [birth year]  Manus Gunning, PT 08/14/20 10:38 AM

## 2020-08-14 NOTE — Therapy (Signed)
[place] [address] [place], [place], [address] Phone: [phone]   Fax:  [phone]  Speech Language Pathology Evaluation  Patient Details  Name: Willie Kennedy MRN: [id number] Date of Birth: 09/14/1951 Referring Provider (SLP): Eppie [place], MD   Encounter Date: 08/14/2020   End of Session - 08/14/20 2237    Visit Number 1    Number of Visits 7    Date for SLP Re-Evaluation 11/12/20    Authorization Type [employer] - visits requested    SLP Start Time [phone]    SLP Stop Time  1030    SLP Time Calculation (min) 32 min    Activity Tolerance Patient tolerated treatment well           Past Medical History:  Diagnosis Date  . Biceps tendon rupture    h/o Right biceps tendon rupture  . CAD (coronary artery disease)    a. ETT myoview (10/11) with EF 52%, reversible inferior perfusion defect, 1 mm inferolateral ST depression with exercise, 7"16" and stopped due to dyspnea and leg weakness -> LHC (10/11), EF 55%, 40% pRCA, 40% mRCA, 40% prox to mid CFX. medical treatment.  . Cancer ([place])    skin cancer behind left ear   . Dizziness    with bending over   . Hard of hearing    more per right ear  . Headache    with bright light   . History of bronchitis   . History of hiatal hernia   . Hyperlipidemia   . Impaired glucose tolerance   . Numbness and tingling in left arm    if holds arm upward too long pt states his primary MD is aware  . Osteoarthritis   . Peripheral vascular disease ([place]) 2005   a. s/p left fem-pop Dr.Lawson in 2005.  Marland Kitchen Shortness of breath dyspnea    walking distances or climbing stairs  . Tinnitus     Past Surgical History:  Procedure Laterality Date  . ds/p left femoral artery occlusion  2005   s/p left fem-pop bypass- Dr. Kellie Simmering  . HERNIA REPAIR    . IR GASTROSTOMY TUBE MOD SED  07/24/2020  . IR IMAGING GUIDED PORT INSERTION  07/24/2020  . MULTIPLE EXTRACTIONS WITH  ALVEOLOPLASTY N/A 07/17/2020   Procedure: Extraction of tooth #'s 0,25,42,70,WCB 31 with alveoloplasty;  Surgeon: Lenn Cal, DDS;  Location: [place];  Service: Oral Surgery;  Laterality: N/A;  . MYRINGOTOMY WITH TUBE PLACEMENT Right 06/26/2020   Procedure: MYRINGOTOMY WITH TUBE PLACEMENT RIGHT  EAR;  Surgeon: Melida Quitter, MD;  Location: [place];  Service: ENT;  Laterality: Right;  . NASAL ENDOSCOPY N/A 06/26/2020   Procedure: NASAL ENDOSCOPY W/NASOPHARYNGEAL BIOPSY;  Surgeon: Melida Quitter, MD;  Location: [place];  Service: ENT;  Laterality: N/A;  . PR VEIN BYPASS GRAFT,AORTO-FEM-POP    . s/p right bicep tendon rupture repair    . VENTRAL HERNIA REPAIR N/A 08/22/2015   Procedure: OPEN VENTRAL HERNIA REPAIR WITH MESH;  Surgeon: Jackolyn Confer, MD;  [facility];  Service: General;  Laterality: N/A;    There were no vitals filed for this visit.   Subjective Assessment - 08/14/20 1009    Subjective Pt frail looking male in wheelchair.    Patient is accompained by: Family member   brother in law   Currently in Pain? Yes    Pain Score 7     Pain Location Neck  Pain Orientation Right              SLP Evaluation [facility] - 08/14/20 1009      SLP Visit Information   SLP Received On 08/14/20    Referring Provider (SLP) Eppie [place], MD    Onset Date 2020    Medical Diagnosis Nasopharyngeal Cancer      Subjective   Patient/Family Stated Goal "get better"      General Information   HPI CT neck 06-04-20 due to rt sided facial pain for 12+ months. 06-26-20 rt myringotomy with T-tube placementand biopsy which revelaed invasive SCCA of rt nasopharynx. 07-07-20 PETSUV uptake in rt posteror lateral nasopharynxand erosion into rt occipital condyle and petrous apex. Began rad tx on 07-29-20 and chemo on 07-30-20. Remaining teeth extracted 07-17-20, PEG placed 07-24-20  Hx of remote lt parietal infarction.    Behavioral/Cognition very flat affect      Prior  Functional Status   Cognitive/Linguistic Baseline Within functional limits      Cognition   Overall Cognitive Status Within Functional Limits for tasks assessed      Auditory Comprehension   Overall Auditory Comprehension Appears within functional limits for tasks assessed      Motor Speech   Overall Motor Speech Impaired    Articulation Impaired   very mild due to endentulous status         Pt currently tolerates water but rarely has POs at this time, relying on his PEG tube "for a couple weeks.".  Oral motor assessment revealed reduced superior and lateral lingual ROM and reduced lingual strength. Labial ROM was WNL. Velar ROM appeared sluggish. POs: Pt drank water x5 sips without overt s/s aspiration. Thyroid elevation appeared reduced, and swallows appeared grossly timely. Pt's swallow deemed functional at this time.   Because data states the risk for dysphagia during and after radiation treatment is high due to undergoing radiation tx, SLP taught pt about the possibility of reduced/limited ability for PO intake during rad tx. SLP encouraged pt to continue swallowing POs as far into rad tx as possible, even ingesting POs and/or completing HEP shortly after administration of pain meds. Among other modifications for days when pt cannot functionally swallow, SLP talked about performing only non-swallowing tasks on the handout/HEP, and then adding swallowing tasks back in when it becomes possible to do so.  SLP educated pt re: changes to swallowing musculature after rad tx, and why adherence to dysphagia HEP provided today and PO consumption was necessary to inhibit muscular disuse atrophy and to reduce muscle fibrosis following rad tx. Pt demonstrated understanding of these things to SLP.    SLP then developed a HEP for pt and pt was instructed how to perform exercises involving lingual, vocal, and pharyngeal strengthening. SLP performed each exercise and pt return demonstrated each exercise.  SLP ensured pt performance was correct prior to moving on to next exercise. Pt was instructed to complete this program 2 times a day, 7 days/week until 6 months after his last rad tx, then x2 a week after that.                  SLP Education - 08/14/20 2236    Education Details HEP procedure, late effects head/neck radiation on swallowing function    Person(s) Educated Patient;Caregiver(s)   brother in law   Methods Explanation;Demonstration;Verbal cues;Handout    Comprehension Verbalized understanding;Returned demonstration;Verbal cues required;Need further instruction            SLP  Short Term Goals - 08/14/20 2245      SLP SHORT TERM GOAL #1   Title pt will complete HEP with rare min A    Time 2    Period --   visits, for all STGs   Status New      SLP SHORT TERM GOAL #2   Title pt will tell SLP why pt is completing HEP with modified independence    Time 2    Status New      SLP SHORT TERM GOAL #3   Title pt will describe 3 overt s/s aspiration PNA with modified independence    Time 2    Status New      SLP SHORT TERM GOAL #4   Title pt will tell SLP how a food journal could hasten return to a more normalized diet    Time 3    Status New            SLP Long Term Goals - 08/14/20 2246      SLP LONG TERM GOAL #1   Title pt will complete HEP with modified independence over two visits    Time 4    Period --   visits, for all LTGs   Status New      SLP LONG TERM GOAL #2   Title pt will tell SLP why pt is completing HEP with modified independence over three visits    Time 4    Status New      SLP LONG TERM GOAL #3   Title pt will describe how to modify HEP over time, and the timeline associated with reduction in HEP frequency with modified independence over two sessions    Time 6    Status New            Plan - 08/14/20 2239    Clinical Impression Statement Pt presents today with functional/normal swallow for liquids x5 sips. Pt reported  odynophagia rated 5/10 when swallowing. SLP strongly encouraged Bransen to swallow at least 4-5 swallows 4-5 times a day until he is able to tolerate more sips/PO. He politely refused applesauce.SLP designed an individualized HEP for dysphagia and pt completed each exercise on their own with occasional min-mod cues. There are no overt s/s aspiration reported by pt at this time. Data indicate that pt's swallow ability will likely decrease over the course of radiation therapy and could very well decline over time following conclusion of their radiation therapy due to muscle disuse atrophy and/or muscle fibrosis. Pt will cont to need to be seen by SLP in order to assess safety of PO intake, assess the need for recommending any objective swallow assessment, and ensuring pt correctly completes the individualized HEP.    Speech Therapy Frequency --   once every approx 4 weeks   Duration --   total 7 visits   Treatment/Interventions Aspiration precaution training;Pharyngeal strengthening exercises;Diet toleration management by SLP;Trials of upgraded texture/liquids;Patient/family education;SLP instruction and feedback;Compensatory strategies;Internal/external aids    Potential to Achieve Goals Fair    Potential Considerations Cooperation/participation level   ? depression   SLP Home Exercise Plan provided today    Consulted and Agree with Plan of Care Patient           Patient will benefit from skilled therapeutic intervention in order to improve the following deficits and impairments:   Dysphagia, unspecified type    Problem List Patient Active Problem List   Diagnosis Date Noted  . Carcinoma of posterior wall  of nasopharynx ([place]) 07/18/2020  . Nasopharyngeal cancer ([place]) 07/08/2020  . Neuropathy 07/11/2019  . DJD (degenerative joint disease) 07/11/2019  . Decreased ROM of left elbow 10/03/2017  . Pain in left wrist 09/26/2017  . Biceps rupture, distal, left, initial encounter 06/27/2017  .  Neuropathic pain 05/09/2017  . Complicated migraine 66/04/44  . Restless leg syndrome 02/23/2017  . Transient alteration of awareness 08/06/2016  . Hyperreflexia 08/06/2016  . Muscle cramps 08/06/2016  . Left leg pain 02/06/2016  . Impacted ear wax 07/04/2015  . Hearing loss of both ears 07/04/2015  . Ventral hernia 05/16/2015  . Nocturnal leg cramps 05/16/2015  . Bruit 09/20/2014  . Chest pain 09/20/2014  . Aftercare following surgery of the circulatory system, [place] 08/07/2014  . Right elbow pain 01/10/2014  . Right leg pain 06/18/2013  . Goals of care, counseling/discussion 05/04/2013  . Impaired glucose tolerance 05/04/2013  . CAD, NATIVE VESSEL 10/01/2010  . Nonspecific abnormal electrocardiogram (ECG) (EKG) 09/04/2010  . Hyperlipidemia 08/27/2010  . OSTEOARTHRITIS, HANDS, BILATERAL 08/27/2010  . Atherosclerosis of native arteries of extremity with intermittent claudication ([place]) 09/06/2008    [place] ,[place], [place]  08/14/2020, 10:54 PM  [facility] [address] [place], [place], [address] Phone: [phone]   Fax:  [phone]  Name: JOHNROSS NABOZNY MRN: [id number] Date of Birth: 06-30-1951

## 2020-08-15 ENCOUNTER — Inpatient Hospital Stay: Payer: Medicare HMO

## 2020-08-15 ENCOUNTER — Other Ambulatory Visit: Payer: Self-pay

## 2020-08-15 ENCOUNTER — Ambulatory Visit
Admission: RE | Admit: 2020-08-15 | Discharge: 2020-08-15 | Disposition: A | Discharge status: Home / Self Care | Payer: Medicare HMO | Source: Ambulatory Visit | Attending: Radiation Oncology | Admitting: Radiation Oncology

## 2020-08-15 VITALS — BP 120/85 | HR 82 | Temp 98.2°F | Resp 18

## 2020-08-15 DIAGNOSIS — C119 Malignant neoplasm of nasopharynx, unspecified: Secondary | ICD-10-CM

## 2020-08-15 DIAGNOSIS — C111 Malignant neoplasm of posterior wall of nasopharynx: Secondary | ICD-10-CM | POA: Diagnosis not present

## 2020-08-15 DIAGNOSIS — Z5111 Encounter for antineoplastic chemotherapy: Secondary | ICD-10-CM | POA: Diagnosis not present

## 2020-08-15 MED ORDER — SODIUM CHLORIDE 0.9 % IV SOLN
Freq: Once | INTRAVENOUS | Status: AC
  Filled 2020-08-15: qty 250

## 2020-08-15 MED ORDER — HEPARIN SOD (PORK) LOCK FLUSH 100 UNIT/ML IV SOLN
500.0000 [IU] | Freq: Once | INTRAVENOUS | Status: DC | PRN
  Filled 2020-08-15: qty 5

## 2020-08-15 MED ORDER — SODIUM CHLORIDE 0.9% FLUSH
10.0000 mL | Freq: Once | INTRAVENOUS | Status: DC | PRN
  Filled 2020-08-15: qty 10

## 2020-08-15 MED FILL — PROMETHAZINE 6.25 MG/5 ML S: 6.25 | 6 days supply | Qty: 240 | Fill #1

## 2020-08-15 NOTE — Patient Instructions (Signed)

## 2020-08-18 ENCOUNTER — Other Ambulatory Visit: Payer: Medicare HMO

## 2020-08-18 ENCOUNTER — Other Ambulatory Visit: Payer: Self-pay | Admitting: Radiation Oncology

## 2020-08-18 ENCOUNTER — Other Ambulatory Visit: Payer: Self-pay

## 2020-08-18 ENCOUNTER — Ambulatory Visit
Admission: RE | Admit: 2020-08-18 | Discharge: 2020-08-18 | Disposition: A | Discharge status: Home / Self Care | Payer: Medicare HMO | Source: Ambulatory Visit | Attending: Radiation Oncology | Admitting: Radiation Oncology

## 2020-08-18 ENCOUNTER — Inpatient Hospital Stay (HOSPITAL_BASED_OUTPATIENT_CLINIC_OR_DEPARTMENT_OTHER): Payer: Medicare HMO | Admitting: Medical

## 2020-08-18 ENCOUNTER — Inpatient Hospital Stay: Payer: Medicare HMO

## 2020-08-18 VITALS — BP 160/76 | HR 64 | Temp 98.0°F | Resp 20

## 2020-08-18 DIAGNOSIS — C119 Malignant neoplasm of nasopharynx, unspecified: Secondary | ICD-10-CM

## 2020-08-18 DIAGNOSIS — R112 Nausea with vomiting, unspecified: Secondary | ICD-10-CM

## 2020-08-18 DIAGNOSIS — Z5111 Encounter for antineoplastic chemotherapy: Secondary | ICD-10-CM | POA: Diagnosis not present

## 2020-08-18 DIAGNOSIS — R110 Nausea: Secondary | ICD-10-CM | POA: Diagnosis not present

## 2020-08-18 DIAGNOSIS — C111 Malignant neoplasm of posterior wall of nasopharynx: Secondary | ICD-10-CM | POA: Diagnosis not present

## 2020-08-18 MED ORDER — SODIUM CHLORIDE 0.9% FLUSH
10.0000 mL | Freq: Once | INTRAVENOUS | Status: AC | PRN
  Administered 2020-08-18: 10 mL
  Filled 2020-08-18: qty 10

## 2020-08-18 MED ORDER — PROMETHAZINE HCL 6.25 MG/5ML PO SYRP: ORAL_SOLUTION | ORAL | 2 refills | Status: DC

## 2020-08-18 MED ORDER — HEPARIN SOD (PORK) LOCK FLUSH 100 UNIT/ML IV SOLN
500.0000 [IU] | Freq: Once | INTRAVENOUS | Status: AC | PRN
  Administered 2020-08-18: 500 [IU]
  Filled 2020-08-18: qty 5

## 2020-08-18 MED ORDER — SODIUM CHLORIDE 0.9 % IV SOLN
Freq: Once | INTRAVENOUS | Status: AC
  Filled 2020-08-18: qty 250

## 2020-08-18 MED ORDER — ONDANSETRON HCL 4 MG/5ML PO SOLN: 6.0000 mg | Freq: Three times a day (TID) | ORAL | 5 refills | Status: DC | PRN

## 2020-08-18 MED ORDER — ONDANSETRON HCL 4 MG/2ML IJ SOLN
INTRAMUSCULAR | Status: AC
  Filled 2020-08-18: qty 4

## 2020-08-18 MED ORDER — METOCLOPRAMIDE HCL 5 MG/5ML PO SOLN: 5.0000 mg | Freq: Three times a day (TID) | ORAL | 2 refills | Status: DC

## 2020-08-18 MED ORDER — ONDANSETRON HCL 4 MG/2ML IJ SOLN
8.0000 mg | Freq: Once | INTRAMUSCULAR | Status: AC
  Administered 2020-08-18: 8 mg via INTRAVENOUS

## 2020-08-18 MED FILL — METOCLOPRAMIDE 5 MG/5 ML SY: 10 | 16 days supply | Qty: 240 | Fill #0

## 2020-08-18 NOTE — Patient Instructions (Signed)

## 2020-08-18 NOTE — Telephone Encounter (Signed)
Willie Kennedy, Looks like another one to refuse but this time [place] refilled and you got the request for refill.  Thanks! Threasa Beards

## 2020-08-18 NOTE — Progress Notes (Signed)
Patient feeling weaker and more fatigued when assessed during PUT appointment. Reports he is not able to eat/drink anything by mouth, and vomits after every PEG tube feeding. Dr. Isidore Moos asked that patient be evaluated by Alison Murray in [facility]. Reached out to Julien Girt who said there was apace available. CBC and CMP orders placed as well as IV Zofran orders under signed and held (patient already had IVF orders under supportive care treatment plan). High priority scheduling message sent to make lab and [facility] appointment. Patient escorted upstairs via wheelchair by NT.  Vitals:   08/18/20 1100  BP: 125/75  Pulse: 71  Resp: 20  Temp: 98 F (36.7 C)  SpO2: 98%

## 2020-08-19 ENCOUNTER — Inpatient Hospital Stay: Payer: Medicare HMO

## 2020-08-19 ENCOUNTER — Other Ambulatory Visit: Payer: Self-pay

## 2020-08-19 ENCOUNTER — Ambulatory Visit
Admission: RE | Admit: 2020-08-19 | Discharge: 2020-08-19 | Disposition: A | Discharge status: Home / Self Care | Payer: Medicare HMO | Source: Ambulatory Visit | Attending: Radiation Oncology | Admitting: Radiation Oncology

## 2020-08-19 ENCOUNTER — Inpatient Hospital Stay: Payer: Medicare HMO | Admitting: Nutrition

## 2020-08-19 VITALS — BP 143/77 | HR 75 | Temp 98.2°F | Resp 18 | Wt 115.5 lb

## 2020-08-19 DIAGNOSIS — C111 Malignant neoplasm of posterior wall of nasopharynx: Secondary | ICD-10-CM | POA: Diagnosis not present

## 2020-08-19 DIAGNOSIS — C119 Malignant neoplasm of nasopharynx, unspecified: Secondary | ICD-10-CM

## 2020-08-19 DIAGNOSIS — Z5111 Encounter for antineoplastic chemotherapy: Secondary | ICD-10-CM | POA: Diagnosis not present

## 2020-08-19 LAB — CMP (CANCER CENTER ONLY)
ALT: 56 U/L — ABNORMAL HIGH (ref 0–44)
AST: 24 U/L (ref 15–41)
Albumin: 2.8 g/dL — ABNORMAL LOW (ref 3.5–5.0)
Alkaline Phosphatase: 87 U/L (ref 38–126)
Anion gap: 9 (ref 5–15)
BUN: 15 mg/dL (ref 8–23)
CO2: 28 mmol/L (ref 22–32)
Calcium: 8.2 mg/dL — ABNORMAL LOW (ref 8.9–10.3)
Chloride: 92 mmol/L — ABNORMAL LOW (ref 98–111)
Creatinine: 0.8 mg/dL (ref 0.61–1.24)
GFR, Est AFR Am: >60 mL/min (ref >60)
GFR, Estimated: >60 mL/min (ref >60)
Glucose, Bld: 162 mg/dL — ABNORMAL HIGH (ref 70–99)
Potassium: 4 mmol/L (ref 3.5–5.1)
Sodium: 129 mmol/L — ABNORMAL LOW (ref 135–145)
Total Bilirubin: 0.5 mg/dL (ref 0.3–1.2)
Total Protein: 5.9 g/dL — ABNORMAL LOW (ref 6.5–8.1)

## 2020-08-19 LAB — CBC WITH DIFFERENTIAL (CANCER CENTER ONLY)
Abs Immature Granulocytes: 0.01 10*3/uL (ref 0.00–0.07)
Basophils Absolute: 0 10*3/uL (ref 0.0–0.1)
Basophils Relative: 0 %
Eosinophils Absolute: 0 10*3/uL (ref 0.0–0.5)
Eosinophils Relative: 0 %
HCT: 36 % — ABNORMAL LOW (ref 39.0–52.0)
Hemoglobin: 12.4 g/dL — ABNORMAL LOW (ref 13.0–17.0)
Immature Granulocytes: 0 %
Lymphocytes Relative: 10 %
Lymphs Abs: 0.5 10*3/uL — ABNORMAL LOW (ref 0.7–4.0)
MCH: 31.5 pg (ref 26.0–34.0)
MCHC: 34.4 g/dL (ref 30.0–36.0)
MCV: 91.4 fL (ref 80.0–100.0)
Monocytes Absolute: 0.3 10*3/uL (ref 0.1–1.0)
Monocytes Relative: 6 %
Neutro Abs: 4.1 10*3/uL (ref 1.7–7.7)
Neutrophils Relative %: 84 %
Platelet Count: 160 10*3/uL (ref 150–400)
RBC: 3.94 MIL/uL — ABNORMAL LOW (ref 4.22–5.81)
RDW: 13.5 % (ref 11.5–15.5)
WBC Count: 4.9 10*3/uL (ref 4.0–10.5)
nRBC: 0 % (ref 0.0–0.2)

## 2020-08-19 MED ORDER — PALONOSETRON HCL INJECTION 0.25 MG/5ML
INTRAVENOUS | Status: AC
  Filled 2020-08-19: qty 5

## 2020-08-19 MED ORDER — SODIUM CHLORIDE 0.9 % IV SOLN
30.0000 mg/m2 | Freq: Once | INTRAVENOUS | Status: AC
  Administered 2020-08-19: 50 mg via INTRAVENOUS
  Filled 2020-08-19: qty 50

## 2020-08-19 MED ORDER — PALONOSETRON HCL INJECTION 0.25 MG/5ML
0.2500 mg | Freq: Once | INTRAVENOUS | Status: AC
  Administered 2020-08-19: 0.25 mg via INTRAVENOUS

## 2020-08-19 MED ORDER — SODIUM CHLORIDE 0.9% FLUSH
10.0000 mL | Freq: Once | INTRAVENOUS | Status: AC | PRN
  Administered 2020-08-19: 10 mL
  Filled 2020-08-19: qty 10

## 2020-08-19 MED ORDER — POTASSIUM CHLORIDE 2 MEQ/ML IV SOLN
Freq: Once | INTRAVENOUS | Status: AC
  Filled 2020-08-19: qty 10

## 2020-08-19 MED ORDER — SODIUM CHLORIDE 0.9 % IV SOLN
150.0000 mg | Freq: Once | INTRAVENOUS | Status: AC
  Administered 2020-08-19: 150 mg via INTRAVENOUS
  Filled 2020-08-19: qty 150

## 2020-08-19 MED ORDER — SODIUM CHLORIDE 0.9% FLUSH
10.0000 mL | INTRAVENOUS | Status: DC | PRN
  Administered 2020-08-19: 10 mL
  Filled 2020-08-19: qty 10

## 2020-08-19 MED ORDER — SODIUM CHLORIDE 0.9 % IV SOLN
Freq: Once | INTRAVENOUS | Status: AC
  Filled 2020-08-19: qty 250

## 2020-08-19 MED ORDER — SODIUM CHLORIDE 0.9 % IV SOLN
10.0000 mg | Freq: Once | INTRAVENOUS | Status: AC
  Administered 2020-08-19: 10 mg via INTRAVENOUS
  Filled 2020-08-19: qty 10

## 2020-08-19 MED ORDER — HEPARIN SOD (PORK) LOCK FLUSH 100 UNIT/ML IV SOLN
500.0000 [IU] | Freq: Once | INTRAVENOUS | Status: AC | PRN
  Administered 2020-08-19: 500 [IU]
  Filled 2020-08-19: qty 5

## 2020-08-19 MED FILL — PROMETHAZINE 6.25 MG/5 ML S: 6.25 | 11 days supply | Qty: 473 | Fill #0

## 2020-08-19 MED FILL — DEXAMETHASONE 0.5 MG/5 ML L: 0.5 | 6 days supply | Qty: 500 | Fill #2

## 2020-08-19 NOTE — Patient Instructions (Signed)
Cancer Center Discharge Instructions for Patients Receiving Chemotherapy  Today you received the following chemotherapy agents Cisplatin  To help prevent nausea and vomiting after your treatment, we encourage you to take your nausea medication as directed  If you develop nausea and vomiting that is not controlled by your nausea medication, call the clinic.   BELOW ARE SYMPTOMS THAT SHOULD BE REPORTED IMMEDIATELY:  *FEVER GREATER THAN 100.5 F  *CHILLS WITH OR WITHOUT FEVER  NAUSEA AND VOMITING THAT IS NOT CONTROLLED WITH YOUR NAUSEA MEDICATION  *UNUSUAL SHORTNESS OF BREATH  *UNUSUAL BRUISING OR BLEEDING  TENDERNESS IN MOUTH AND THROAT WITH OR WITHOUT PRESENCE OF ULCERS  *URINARY PROBLEMS  *BOWEL PROBLEMS  UNUSUAL RASH Items with * indicate a potential emergency and should be followed up as soon as possible.  Feel free to call the clinic should you have any questions or concerns. The clinic phone number is [phone].  Please show the CHEMO ALERT CARD at check-in to the Emergency Department and triage nurse.   

## 2020-08-19 NOTE — Patient Instructions (Signed)

## 2020-08-19 NOTE — Progress Notes (Signed)
Nutrition follow-up completed with patient receiving treatment for nasopharyngeal cancer. Patient reports pain is slightly better.  He was able to speak briefly with me today. Reports he is giving between 5 and 6 cartons of Osmolite 1.5 daily via feeding tube. He is using 60 mL of water before and 120 mL of water after each bolus tube feeding. Reports he is unable to eat by mouth and occasionally will tolerate ice chips. Noted weight documented as 115.5 pounds on September 21 increased from 113.8 pounds September 14. Noted labs: Sodium 129, glucose 162, albumin 2.8.  Provider did not order phosphorus as was recommended.  Last magnesium was 7 days ago and was within normal limits.  Estimated nutrition needs: 1800-2000 cal, 62-82 g protein, 2.0 L fluid.  Nutrition diagnosis: Unintended weight loss improved.  Intervention: Continue Osmolite 1.5, 5 cartons daily with 60 mL of free water before and 120 mL of free water after each bolus feeding. This provides 1775 cal, 74.5 g protein and 1805 mL free water. Stressed importance of patient continuing mouth care. Encouraged him to continue ice chips by mouth.  Monitoring, evaluation, goals: Patient will continue to tolerate tube feeding to achieve weight improvement.  Next visit: Tuesday, September 28 during infusion.  **Disclaimer: This note was dictated with voice recognition software. Similar sounding words can inadvertently be transcribed and this note may contain transcription errors which may not have been corrected upon publication of note.**

## 2020-08-20 ENCOUNTER — Other Ambulatory Visit: Payer: Self-pay

## 2020-08-20 ENCOUNTER — Ambulatory Visit
Admission: RE | Admit: 2020-08-20 | Discharge: 2020-08-20 | Disposition: A | Discharge status: Home / Self Care | Payer: Medicare HMO | Source: Ambulatory Visit | Attending: Radiation Oncology | Admitting: Radiation Oncology

## 2020-08-20 DIAGNOSIS — C111 Malignant neoplasm of posterior wall of nasopharynx: Secondary | ICD-10-CM | POA: Diagnosis not present

## 2020-08-20 NOTE — Progress Notes (Signed)
[facility]   [phone]) [phone] [phone]) [phone]   Clinic Follow up Note   Patient Care Team: Patient, No Pcp Per as PCP - General ([place]) Cameron Sprang, MD as Consulting Physician (Neurology) Malmfelt, Stephani Police, RN as Oncology Nurse Navigator Alen Blew, Mathis Dad, MD as Consulting Physician (Oncology) Eppie Gibson, MD as Attending Physician (Radiation Oncology) Sharen Counter, CCC-SLP as Speech Language Pathologist (Speech Pathology) Karie Mainland, RD as Dietitian (Nutrition) Wynelle Beckmann, Melodie Bouillon, PT as Physical Therapist (Physical Therapy) 08/21/2020  CHIEF COMPLAINT: Follow-up nasopharyngeal cancer  CURRENT THERAPY: Radiation therapy with weekly cisplatin chemotherapy started on July 30, 2020.  INTERVAL HISTORY: [facility] returns for follow-up as scheduled.  He continues radiation.  He received IV fluids on 9/20 and week 4 cisplatin on 9/21 and is here today for follow-up before routine IV fluids. He has little more energy this week. His vomiting has improved.  He had been taking MiraLAX as recommended.  His pain is "all right" not sure if it has changed on treatment.  He takes roxicodone twice daily.  He continues tube feedings every 3-4 hours.  Denies fever, chills, cough, chest pain, dyspnea.   MEDICAL HISTORY:  Past Medical History:  Diagnosis Date  . Biceps tendon rupture    h/o Right biceps tendon rupture  . CAD (coronary artery disease)    a. ETT myoview (10/11) with EF 52%, reversible inferior perfusion defect, 1 mm inferolateral ST depression with exercise, 7"16" and stopped due to dyspnea and leg weakness -> LHC (10/11), EF 55%, 40% pRCA, 40% mRCA, 40% prox to mid CFX. medical treatment.  . Cancer ([place])    skin cancer behind left ear   . Dizziness    with bending over   . Hard of hearing    more per right ear  . Headache    with bright light   . History of bronchitis   . History of hiatal hernia   . Hyperlipidemia    . Impaired glucose tolerance   . Numbness and tingling in left arm    if holds arm upward too long pt states his primary MD is aware  . Osteoarthritis   . Peripheral vascular disease ([place]) 2005   a. s/p left fem-pop Dr.Lawson in 2005.  Marland Kitchen Shortness of breath dyspnea    walking distances or climbing stairs  . Tinnitus     SURGICAL HISTORY: Past Surgical History:  Procedure Laterality Date  . ds/p left femoral artery occlusion  2005   s/p left fem-pop bypass- Dr. Kellie Simmering  . HERNIA REPAIR    . IR GASTROSTOMY TUBE MOD SED  07/24/2020  . IR IMAGING GUIDED PORT INSERTION  07/24/2020  . MULTIPLE EXTRACTIONS WITH ALVEOLOPLASTY N/A 07/17/2020   Procedure: Extraction of tooth #'s 7,16,96,78,LFY 31 with alveoloplasty;  Surgeon: Lenn Cal, DDS;  Location: [facility];  Service: Oral Surgery;  Laterality: N/A;  . MYRINGOTOMY WITH TUBE PLACEMENT Right 06/26/2020   Procedure: MYRINGOTOMY WITH TUBE PLACEMENT RIGHT  EAR;  Surgeon: Melida Quitter, MD;  Location: [facility];  Service: ENT;  Laterality: Right;  . NASAL ENDOSCOPY N/A 06/26/2020   Procedure: NASAL ENDOSCOPY W/NASOPHARYNGEAL BIOPSY;  Surgeon: Melida Quitter, MD;  Location: [facility];  Service: ENT;  Laterality: N/A;  . PR VEIN BYPASS GRAFT,AORTO-FEM-POP    . s/p right bicep tendon rupture repair    . VENTRAL HERNIA REPAIR N/A 08/22/2015   Procedure: OPEN VENTRAL HERNIA REPAIR WITH MESH;  Surgeon:  Jackolyn Confer, MD;  Location: [facility];  Service: General;  Laterality: N/A;    I have reviewed the social history and family history with the patient and they are unchanged from previous note.  ALLERGIES:  has No Known Allergies.  MEDICATIONS:  Current Outpatient Medications  Medication Sig Dispense Refill  . atorvastatin (LIPITOR) 20 MG tablet Take 20 mg by mouth at bedtime.  (Patient not taking: Reported on 08/05/2020)    . dexamethasone (DECADRON) 0.5 MG/5ML solution Take 58mL BID by PEG (with food). On 9/14,  taper to 59mL daily with food. On 9/21, taper to 39mL daily with food.  On 9/28, taper to 54mL daily with food. Stop on 10/9. 500 mL 2  . fentaNYL (DURAGESIC) 50 MCG/HR Place 1 patch onto the skin every 3 (three) days. 10 patch 0  . gabapentin (NEURONTIN) 300 MG capsule Take 2 capsules in AM, 3 capsules in evening (Patient not taking: Reported on 08/05/2020) 450 capsule 3  . lansoprazole (PREVACID SOLUTAB) 15 MG disintegrating tablet Dissolve one tablet in mouth daily.  (Affordable alternative to previous liquid Rx for Omeprazole). 30 tablet 1  . lidocaine (XYLOCAINE) 2 % solution Patient: Mix 1part 2% viscous lidocaine, 1part H20. Swish & swallow 59mL of diluted mixture, 62min before meals and at bedtime, up to QID 200 mL 4  . lidocaine-prilocaine (EMLA) cream Apply 1 application topically as needed. (Patient not taking: Reported on 08/05/2020) 30 g 0  . metoCLOPramide (REGLAN) 5 MG/5ML solution Place 5 mLs (5 mg total) into feeding tube 3 (three) times daily before meals. 240 mL 2  . nitroGLYCERIN (NITROSTAT) 0.4 MG SL tablet Place 1 tablet (0.4 mg total) under the tongue every 5 (five) minutes as needed. Place 1 tablet under tongue for chest pain -- not to exceed 3 in 15 minutes (Patient not taking: Reported on 07/31/2020) 100 tablet 11  . nortriptyline (PAMELOR) 10 MG capsule Take 1 capsule every night for 1 week, then increase to 2 capsules every night (Patient not taking: Reported on 08/05/2020) 60 capsule 11  . ondansetron (ZOFRAN ODT) 4 MG disintegrating tablet Take 1 tablet (4 mg total) by mouth every 8 (eight) hours as needed for nausea or vomiting. (Patient not taking: Reported on 08/05/2020) 20 tablet 0  . ondansetron (ZOFRAN) 4 MG/5ML solution Take 7.5 mLs (6 mg total) by mouth every 8 (eight) hours as needed for nausea or vomiting. Okay to take by tube, if preferred. Take around the clock, 3 x's daily, if nausea is severe. 473 mL 5  . oxyCODONE (ROXICODONE) 5 MG/5ML solution Place 5 mLs (5 mg total)  into feeding tube every 4 (four) hours as needed for severe pain. 240 mL 0  . prochlorperazine (COMPAZINE) 10 MG tablet Take 1 tablet (10 mg total) by mouth every 6 (six) hours as needed for nausea or vomiting. (Patient not taking: Reported on 07/31/2020) 30 tablet 0  . promethazine (PHENERGAN) 6.25 MG/5ML syrup 6.25 to 12.5 mg Q 6 hours VT prn nausea 473 mL 2  . rOPINIRole (REQUIP) 0.25 MG tablet TAKE 1 TABLET BY MOUTH 2 HOURS BEFORE BEDTIME (Patient not taking: Reported on 08/05/2020) 90 tablet 3  . tiZANidine (ZANAFLEX) 4 MG tablet Take 1 tablet (4 mg total) by mouth at bedtime. (Patient not taking: Reported on 08/05/2020) 30 tablet 11  . traMADol (ULTRAM) 50 MG tablet TAKE 1 TABLET BY MOUTH EVERY NIGHT (Patient not taking: Reported on 08/05/2020) 90 tablet 3   Current Facility-Administered Medications  Medication Dose Route Frequency  Provider Last Rate Last Admin  . loperamide (IMODIUM A-D) tablet 4 mg  4 mg Oral Once Alla Feeling, NP       Facility-Administered Medications Ordered in Other Visits  Medication Dose Route Frequency Provider Last Rate Last Admin  . heparin lock flush 100 unit/mL  250 Units Intracatheter Once PRN Wyatt Portela, MD      . heparin lock flush 100 unit/mL  500 Units Intracatheter Once PRN Wyatt Portela, MD      . sodium chloride flush (NS) 0.9 % injection 10 mL  10 mL Intracatheter Once PRN Wyatt Portela, MD      . sodium chloride flush (NS) 0.9 % injection 10 mL  10 mL Intracatheter PRN Wyatt Portela, MD   10 mL at 07/30/20 1520  . sodium chloride flush (NS) 0.9 % injection 10 mL  10 mL Intracatheter Once PRN Wyatt Portela, MD        PHYSICAL EXAMINATION:  Vitals:   08/21/20 1109  BP: 131/80  Pulse: 67  Resp: 18  Temp: 98.6 F (37 C)  SpO2: 98%   Filed Weights   08/21/20 1109  Weight: 116 lb 9.6 oz (52.9 kg)    GENERAL:alert, no distress and comfortable SKIN: Dry skin with poor turgor.  EYES: sclera clear OROPHARYNX: No thrush or  ulcers NECK:Dry desquamation in the radiation treatment field LUNGS: clear with normal breathing effort HEART: regular rate & rhythm, no lower extremity edema ABDOMEN: Feeding tube in place, dressing C/D/I NEURO: alert & oriented x 3 with fluent speech PAC without erythema  Exam performed in wheelchair   LABORATORY DATA:  I have reviewed the data as listed CBC Latest Ref Rng & Units 08/19/2020 08/12/2020 08/05/2020  WBC 4.0 - 10.5 K/uL 4.9 9.1 11.9(H)  Hemoglobin 13.0 - 17.0 g/dL 12.4(L) 13.4 14.3  Hematocrit 39 - 52 % 36.0(L) 39.2 41.8  Platelets 150 - 400 K/uL 160 211 338     CMP Latest Ref Rng & Units 08/19/2020 08/12/2020 08/05/2020  Glucose 70 - 99 mg/dL 162(H) 109(H) 113(H)  BUN 8 - 23 mg/dL 15 36(H) 18  Creatinine 0.61 - 1.24 mg/dL 0.80 0.83 0.74  Sodium 135 - 145 mmol/L 129(L) 124(L) 130(L)  Potassium 3.5 - 5.1 mmol/L 4.0 3.5 4.3  Chloride 98 - 111 mmol/L 92(L) 88(L) 93(L)  CO2 22 - 32 mmol/L 28 28 28   Calcium 8.9 - 10.3 mg/dL 8.2(L) 8.4(L) 9.0  Total Protein 6.5 - 8.1 g/dL 5.9(L) 6.5 7.0  Total Bilirubin 0.3 - 1.2 mg/dL 0.5 0.6 0.7  Alkaline Phos 38 - 126 U/L 87 95 102  AST 15 - 41 U/L 24 20 19   ALT 0 - 44 U/L 56(H) 40 21      RADIOGRAPHIC STUDIES: I have personally reviewed the radiological images as listed and agreed with the findings in the report. No results found.   ASSESSMENT & PLAN:   1. 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  -undergoing ccRT with weekly cisplatin starting 08/05/20   2. Malnutrition, weight loss -followed by Dietician -feeding tube q3-4 hours with water   3. Vomiting and diarrhea -vomiting improved this week but he developed diarrhea on miralax. Reviewed supportive care -Reglan started 9/27, continue zofran and compazine  -start imodium for diarrhea (08/21/20)  4. Pain -on Fentanyl patch currently at 50 mcg with  breakthrough liquid oxycodone which he  takes BID -pain controlled   5. Hyponatremia -Multifactorial in nature could be dehydration related versus SIADH related to cisplatin therapy -MD recommendation is to continue cisplatin with aggressive normal saline hydration to improve -Na improved to 129 this week   Disposition: [facility] appears stable.  He continues ccRT with weekly cisplatin, s/p week 4 and frequent supportive care IV hydration.  His vomiting has improved, however he developed diarrhea while taking MiraLAX.  I recommend to hold miralax and begin Imodium.  He will receive 4 mg in clinic while here for routine IV fluids today.  VSS. Na improved.   I think he would benefit from increasing IV hydration to 3 times next week and possibly for the duration of chemoRT. Will arrange next week.  I have discussed his case with the oncology team.  Follow-up 9/30, or sooner if needed.  All questions were answered. The patient knows to call the clinic with any problems, questions or concerns. No barriers to learning were detected.     Alla Feeling, NP 08/21/20

## 2020-08-20 NOTE — Progress Notes (Signed)
[facility] Progress Note   AAIDYN SAN [id number] 12/07/1950 69 y.o.  Torrion SIMONE TUCKEY is managed by Dr. 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  He continues to receive daily radiation. He is scheduled to be seen next on 08/21/2020.  Nausea: Patient was given Zofran 8 mg IV today and was given 1 liter of normal saline IV.  Please see After Visit Summary for patient specific instructions.  Future Appointments  Date Time Provider [place]  08/20/2020 10:15 AM [facility] LINAC 4 [facility] None  08/21/2020 10:15 AM [facility] LINAC 4 [facility] None  08/21/2020 10:45 AM Alla Feeling, NP [facility] None  08/21/2020 12:00 PM [facility] INFUSION [facility] None  08/22/2020 10:15 AM [facility] LINAC 4 [facility] None  08/25/2020 10:15 AM [facility] LINAC 4 [facility] None  08/26/2020  7:45 AM [facility] LAB [facility] None  08/26/2020  8:00 AM [facility] [name] FLUSH [facility] None  08/26/2020  8:45 AM [facility] [id number] [facility] None  08/26/2020  9:00 AM [facility] INFUSION [facility] None  08/26/2020  9:45 AM Zenia Resides, Joli, RD [facility] None  08/27/2020 10:15 AM [facility] LINAC 4 [facility] None  08/28/2020 10:15 AM [facility] LINAC 4 [facility] None  08/28/2020  2:30 PM Riannah Stagner E., PA-C [facility] None  08/28/2020  3:00 PM Edwyna Shell C, LCSW [facility] None  08/28/2020  3:30 PM [facility] INFUSION [facility] None  08/29/2020 10:15 AM [facility] LINAC 4 [facility] None    09/01/2020  8:00 AM [facility] LAB [facility] None  09/01/2020  8:15 AM [facility] [place] FLUSH [facility] None  09/01/2020  8:45 AM Shadad, Mathis Dad, MD [facility] None  09/01/2020  9:30 AM [facility] LINAC 4 [facility] None  09/02/2020  8:15 AM [facility] [id number] [facility] None  09/02/2020  8:30 AM [facility] INFUSION [facility] None  09/02/2020  9:00 AM Neff, Barbara L, RD [facility] None  09/03/2020 10:15 AM [facility] LINAC 4 [facility] None  09/04/2020  8:15 AM [facility] INFUSION [facility] None  09/04/2020 10:15 AM [facility] LINAC 4 [facility] None  09/05/2020 10:15 AM [facility] LINAC 4 [facility] None  09/08/2020 10:15 AM [facility] LINAC 4 [facility] None  09/09/2020  7:45 AM [facility] LAB [facility] None  09/09/2020  8:00 AM [facility] [place] FLUSH [facility] None  09/09/2020  8:15 AM [facility] [id number] [facility] None  09/09/2020  8:30 AM Shadad, Mathis Dad, MD [facility] None  09/09/2020  9:00 AM [facility] INFUSION [facility] None  09/09/2020  9:45 AM Jennet Maduro, RD [facility] None  09/10/2020 10:15 AM [facility] LINAC 4 [facility] None  09/11/2020 10:15 AM [facility] LINAC 4 [facility] None  09/12/2020 10:15 AM [facility] LINAC 4 [facility] None  09/15/2020 10:15 AM [facility] LINAC 4 [facility] None  09/16/2020 10:15 AM [facility] LINAC 4 [facility] None  09/17/2020 10:15 AM [facility] LINAC 4 [facility] None    No orders of the defined types were placed in this encounter.      Subjective:   Patient ID:  MASARU CHAMBERLIN is a 69 y.o. (DOB 08-May-1951) male.  Chief Complaint: No chief complaint on file.   HPI Leander BREWSTER WOLTERS  is a 69 y.o. 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  He is followed by Dr. Alen Blew and is status post cycle 3, day 1 of cisplatin which he received on 08/12/2020.  He continues to receive daily concurrent radiation therapy.  He presents to the clinic today with nausea. He is  receiving Zofran 8 mg IV x 1 and 1 liter of normal saline. He continues to have neck pain.   Medications: I have reviewed the patient's current medications.  Allergies: No Known Allergies  Past Medical History:  Diagnosis Date  . Biceps tendon rupture    h/o Right biceps tendon rupture  . CAD (coronary artery disease)    a. ETT myoview (10/11) with EF 52%, reversible inferior perfusion defect, 1 mm inferolateral ST depression with exercise, 7"16" and stopped due to dyspnea and leg weakness -> LHC (10/11), EF 55%, 40% pRCA, 40% mRCA, 40% prox to mid CFX. medical treatment.  . Cancer ([place])    skin cancer behind left ear   . Dizziness    with bending over   . Hard of hearing    more per right ear  . Headache    with bright light   . History of bronchitis   . History of hiatal hernia   . Hyperlipidemia   . Impaired glucose tolerance   . Numbness and tingling in left arm    if holds arm upward too long pt states his primary MD is aware  . Osteoarthritis   . Peripheral vascular disease ([place]) 2005   a. s/p left fem-pop Dr.Lawson in 2005.  Marland Kitchen Shortness of breath dyspnea    walking distances or climbing stairs  . Tinnitus     Past Surgical History:  Procedure Laterality Date  . ds/p left femoral artery occlusion  2005   s/p left fem-pop bypass- Dr. Kellie Simmering  . HERNIA REPAIR    . IR GASTROSTOMY TUBE MOD SED  07/24/2020  . IR IMAGING GUIDED PORT INSERTION  07/24/2020  . MULTIPLE EXTRACTIONS WITH ALVEOLOPLASTY N/A 07/17/2020   Procedure: Extraction of tooth #'s 6,96,78,93,YBO 31 with alveoloplasty;  Surgeon: Lenn Cal, DDS;  Location: [facility];  Service: Oral Surgery;  Laterality: N/A;  . MYRINGOTOMY WITH TUBE PLACEMENT Right 06/26/2020   Procedure: MYRINGOTOMY WITH TUBE PLACEMENT RIGHT  EAR;  Surgeon: Melida Quitter, MD;  Location: [facility];  Service: ENT;  Laterality: Right;  . NASAL ENDOSCOPY N/A 06/26/2020   Procedure: NASAL ENDOSCOPY W/NASOPHARYNGEAL BIOPSY;   Surgeon: Melida Quitter, MD;  Location: [facility];  Service: ENT;  Laterality: N/A;  . PR VEIN BYPASS GRAFT,AORTO-FEM-POP    . s/p right bicep tendon rupture repair    . VENTRAL HERNIA REPAIR N/A 08/22/2015   Procedure: OPEN VENTRAL HERNIA REPAIR WITH MESH;  Surgeon: Jackolyn Confer, MD;  Location: [facility];  Service: General;  Laterality: N/A;    Family History  Problem Relation Age of Onset  . Cervical cancer Mother   . Cancer Mother        cervical  . Lung cancer Father   . Cancer Father        lung    Social History   Socioeconomic History  . Marital status: Married    Spouse name: Not on file  . Number of children: 1  . Years of education: Not on file  . Highest education level: Not on file  Occupational History  . Occupation: Social research officer, government  Tobacco Use  . Smoking status: Former Smoker  Packs/day: 7.00    Years: 40.00    Pack years: 280.00    Types: Cigarettes    Quit date: 11/29/1994    Years since quitting: 25.7  . Smokeless tobacco: Never Used  Vaping Use  . Vaping Use: Never used  Substance and Sexual Activity  . Alcohol use: No  . Drug use: No  . Sexual activity: Not Currently  Other Topics Concern  . Not on file  Social History Narrative   Right handed      Highest level of edu- 11th grade      Lives in one story home      Social Determinants of Health   Financial Resource Strain: Medium Risk  . Difficulty of Paying Living Expenses: Somewhat hard  Food Insecurity: No Food Insecurity  . Worried About Charity fundraiser in the Last Year: Never true  . Ran Out of Food in the Last Year: Never true  Transportation Needs: No Transportation Needs  . Lack of Transportation (Medical): No  . Lack of Transportation (Non-Medical): No  Physical Activity:   . Days of Exercise per Week: Not on file  . Minutes of Exercise per Session: Not on file  Stress:   . Feeling of Stress : Not on file  Social Connections:   . Frequency of  Communication with Friends and Family: Not on file  . Frequency of Social Gatherings with Friends and Family: Not on file  . Attends Religious Services: Not on file  . Active Member of Clubs or Organizations: Not on file  . Attends Archivist Meetings: Not on file  . Marital Status: Not on file  Intimate Partner Violence:   . Fear of Current or Ex-Partner: Not on file  . Emotionally Abused: Not on file  . Physically Abused: Not on file  . Sexually Abused: Not on file    Past Medical History, Surgical history, Social history, and Family history were reviewed and updated as appropriate.   Please see review of systems for further details on the patient's review from today.   Review of Systems:  Review of Systems  Constitutional: Positive for appetite change. Negative for chills, diaphoresis and fever.  HENT: Negative for sore throat and trouble swallowing.   Respiratory: Negative for cough, choking, shortness of breath and wheezing.   Cardiovascular: Negative for chest pain and palpitations.  Gastrointestinal: Positive for nausea and vomiting. Negative for constipation and diarrhea.  Genitourinary: Negative for decreased urine volume.  Neurological: Negative for headaches.       Pain at the base of the skull and neck.    Objective:   Physical Exam:  BP (!) 160/76   Pulse 64   Temp 98 F (36.7 C) (Oral)   Resp 20  ECOG: 2  Physical Exam Constitutional:      General: He is not in acute distress.    Appearance: He is not diaphoretic.     Comments: The patient is laying on his right side in a recliner.  HENT:     Head: Normocephalic and atraumatic.  Cardiovascular:     Rate and Rhythm: Normal rate and regular rhythm.     Heart sounds: Normal heart sounds. No murmur heard.  No friction rub. No gallop.   Pulmonary:     Effort: Pulmonary effort is normal. No respiratory distress.     Breath sounds: Normal breath sounds. No wheezing or rales.  Skin:    General:  Skin is warm and dry.  Findings: No erythema or rash.     Lab Review:     Component Value Date/Time   NA 129 (L) 08/19/2020 0813   K 4.0 08/19/2020 0813   CL 92 (L) 08/19/2020 0813   CO2 28 08/19/2020 0813   GLUCOSE 162 (H) 08/19/2020 0813   BUN 15 08/19/2020 0813   CREATININE 0.80 08/19/2020 0813   CREATININE 1.00 06/27/2015 1222   CALCIUM 8.2 (L) 08/19/2020 0813   PROT 5.9 (L) 08/19/2020 0813   ALBUMIN 2.8 (L) 08/19/2020 0813   AST 24 08/19/2020 0813   ALT 56 (H) 08/19/2020 0813   ALKPHOS 87 08/19/2020 0813   BILITOT 0.5 08/19/2020 0813   GFRNONAA >60 08/19/2020 0813   GFRAA >60 08/19/2020 0813       Component Value Date/Time   WBC 4.9 08/19/2020 0813   WBC 11.8 (H) 07/29/2020 1137   RBC 3.94 (L) 08/19/2020 0813   HGB 12.4 (L) 08/19/2020 0813   HCT 36.0 (L) 08/19/2020 0813   PLT 160 08/19/2020 0813   MCV 91.4 08/19/2020 0813   MCH 31.5 08/19/2020 0813   MCHC 34.4 08/19/2020 0813   RDW 13.5 08/19/2020 0813   LYMPHSABS 0.5 (L) 08/19/2020 0813   MONOABS 0.3 08/19/2020 0813   EOSABS 0.0 08/19/2020 0813   BASOSABS 0.0 08/19/2020 0813   -------------------------------  Imaging from last 24 hours (if applicable):  Radiology interpretation: DG Cervical Spine Complete  Result Date: 07/24/2020 CLINICAL DATA:  Severe posterior neck pain. EXAM: CERVICAL SPINE - COMPLETE 4+ VIEW COMPARISON:  CT 06/04/2020.  MRI cervical spine 08/17/2016. FINDINGS: Head is angulated to the right. PowerPort catheter noted. Soft tissues are unremarkable. Diffuse osteopenia and degenerative change. 3 mm anterolisthesis C4 on C5. Multifocal bilateral neural foraminal narrowing. No evidence of fracture or dislocation. Pulmonary apices are clear. Carotid vascular calcification again noted. IMPRESSION: 1. Diffuse osteopenia and degenerative change. 3 mm anterolisthesis C4 on C5. Multifocal bilateral neural foraminal narrowing. No acute bony abnormality identified. Reference is made to prior CT  report of 06/04/2020. 2.  Carotid vascular disease. Electronically Signed   By: Marcello Moores  Register   On: 07/24/2020 13:50   CT Cervical Spine Wo Contrast  Result Date: 07/29/2020 CLINICAL DATA:  Neck trauma. Additional history provided: Patient reports posterior neck and right lateral neck pain for 5 days since since injuring neck while mowing yard. EXAM: CT CERVICAL SPINE WITHOUT CONTRAST TECHNIQUE: Multidetector CT imaging of the cervical spine was performed without intravenous contrast. Multiplanar CT image reconstructions were also generated. COMPARISON:  PET-CT 07/07/2020, neck CT 06/04/2020. FINDINGS: Alignment: No significant spondylolisthesis. Skull base and vertebrae: The basion-dental and atlanto-dental intervals are maintained.No evidence of acute fracture to the cervical spine. Soft tissues and spinal canal: No prevertebral fluid or swelling. No visible canal hematoma. Disc levels: Cervical spondylosis with multilevel disc space narrowing, disc bulges and uncovertebral hypertrophy. No high-grade bony spinal canal stenosis. Upper chest: Emphysema and biapical pleuroparenchymal scarring. Partially visualized right chest infusion port catheter. Other: New from the prior neck CT of 06/04/2020, there is a mildly displaced acute fracture of the right occipital calvarium which may extend to the foramen magnum (for instance as seen on series 7, images 18-23) (series 9, image 19). This fracture may also extend to involve the right jugular foramen. A known necrotic mass centered within the posterior right nasopharynx and upper right neck is inadequately reassessed on this noncontrast examination. However, there has been progressive destructive osseous erosion of the right occipital condyle, petrous apex and right aspect  of the clivus. These results were called by telephone at the time of interpretation on 07/29/2020 at 3:08 pm to provider JOSHUA LONG , who verbally acknowledged these results. IMPRESSION: Mildly  displaced acute fracture of the right occipital calvarium. This fracture may extend to the foramen magnum and may also extend to involve the right jugular foramen. A known necrotic mass centered within the posterior right nasopharynx and upper right neck is inadequately reassessed on this noncontrast examination. However, there has been progressive destructive osseous erosion of the right occipital condyle, right petrous apex and right aspect of the clivus as compared to the neck CT of 06/04/2020. Consider contrast-enhanced brain MRI to assess for injury to the right sigmoid sinus/jugular vein and to assess for involvement of the these vascular structures by the right nasopharyngeal mass. No evidence of acute fracture to the cervical spine. Cervical spondylosis as described. Electronically Signed   By: Kellie Simmering DO   On: 07/29/2020 15:09   MR Brain W and Wo Contrast  Result Date: 07/29/2020 CLINICAL DATA:  Head trauma EXAM: MRI HEAD WITHOUT AND WITH CONTRAST TECHNIQUE: Multiplanar, multiecho pulse sequences of the brain and surrounding structures were obtained without and with intravenous contrast. CONTRAST:  29mL GADAVIST GADOBUTROL 1 MMOL/ML IV SOLN COMPARISON:  06/04/2020 CT neck. FINDINGS: Brain: Diffusion-weighted hyperintensity involving the anterior pons and midbrain without focal ADC correlate is likely artifactual. No acute infarct. No intracranial hemorrhage. No midline shift, ventriculomegaly or extra-axial fluid collection. Mild chronic microvascular ischemic changes. Sequela of remote insults involving the left parietal lobe and vermis. No abnormal enhancement involving the brain parenchyma. Vascular: Major intracranial flow voids are preserved. Skull and upper cervical spine: Right occipital condyle erosions and the cervical spine are better evaluated on recent CT neck. No focal calvarial lesions. Sinuses/Orbits: No acute orbital finding. Pneumatized paranasal sinuses. Right mastoid effusion.  Other: Partially imaged necrotic right neck mass along the nasopharynx, underlying the occipital condyle is fully characterized on recent CT neck. Neoplastic involvement of the right skull base and erosive changes involving the right occipital bone are also better evaluated on recent CT. IMPRESSION: No acute intracranial pathology. Partially imaged tumor along the right nasopharynx with erosive changes involving the right skull base, better characterized on recent CT neck. No abnormal enhancement involving the brain parenchyma. Electronically Signed   By: Primitivo Gauze M.D.   On: 07/29/2020 17:38   CT ABDOMEN PELVIS W CONTRAST  Result Date: 07/29/2020 CLINICAL DATA:  69 year old male with history of abdominal pain. Feeding tube placed on 07/24/2020. EXAM: CT ABDOMEN AND PELVIS WITH CONTRAST TECHNIQUE: Multidetector CT imaging of the abdomen and pelvis was performed using the standard protocol following bolus administration of intravenous contrast. CONTRAST:  172mL OMNIPAQUE IOHEXOL 300 MG/ML  SOLN COMPARISON:  No priors. FINDINGS: Lower chest: Paraseptal emphysema noted in the lower lungs bilaterally. Atherosclerotic calcifications in the descending thoracic aorta and right coronary artery. Hepatobiliary: Subcentimeter low-attenuation lesion in segment 4A of the liver, too small to characterize, but statistically likely to represent a tiny cyst. No other larger more suspicious hepatic lesions. No intra or extrahepatic biliary ductal dilatation. Gallbladder is normal in appearance. Pancreas: No pancreatic mass. No pancreatic ductal dilatation. No pancreatic or peripancreatic fluid collections or inflammatory changes. Spleen: Unremarkable. Adrenals/Urinary Tract: Multiple low-attenuation lesions in both kidneys compatible with simple cysts, largest of which measures 2 cm in the lower pole of the right kidney. Other subcentimeter low-attenuation lesions in both kidneys, too small to definitively characterize,  but statistically likely to  represent tiny cysts. Bilateral adrenal glands are normal in appearance. No hydroureteronephrosis. Urinary bladder is normal in appearance. Stomach/Bowel: Percutaneous gastrostomy tube with retention tip in the distal body of the stomach. No pathologic dilatation of small bowel or colon. Normal appendix. Vascular/Lymphatic: Aortic atherosclerosis, without evidence of aneurysm or dissection in the abdominal or pelvic vasculature. No lymphadenopathy noted in the abdomen or pelvis. Reproductive: Prostate gland and seminal vesicles are unremarkable in appearance. Other: No significant volume of ascites. Trace volume of pneumoperitoneum most evident adjacent to the liver. Musculoskeletal: There are no aggressive appearing lytic or blastic lesions noted in the visualized portions of the skeleton. IMPRESSION: 1. Trace volume of pneumoperitoneum, within normal limits given the recent placement of the percutaneous gastrostomy tube. No other acute findings are noted in the abdomen or pelvis. 2. Paraseptal emphysema. 3. Aortic atherosclerosis, in addition to at least right coronary artery disease. Assessment for potential risk factor modification, dietary therapy or pharmacologic therapy may be warranted, if clinically indicated. 4. Additional incidental findings, as above. Electronically Signed   By: Vinnie Langton M.D.   On: 07/29/2020 14:56   IR GASTROSTOMY TUBE MOD SED  Result Date: 07/24/2020 INDICATION: 69 year old male referred for gastrostomy placement. EXAM: PERC PLACEMENT GASTROSTOMY MEDICATIONS: 2 g Ancef; Antibiotics were administered within 1 hour of the procedure. ANESTHESIA/SEDATION: Versed 0.5 mg IV; Fentanyl 25 mcg IV Moderate Sedation Time:  10 minutes The patient was continuously monitored during the procedure by the interventional radiology nurse under my direct supervision. CONTRAST:  45mL OMNIPAQUE IOHEXOL 300 MG/ML SOLN - administered into the gastric lumen. FLUOROSCOPY  TIME:  Fluoroscopy Time: 0 minutes 48 seconds (6 mGy). COMPLICATIONS: None PROCEDURE: Informed written consent was obtained from the patient and the patient's family after a thorough discussion of the procedural risks, benefits and alternatives. All questions were addressed. Maximal Sterile Barrier Technique was utilized including caps, mask, sterile gowns, sterile gloves, sterile drape, hand hygiene and skin antiseptic. A timeout was performed prior to the initiation of the procedure. The epigastrium was prepped with Betadine in a sterile fashion, and a sterile drape was applied covering the operative field. A sterile gown and sterile gloves were used for the procedure. A 5-French orogastric tube is placed under fluoroscopic guidance. Scout imaging of the abdomen confirms barium within the transverse colon. The stomach was distended with gas. Under fluoroscopic guidance, an 18 gauge needle was utilized to puncture the anterior wall of the body of the stomach. An Amplatz wire was advanced through the needle passing a T fastener into the lumen of the stomach. The T fastener was secured for gastropexy. A 9-French sheath was inserted. A snare was advanced through the 9-French sheath. A Britta Mccreedy was advanced through the orogastric tube. It was snared then pulled out the oral cavity, pulling the snare, as well. The leading edge of the gastrostomy was attached to the snare. It was then pulled down the esophagus and out the percutaneous site. Tube secured in place. Contrast was injected. Patient tolerated the procedure well and remained hemodynamically stable throughout. No complications were encountered and no significant blood loss encountered. IMPRESSION: Status post fluoroscopic placed percutaneous gastrostomy tube, with 20 [place] pull-through. Signed, Dulcy Fanny. Earleen Newport, DO [facility] Radiology Electronically Signed   By: Corrie Mckusick D.O.   On: 07/24/2020 13:07   IR  IMAGING GUIDED PORT INSERTION  Result Date: 07/24/2020 INDICATION: 69 year old male with a history head and neck carcinoma referred for port catheter placement EXAM: IMAGE GUIDED PORT  PLACEMENT MEDICATIONS: 2 g Ancef; The antibiotic was administered within an appropriate time interval prior to skin puncture. ANESTHESIA/SEDATION: Moderate (conscious) sedation was employed during this procedure. A total of Versed 1.0 mg and Fentanyl 50 mcg was administered intravenously. Moderate Sedation Time: 18 minutes. The patient's level of consciousness and vital signs were monitored continuously by radiology nursing throughout the procedure under my direct supervision. FLUOROSCOPY TIME:  Fluoroscopy Time: 0 minutes 6 seconds (1 mGy). COMPLICATIONS: None PROCEDURE: The procedure, risks, benefits, and alternatives were explained to the patient. Questions regarding the procedure were encouraged and answered. The patient understands and consents to the procedure. Ultrasound survey was performed with images stored and sent to PACs. The right neck and chest was prepped with chlorhexidine, and draped in the usual sterile fashion using maximum barrier technique (cap and mask, sterile gown, sterile gloves, large sterile sheet, hand hygiene and cutaneous antiseptic). Antibiotic prophylaxis was provided with 2.0g Ancef administered IV one hour prior to skin incision. Local anesthesia was attained by infiltration with 1% lidocaine without epinephrine. Ultrasound demonstrated patency of the right internal jugular vein, and this was documented with an image. Under real-time ultrasound guidance, this vein was accessed with a 21 gauge micropuncture needle and image documentation was performed. A small dermatotomy was made at the access site with an 11 scalpel. A 0.018" wire was advanced into the SVC and used to estimate the length of the internal catheter. The access needle exchanged for a 31F micropuncture vascular sheath. The 0.018" wire  was then removed and a 0.035" wire advanced into the IVC. An appropriate location for the subcutaneous reservoir was selected below the clavicle and an incision was made through the skin and underlying soft tissues. The subcutaneous tissues were then dissected using a combination of blunt and sharp surgical technique and a pocket was formed. A single lumen power injectable portacatheter was then tunneled through the subcutaneous tissues from the pocket to the dermatotomy and the port reservoir placed within the subcutaneous pocket. The venous access site was then serially dilated and a peel away vascular sheath placed over the wire. The wire was removed and the port catheter advanced into position under fluoroscopic guidance. The catheter tip is positioned in the cavoatrial junction. This was documented with a spot image. The portacatheter was then tested and found to flush and aspirate well. The port was flushed with saline followed by 100 units/mL heparinized saline. The pocket was then closed in two layers using first subdermal inverted interrupted absorbable sutures followed by a running subcuticular suture. The epidermis was then sealed with Dermabond. The dermatotomy at the venous access site was also seal with Dermabond. Patient tolerated the procedure well and remained hemodynamically stable throughout. No complications encountered and no significant blood loss encountered IMPRESSION: Status post right IJ port catheter placement. Signed, Dulcy Fanny. Dellia Nims, RPVI [facility] Radiology Electronically Signed   By: Corrie Mckusick D.O.   On: 07/24/2020 13:07        This case was discussed Dr. Alen Blew. He expressed agreement with my management of this patient.

## 2020-08-21 ENCOUNTER — Ambulatory Visit: Payer: Medicare HMO

## 2020-08-21 ENCOUNTER — Inpatient Hospital Stay: Payer: Medicare HMO

## 2020-08-21 ENCOUNTER — Encounter: Payer: Self-pay | Admitting: Nurse Practitioner

## 2020-08-21 ENCOUNTER — Inpatient Hospital Stay (HOSPITAL_BASED_OUTPATIENT_CLINIC_OR_DEPARTMENT_OTHER): Payer: Medicare HMO | Admitting: Nurse Practitioner

## 2020-08-21 ENCOUNTER — Ambulatory Visit
Admission: RE | Admit: 2020-08-21 | Discharge: 2020-08-21 | Disposition: A | Discharge status: Home / Self Care | Payer: Medicare HMO | Source: Ambulatory Visit | Attending: Radiation Oncology | Admitting: Radiation Oncology

## 2020-08-21 ENCOUNTER — Ambulatory Visit: Payer: Medicare HMO | Admitting: Medical

## 2020-08-21 ENCOUNTER — Other Ambulatory Visit: Payer: Self-pay

## 2020-08-21 VITALS — BP 139/82 | HR 72

## 2020-08-21 VITALS — BP 131/80 | HR 67 | Temp 98.6°F | Resp 18 | Ht 65.0 in | Wt 116.6 lb

## 2020-08-21 DIAGNOSIS — Z5111 Encounter for antineoplastic chemotherapy: Secondary | ICD-10-CM | POA: Diagnosis not present

## 2020-08-21 DIAGNOSIS — C111 Malignant neoplasm of posterior wall of nasopharynx: Secondary | ICD-10-CM | POA: Diagnosis not present

## 2020-08-21 DIAGNOSIS — C119 Malignant neoplasm of nasopharynx, unspecified: Secondary | ICD-10-CM

## 2020-08-21 MED ORDER — LOPERAMIDE HCL 2 MG PO CAPS
ORAL_CAPSULE | ORAL | Status: AC
  Filled 2020-08-21: qty 2

## 2020-08-21 MED ORDER — LOPERAMIDE HCL 2 MG PO TABS
4.0000 mg | ORAL_TABLET | Freq: Once | ORAL | Status: DC
  Filled 2020-08-21: qty 2

## 2020-08-21 MED ORDER — SODIUM CHLORIDE 0.9% FLUSH
10.0000 mL | Freq: Once | INTRAVENOUS | Status: AC | PRN
  Administered 2020-08-21: 10 mL
  Filled 2020-08-21: qty 10

## 2020-08-21 MED ORDER — HEPARIN SOD (PORK) LOCK FLUSH 100 UNIT/ML IV SOLN
500.0000 [IU] | Freq: Once | INTRAVENOUS | Status: AC | PRN
  Administered 2020-08-21: 500 [IU]
  Filled 2020-08-21: qty 5

## 2020-08-21 MED ORDER — SODIUM CHLORIDE 0.9 % IV SOLN
Freq: Once | INTRAVENOUS | Status: AC
  Filled 2020-08-21: qty 250

## 2020-08-21 NOTE — Patient Instructions (Signed)

## 2020-08-21 NOTE — Progress Notes (Signed)
Patient states he cannot take imodium as ordered. Offered to put capsules in applesauce; refused. Patient states his family gives him medication through tube. Pt not agreeable to allowing writer to give through feeding tube. Pt requesting to have it sent to pharmacy to take at home.

## 2020-08-22 ENCOUNTER — Other Ambulatory Visit: Payer: Self-pay

## 2020-08-22 ENCOUNTER — Telehealth: Payer: Self-pay | Admitting: Nurse Practitioner

## 2020-08-22 ENCOUNTER — Encounter: Payer: Self-pay | Admitting: Nurse Practitioner

## 2020-08-22 ENCOUNTER — Ambulatory Visit
Admission: RE | Admit: 2020-08-22 | Discharge: 2020-08-22 | Disposition: A | Discharge status: Home / Self Care | Payer: Medicare HMO | Source: Ambulatory Visit | Attending: Radiation Oncology | Admitting: Radiation Oncology

## 2020-08-22 DIAGNOSIS — C111 Malignant neoplasm of posterior wall of nasopharynx: Secondary | ICD-10-CM | POA: Diagnosis not present

## 2020-08-22 NOTE — Telephone Encounter (Signed)
Pt requested radiation appt to be closer to infusion appt on 9/27. Spoke with radiation and there's no available times to rescheduled. Unable to reach pt. Left voicemail to notify pt.

## 2020-08-22 NOTE — Telephone Encounter (Signed)
Scheduled per 9/23 los. Spoke with pt's wife and is aware of appt times and dates.

## 2020-08-25 ENCOUNTER — Other Ambulatory Visit: Payer: Self-pay

## 2020-08-25 ENCOUNTER — Inpatient Hospital Stay: Payer: Medicare HMO

## 2020-08-25 ENCOUNTER — Ambulatory Visit
Admission: RE | Admit: 2020-08-25 | Discharge: 2020-08-25 | Disposition: A | Discharge status: Home / Self Care | Payer: Medicare HMO | Source: Ambulatory Visit | Attending: Radiation Oncology | Admitting: Radiation Oncology

## 2020-08-25 VITALS — BP 102/59 | HR 80 | Temp 98.1°F | Resp 18

## 2020-08-25 DIAGNOSIS — C111 Malignant neoplasm of posterior wall of nasopharynx: Secondary | ICD-10-CM | POA: Diagnosis not present

## 2020-08-25 DIAGNOSIS — C119 Malignant neoplasm of nasopharynx, unspecified: Secondary | ICD-10-CM

## 2020-08-25 DIAGNOSIS — Z5111 Encounter for antineoplastic chemotherapy: Secondary | ICD-10-CM | POA: Diagnosis not present

## 2020-08-25 MED ORDER — HEPARIN SOD (PORK) LOCK FLUSH 100 UNIT/ML IV SOLN
500.0000 [IU] | Freq: Once | INTRAVENOUS | Status: AC | PRN
  Administered 2020-08-25: 500 [IU]
  Filled 2020-08-25: qty 5

## 2020-08-25 MED ORDER — SODIUM CHLORIDE 0.9% FLUSH
10.0000 mL | Freq: Once | INTRAVENOUS | Status: AC | PRN
  Administered 2020-08-25: 10 mL
  Filled 2020-08-25: qty 10

## 2020-08-25 MED ORDER — SODIUM CHLORIDE 0.9 % IV SOLN
Freq: Once | INTRAVENOUS | Status: AC
  Filled 2020-08-25: qty 250

## 2020-08-25 MED FILL — Dexamethasone Sodium Phosphate Inj 100 MG/10ML: INTRAMUSCULAR | Qty: 1 | Status: AC

## 2020-08-25 MED FILL — Fosaprepitant Dimeglumine For IV Infusion 150 MG (Base Eq): INTRAVENOUS | Qty: 5 | Status: AC

## 2020-08-25 NOTE — Patient Instructions (Signed)

## 2020-08-26 ENCOUNTER — Inpatient Hospital Stay: Payer: Medicare HMO

## 2020-08-26 ENCOUNTER — Other Ambulatory Visit: Payer: Self-pay | Admitting: Oncology

## 2020-08-26 ENCOUNTER — Telehealth: Payer: Self-pay

## 2020-08-26 ENCOUNTER — Other Ambulatory Visit: Payer: Self-pay

## 2020-08-26 ENCOUNTER — Ambulatory Visit
Admission: RE | Admit: 2020-08-26 | Discharge: 2020-08-26 | Disposition: A | Discharge status: Home / Self Care | Payer: Medicare HMO | Source: Ambulatory Visit | Attending: Radiation Oncology | Admitting: Radiation Oncology

## 2020-08-26 VITALS — BP 119/67 | HR 68 | Temp 98.4°F | Resp 18

## 2020-08-26 DIAGNOSIS — C111 Malignant neoplasm of posterior wall of nasopharynx: Secondary | ICD-10-CM | POA: Diagnosis not present

## 2020-08-26 DIAGNOSIS — C119 Malignant neoplasm of nasopharynx, unspecified: Secondary | ICD-10-CM

## 2020-08-26 DIAGNOSIS — Z5111 Encounter for antineoplastic chemotherapy: Secondary | ICD-10-CM | POA: Diagnosis not present

## 2020-08-26 LAB — CMP (CANCER CENTER ONLY)
ALT: 53 U/L — ABNORMAL HIGH (ref 0–44)
AST: 32 U/L (ref 15–41)
Albumin: 3 g/dL — ABNORMAL LOW (ref 3.5–5.0)
Alkaline Phosphatase: 90 U/L (ref 38–126)
Anion gap: 7 (ref 5–15)
BUN: 39 mg/dL — ABNORMAL HIGH (ref 8–23)
CO2: 29 mmol/L (ref 22–32)
Calcium: 8.2 mg/dL — ABNORMAL LOW (ref 8.9–10.3)
Chloride: 92 mmol/L — ABNORMAL LOW (ref 98–111)
Creatinine: 0.79 mg/dL (ref 0.61–1.24)
GFR, Est AFR Am: >60 mL/min (ref >60)
GFR, Estimated: >60 mL/min (ref >60)
Glucose, Bld: 102 mg/dL — ABNORMAL HIGH (ref 70–99)
Potassium: 3 mmol/L — CL (ref 3.5–5.1)
Sodium: 128 mmol/L — ABNORMAL LOW (ref 135–145)
Total Bilirubin: 0.6 mg/dL (ref 0.3–1.2)
Total Protein: 6.1 g/dL — ABNORMAL LOW (ref 6.5–8.1)

## 2020-08-26 LAB — CBC WITH DIFFERENTIAL (CANCER CENTER ONLY)
Abs Immature Granulocytes: 0.02 10*3/uL (ref 0.00–0.07)
Basophils Absolute: 0 10*3/uL (ref 0.0–0.1)
Basophils Relative: 0 %
Eosinophils Absolute: 0 10*3/uL (ref 0.0–0.5)
Eosinophils Relative: 0 %
HCT: 31.3 % — ABNORMAL LOW (ref 39.0–52.0)
Hemoglobin: 10.8 g/dL — ABNORMAL LOW (ref 13.0–17.0)
Immature Granulocytes: 1 %
Lymphocytes Relative: 9 %
Lymphs Abs: 0.3 10*3/uL — ABNORMAL LOW (ref 0.7–4.0)
MCH: 30.8 pg (ref 26.0–34.0)
MCHC: 34.5 g/dL (ref 30.0–36.0)
MCV: 89.2 fL (ref 80.0–100.0)
Monocytes Absolute: 0.3 10*3/uL (ref 0.1–1.0)
Monocytes Relative: 10 %
Neutro Abs: 2.4 10*3/uL (ref 1.7–7.7)
Neutrophils Relative %: 80 %
Platelet Count: 128 10*3/uL — ABNORMAL LOW (ref 150–400)
RBC: 3.51 MIL/uL — ABNORMAL LOW (ref 4.22–5.81)
RDW: 13.5 % (ref 11.5–15.5)
WBC Count: 3 10*3/uL — ABNORMAL LOW (ref 4.0–10.5)
nRBC: 0 % (ref 0.0–0.2)

## 2020-08-26 MED ORDER — HEPARIN SOD (PORK) LOCK FLUSH 100 UNIT/ML IV SOLN
500.0000 [IU] | INTRAVENOUS | Status: AC | PRN
  Administered 2020-08-26: 500 [IU]
  Filled 2020-08-26: qty 5

## 2020-08-26 MED ORDER — SODIUM CHLORIDE 0.9% FLUSH
10.0000 mL | INTRAVENOUS | Status: DC | PRN
  Filled 2020-08-26: qty 10

## 2020-08-26 MED ORDER — POTASSIUM CHLORIDE 10 MEQ/100ML IV SOLN
10.0000 meq | INTRAVENOUS | Status: AC
  Administered 2020-08-26 (×2): 10 meq via INTRAVENOUS

## 2020-08-26 MED ORDER — SODIUM CHLORIDE 0.9% FLUSH
10.0000 mL | Freq: Once | INTRAVENOUS | Status: AC | PRN
  Administered 2020-08-26: 10 mL
  Filled 2020-08-26: qty 10

## 2020-08-26 MED ORDER — SODIUM CHLORIDE 0.9 % IV SOLN
INTRAVENOUS | Status: DC
  Filled 2020-08-26: qty 250

## 2020-08-26 MED ORDER — SODIUM CHLORIDE 0.9 % IV SOLN
Freq: Once | INTRAVENOUS | Status: AC
  Filled 2020-08-26: qty 250

## 2020-08-26 MED ORDER — POTASSIUM CHLORIDE 10 MEQ/100ML IV SOLN
INTRAVENOUS | Status: AC
  Filled 2020-08-26: qty 200

## 2020-08-26 MED ORDER — HEPARIN SOD (PORK) LOCK FLUSH 100 UNIT/ML IV SOLN
500.0000 [IU] | Freq: Once | INTRAVENOUS | Status: DC | PRN
  Filled 2020-08-26: qty 5

## 2020-08-26 NOTE — Patient Instructions (Signed)

## 2020-08-26 NOTE — Progress Notes (Signed)
Nutrition Follow-up:  Patient with nasopharyngeal cancer.  Receiving chemotherapy and radiation therapy.   Met with patient during infusion.  Patient reports that he is not nauseated.  States that the vanilla tube feeding makes him nauseated and sick.  Showed him a bottle of osmolite 1.5 and he said that the smell of this was making him sick.  Showed RD a carton of chocolate premier protein that he had in a bag and says that he has been giving this 4-6 times per day but called it ensure/boost (160 calories and 30 g protein).  "I can't tolerate the vanilla shakes."  Sister and son have been buying shakes for him.    Medications: reviewed  Labs: K 3.0, Na 128  Anthropometrics:   Weight 110 lb 7 oz per [facility] on 9/27 116 lb 9.6 oz on 9/23   Estimated Energy Needs  Kcals: 1800-2000 Protein: 62-82 g Fluid: 2 L  NUTRITION DIAGNOSIS: Unintentional weight loss continues   INTERVENTION:  Patient to try few drops of chocolate flavoring in osmolite 1.5 feeding (has 2 cases on hand).  Goal to give at least 5 cartons per day. Other option would be for patient to use ensure plus/ensure enlive or boost plus.  Discussed this with him and wrote options down to give to sister and son to provided more calories.  Contact information provided to patient    MONITORING, EVALUATION, GOAL: weight trends, tube feeding   NEXT VISIT: Tuesday, October 5 ([place])  Sandhya Denherder B. Zenia Resides, [place], [place] Registered Dietitian [phone] (mobile)

## 2020-08-26 NOTE — Patient Instructions (Signed)

## 2020-08-26 NOTE — Telephone Encounter (Signed)
CRITICAL VALUE STICKER  CRITICAL VALUE: Potassium 3.0  RECEIVER (on-site recipient of call): Wendall Mola, RN  [place] NOTIFIED: 08/26/20 @ 0916  MESSENGER (representative from lab): Sherre Lain, MT  MD NOTIFIED: Dr. Zola Button  TIME OF NOTIFICATION: [phone]  RESPONSE: Acknowledged. Patient to receive two runs of 10 mEq potassium IV today. Infusion RN made aware and pharmacy made aware.

## 2020-08-27 ENCOUNTER — Other Ambulatory Visit: Payer: Self-pay

## 2020-08-27 ENCOUNTER — Ambulatory Visit
Admission: RE | Admit: 2020-08-27 | Discharge: 2020-08-27 | Disposition: A | Discharge status: Home / Self Care | Payer: Medicare HMO | Source: Ambulatory Visit | Attending: Radiation Oncology | Admitting: Radiation Oncology

## 2020-08-27 DIAGNOSIS — C111 Malignant neoplasm of posterior wall of nasopharynx: Secondary | ICD-10-CM | POA: Diagnosis not present

## 2020-08-28 ENCOUNTER — Inpatient Hospital Stay (HOSPITAL_BASED_OUTPATIENT_CLINIC_OR_DEPARTMENT_OTHER): Payer: Medicare HMO | Admitting: Medical

## 2020-08-28 ENCOUNTER — Inpatient Hospital Stay: Payer: Medicare HMO | Admitting: General Practice

## 2020-08-28 ENCOUNTER — Other Ambulatory Visit: Payer: Self-pay | Admitting: Radiation Oncology

## 2020-08-28 ENCOUNTER — Ambulatory Visit
Admission: RE | Admit: 2020-08-28 | Discharge: 2020-08-28 | Disposition: A | Discharge status: Home / Self Care | Payer: Medicare HMO | Source: Ambulatory Visit | Attending: Radiation Oncology | Admitting: Radiation Oncology

## 2020-08-28 ENCOUNTER — Other Ambulatory Visit: Payer: Self-pay

## 2020-08-28 ENCOUNTER — Ambulatory Visit: Payer: Medicare HMO | Admitting: Neurology

## 2020-08-28 ENCOUNTER — Ambulatory Visit: Payer: Medicare HMO

## 2020-08-28 ENCOUNTER — Other Ambulatory Visit: Payer: Self-pay | Admitting: Medical

## 2020-08-28 VITALS — BP 152/78 | HR 70 | Temp 97.6°F | Resp 18 | Ht 65.0 in | Wt 116.0 lb

## 2020-08-28 DIAGNOSIS — G893 Neoplasm related pain (acute) (chronic): Secondary | ICD-10-CM

## 2020-08-28 DIAGNOSIS — C119 Malignant neoplasm of nasopharynx, unspecified: Secondary | ICD-10-CM

## 2020-08-28 DIAGNOSIS — Z95828 Presence of other vascular implants and grafts: Secondary | ICD-10-CM

## 2020-08-28 DIAGNOSIS — C111 Malignant neoplasm of posterior wall of nasopharynx: Secondary | ICD-10-CM

## 2020-08-28 DIAGNOSIS — Z5111 Encounter for antineoplastic chemotherapy: Secondary | ICD-10-CM | POA: Diagnosis not present

## 2020-08-28 MED ORDER — SODIUM CHLORIDE 0.9% FLUSH
10.0000 mL | INTRAVENOUS | Status: DC | PRN
  Administered 2020-08-28: 10 mL via INTRAVENOUS
  Filled 2020-08-28: qty 10

## 2020-08-28 MED ORDER — SODIUM CHLORIDE 0.9 % IV SOLN
Freq: Once | INTRAVENOUS | Status: AC
  Filled 2020-08-28: qty 250

## 2020-08-28 MED ORDER — FENTANYL 25 MCG/HR TD PT72: 1.0000 | MEDICATED_PATCH | TRANSDERMAL | 0 refills | Status: AC

## 2020-08-28 MED ORDER — OXYCODONE HCL 5 MG/5ML PO SOLN: 5.0000 mg | ORAL | 0 refills | Status: DC | PRN

## 2020-08-28 MED ORDER — HEPARIN SOD (PORK) LOCK FLUSH 100 UNIT/ML IV SOLN
500.0000 [IU] | Freq: Once | INTRAVENOUS | Status: AC
  Administered 2020-08-28: 500 [IU] via INTRAVENOUS
  Filled 2020-08-28: qty 5

## 2020-08-28 MED FILL — fentaNYL 50 MCG/HR PT72: 50 | 30 days supply | Qty: 10 | Fill #0

## 2020-08-28 MED FILL — oxyCODONE HCL 5 MG/5ML SOLN: 5 | 8 days supply | Qty: 240 | Fill #0

## 2020-08-28 MED FILL — FENTANYL 25 MCG/HR PT72: 25 | 30 days supply | Qty: 10 | Fill #0

## 2020-08-28 NOTE — Telephone Encounter (Signed)
Lucianne Lei, Sending to you for approval or refusal. Gardiner Rhyme, RN

## 2020-08-28 NOTE — Patient Instructions (Signed)

## 2020-08-28 NOTE — Progress Notes (Signed)
[place] CSW Progress Notes  Met w patient and brother in law in exam room for check in visit.  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  He may have "over exerted" himself by walking, but is really enjoying being able to engage his creative side.  Patient gives a "thumbs up" when asked how he is doing.  Also reports he has gained 5 pounds and is tolerating treatment well.  Will see him on 10/7 when he is scheduled for infusion.  Edwyna Shell, LCSW Clinical Social Worker Phone:  [phone] Cell:  [phone]

## 2020-08-29 ENCOUNTER — Other Ambulatory Visit: Payer: Self-pay

## 2020-08-29 ENCOUNTER — Ambulatory Visit
Admission: RE | Admit: 2020-08-29 | Discharge: 2020-08-29 | Disposition: A | Discharge status: Home / Self Care | Payer: Medicare HMO | Source: Ambulatory Visit | Attending: Radiation Oncology | Admitting: Radiation Oncology

## 2020-08-29 DIAGNOSIS — C119 Malignant neoplasm of nasopharynx, unspecified: Secondary | ICD-10-CM | POA: Insufficient documentation

## 2020-08-29 DIAGNOSIS — C111 Malignant neoplasm of posterior wall of nasopharynx: Secondary | ICD-10-CM | POA: Diagnosis not present

## 2020-08-29 NOTE — Progress Notes (Signed)
Symptoms Management Clinic Progress Note   Willie Kennedy [id number] 05-21-1951 69 y.o.  Willie Kennedy is managed by Dr. Alen Blew  Actively treated with chemotherapy/immunotherapy/hormonal therapy: yes  Current therapy: Concurrent chemoradiation with cisplatin  Last treated: 08/19/2020 (cycle 4, day 1 of cisplatin) with daily radiation  Next scheduled appointment with provider: 09/01/2020  Assessment: Plan:    Nasopharyngeal cancer ([place]) - Plan: 0.9 %  sodium chloride infusion  Neoplasm related pain - Plan: oxyCODONE (ROXICODONE) 5 MG/5ML solution, fentaNYL (DURAGESIC) 25 MCG/HR  Port-A-Cath in place - Plan: heparin lock flush 100 unit/mL, sodium chloride flush (NS) 0.9 % injection 10 mL  Nasopharyngeal cancer: The patient is managed by Dr. Alen Blew and is status post cycle 4, day 1 of cisplatin which was dosed on 08/19/2020.  He continues to receive daily radiation. He is scheduled to be seen next on 09/01/2020.  He was given 1 L of normal saline IV today.  Neoplastic related pain: The patient had his Duragesic increased to 75 mcg q. 3 days.  He was given an extra prescription for 25 mcg patches and was told to add these to his 50 mcg patches for total dose of 75 mcg.  He was also given a refill of Roxicodone solution.  Please see After Visit Summary for patient specific instructions.  Future Appointments  Date Time Provider [place]  09/01/2020  8:00 AM [facility] LAB [facility] None  09/01/2020  8:15 AM [facility] [place] FLUSH [facility] None  09/01/2020  8:45 AM Wyatt Portela, MD [facility] None  09/01/2020  9:30 AM [facility] LINAC 4 [facility] None  09/02/2020  8:15 AM [facility] [id number] [facility] None  09/02/2020  8:30 AM [facility] INFUSION [facility] None  09/02/2020  9:00 AM Neff, Barbara L, RD [facility] None  09/03/2020 10:15 AM [facility] LINAC 4 [facility] None  09/04/2020  8:15 AM [facility] INFUSION [facility] None  09/04/2020 10:00 AM Cunningham,  Anne C, LCSW [facility] None  09/04/2020 10:15 AM [facility] LINAC 4 [facility] None  09/05/2020 10:15 AM [facility] LINAC 4 [facility] None  09/08/2020 10:15 AM [facility] LINAC 4 [facility] None  09/09/2020  7:45 AM [facility] LAB [facility] None  09/09/2020  8:00 AM [facility] [place] FLUSH [facility] None  09/09/2020  8:15 AM [facility] [id number] [facility] None  09/09/2020  8:30 AM Shadad, Mathis Dad, MD [facility] None  09/09/2020  9:00 AM [facility] INFUSION [facility] None  09/09/2020  9:45 AM Jennet Maduro, RD [facility] None  09/10/2020 10:15 AM [facility] LINAC 4 [facility] None  09/11/2020 10:15 AM [facility] LINAC 4 [facility] None  09/12/2020 10:15 AM [facility] LINAC 4 [facility] None  09/15/2020 10:15 AM [facility] LINAC 4 [facility] None  09/16/2020 10:15 AM [facility] LINAC 4 [facility] None  09/17/2020 10:15 AM [facility] LINAC 4 [facility] None  09/18/2020  2:45 PM Schinke, Carl B, CCC-SLP [facility]    No orders of the defined types were placed in this encounter.      Subjective:   Patient ID:  Willie Kennedy is a 69 y.o. (DOB 04/30/1951) male.  Chief Complaint: No chief complaint on file.   HPI Willie Kennedy  is a 69 y.o. 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  He is followed by Dr. Alen Blew and is status post cycle 4, day 1 of cisplatin which he received on 08/19/2020.  He continues to receive daily concurrent radiation therapy.  He presents to the clinic today with continued pain in his right lateral skull.  He is currently  on Duragesic 50 mcg patches 1 q. 3 days and Roxicodone solution for breakthrough pain.  He has been taking his Roxicodone solution a little more frequently lately.  His weight has increased.  His nausea has improved.  His GERD has resolved.  He reports that he is doing some better.  Medications: I have reviewed the patient's current medications.  Allergies: No Known  Allergies  Past Medical History:  Diagnosis Date  . Biceps tendon rupture    h/o Right biceps tendon rupture  . CAD (coronary artery disease)    a. ETT myoview (10/11) with EF 52%, reversible inferior perfusion defect, 1 mm inferolateral ST depression with exercise, 7"16" and stopped due to dyspnea and leg weakness -> LHC (10/11), EF 55%, 40% pRCA, 40% mRCA, 40% prox to mid CFX. medical treatment.  . Cancer ([place])    skin cancer behind left ear   . Dizziness    with bending over   . Hard of hearing    more per right ear  . Headache    with bright light   . History of bronchitis   . History of hiatal hernia   . Hyperlipidemia   . Impaired glucose tolerance   . Numbness and tingling in left arm    if holds arm upward too long pt states his primary MD is aware  . Osteoarthritis   . Peripheral vascular disease ([place]) 2005   a. s/p left fem-pop Dr.Lawson in 2005.  Marland Kitchen Shortness of breath dyspnea    walking distances or climbing stairs  . Tinnitus     Past Surgical History:  Procedure Laterality Date  . ds/p left femoral artery occlusion  2005   s/p left fem-pop bypass- Dr. Kellie Simmering  . HERNIA REPAIR    . IR GASTROSTOMY TUBE MOD SED  07/24/2020  . IR IMAGING GUIDED PORT INSERTION  07/24/2020  . MULTIPLE EXTRACTIONS WITH ALVEOLOPLASTY N/A 07/17/2020   Procedure: Extraction of tooth #'s 9,45,03,88,EKC 31 with alveoloplasty;  Surgeon: Lenn Cal, DDS;  Location: [facility];  Service: Oral Surgery;  Laterality: N/A;  . MYRINGOTOMY WITH TUBE PLACEMENT Right 06/26/2020   Procedure: MYRINGOTOMY WITH TUBE PLACEMENT RIGHT  EAR;  Surgeon: Melida Quitter, MD;  Location: [facility];  Service: ENT;  Laterality: Right;  . NASAL ENDOSCOPY N/A 06/26/2020   Procedure: NASAL ENDOSCOPY W/NASOPHARYNGEAL BIOPSY;  Surgeon: Melida Quitter, MD;  Location: [facility];  Service: ENT;  Laterality: N/A;  . PR VEIN BYPASS GRAFT,AORTO-FEM-POP    . s/p right bicep tendon rupture repair     . VENTRAL HERNIA REPAIR N/A 08/22/2015   Procedure: OPEN VENTRAL HERNIA REPAIR WITH MESH;  Surgeon: Jackolyn Confer, MD;  Location: [facility];  Service: General;  Laterality: N/A;    Family History  Problem Relation Age of Onset  . Cervical cancer Mother   . Cancer Mother        cervical  . Lung cancer Father   . Cancer Father        lung    Social History   Socioeconomic History  . Marital status: Married    Spouse name: Not on file  . Number of children: 1  . Years of education: Not on file  . Highest education level: Not on file  Occupational History  . Occupation: Social research officer, government  Tobacco Use  . Smoking status: Former Smoker    Packs/day: 7.00    Years: 40.00    Pack years: 280.00    Types:  Cigarettes    Quit date: 11/29/1994    Years since quitting: 25.7  . Smokeless tobacco: Never Used  Vaping Use  . Vaping Use: Never used  Substance and Sexual Activity  . Alcohol use: No  . Drug use: No  . Sexual activity: Not Currently  Other Topics Concern  . Not on file  Social History Narrative   Right handed      Highest level of edu- 11th grade      Lives in one story home      Social Determinants of Health   Financial Resource Strain: Medium Risk  . Difficulty of Paying Living Expenses: Somewhat hard  Food Insecurity: No Food Insecurity  . Worried About Charity fundraiser in the Last Year: Never true  . Ran Out of Food in the Last Year: Never true  Transportation Needs: No Transportation Needs  . Lack of Transportation (Medical): No  . Lack of Transportation (Non-Medical): No  Physical Activity:   . Days of Exercise per Week: Not on file  . Minutes of Exercise per Session: Not on file  Stress:   . Feeling of Stress : Not on file  Social Connections:   . Frequency of Communication with Friends and Family: Not on file  . Frequency of Social Gatherings with Friends and Family: Not on file  . Attends Religious Services: Not on file  . Active Member of  Clubs or Organizations: Not on file  . Attends Archivist Meetings: Not on file  . Marital Status: Not on file  Intimate Partner Violence:   . Fear of Current or Ex-Partner: Not on file  . Emotionally Abused: Not on file  . Physically Abused: Not on file  . Sexually Abused: Not on file    Past Medical History, Surgical history, Social history, and Family history were reviewed and updated as appropriate.   Please see review of systems for further details on the patient's review from today.   Review of Systems:  Review of Systems  Constitutional: Positive for appetite change (Appetite is improving). Negative for chills, diaphoresis and fever.  HENT: Negative for sore throat and trouble swallowing.   Respiratory: Negative for cough, choking, shortness of breath and wheezing.   Cardiovascular: Negative for chest pain and palpitations.  Gastrointestinal: Negative for constipation, diarrhea, nausea and vomiting.  Genitourinary: Negative for decreased urine volume.  Neurological: Negative for headaches.       Pain along the right lateral skull.    Objective:   Physical Exam:  BP (!) 152/78 (BP Location: Left Arm, Patient Position: Sitting)   Pulse 70   Temp 97.6 F (36.4 C) (Tympanic)   Resp 18   Ht 5\' 5"  (1.651 m)   Wt 116 lb (52.6 kg)   SpO2 98%   BMI 19.30 kg/m  ECOG: 2  Physical Exam Constitutional:      General: He is not in acute distress.    Appearance: He is not diaphoretic.     Comments: The patient is lying supine in a recliner.  HENT:     Head: Normocephalic and atraumatic.  Cardiovascular:     Rate and Rhythm: Normal rate and regular rhythm.     Heart sounds: Normal heart sounds. No murmur heard.  No friction rub. No gallop.   Pulmonary:     Effort: Pulmonary effort is normal. No respiratory distress.     Breath sounds: Normal breath sounds. No wheezing or rales.  Abdominal:     General:  Bowel sounds are normal. There is no distension.      Tenderness: There is no abdominal tenderness.     Comments: There is a PEG tube in the left upper abdomen.  No exudate is noted.  Skin:    General: Skin is warm and dry.     Findings: No erythema or rash.     Lab Review:     Component Value Date/Time   NA 128 (L) 08/26/2020 0821   K 3.0 (LL) 08/26/2020 0821   CL 92 (L) 08/26/2020 0821   CO2 29 08/26/2020 0821   GLUCOSE 102 (H) 08/26/2020 0821   BUN 39 (H) 08/26/2020 0821   CREATININE 0.79 08/26/2020 0821   CREATININE 1.00 06/27/2015 1222   CALCIUM 8.2 (L) 08/26/2020 0821   PROT 6.1 (L) 08/26/2020 0821   ALBUMIN 3.0 (L) 08/26/2020 0821   AST 32 08/26/2020 0821   ALT 53 (H) 08/26/2020 0821   ALKPHOS 90 08/26/2020 0821   BILITOT 0.6 08/26/2020 0821   GFRNONAA >60 08/26/2020 0821   GFRAA >60 08/26/2020 0821       Component Value Date/Time   WBC 3.0 (L) 08/26/2020 0821   WBC 11.8 (H) 07/29/2020 1137   RBC 3.51 (L) 08/26/2020 0821   HGB 10.8 (L) 08/26/2020 0821   HCT 31.3 (L) 08/26/2020 0821   PLT 128 (L) 08/26/2020 0821   MCV 89.2 08/26/2020 0821   MCH 30.8 08/26/2020 0821   MCHC 34.5 08/26/2020 0821   RDW 13.5 08/26/2020 0821   LYMPHSABS 0.3 (L) 08/26/2020 0821   MONOABS 0.3 08/26/2020 0821   EOSABS 0.0 08/26/2020 0821   BASOSABS 0.0 08/26/2020 0821   -------------------------------  Imaging from last 24 hours (if applicable):  Radiology interpretation: No results found.      This case was discussed Dr. Alen Blew. He expressed agreement with my management of this patient.

## 2020-08-30 ENCOUNTER — Ambulatory Visit: Payer: Medicare HMO

## 2020-08-30 MED FILL — METOCLOPRAMIDE 5 MG/5 ML SY: 10 | 16 days supply | Qty: 240 | Fill #1

## 2020-09-01 ENCOUNTER — Inpatient Hospital Stay: Payer: Medicare HMO

## 2020-09-01 ENCOUNTER — Other Ambulatory Visit (HOSPITAL_COMMUNITY): Payer: Self-pay | Admitting: Oncology

## 2020-09-01 ENCOUNTER — Inpatient Hospital Stay: Payer: Medicare HMO | Attending: Oncology | Admitting: Oncology

## 2020-09-01 ENCOUNTER — Other Ambulatory Visit: Payer: Self-pay

## 2020-09-01 ENCOUNTER — Ambulatory Visit
Admission: RE | Admit: 2020-09-01 | Discharge: 2020-09-01 | Disposition: A | Discharge status: Home / Self Care | Payer: Medicare HMO | Source: Ambulatory Visit | Attending: Radiation Oncology | Admitting: Radiation Oncology

## 2020-09-01 VITALS — BP 120/66 | HR 85 | Temp 97.9°F | Resp 18 | Ht 65.0 in | Wt 116.9 lb

## 2020-09-01 DIAGNOSIS — E871 Hypo-osmolality and hyponatremia: Secondary | ICD-10-CM | POA: Diagnosis not present

## 2020-09-01 DIAGNOSIS — C111 Malignant neoplasm of posterior wall of nasopharynx: Secondary | ICD-10-CM

## 2020-09-01 DIAGNOSIS — C119 Malignant neoplasm of nasopharynx, unspecified: Secondary | ICD-10-CM

## 2020-09-01 DIAGNOSIS — Z79899 Other long term (current) drug therapy: Secondary | ICD-10-CM | POA: Insufficient documentation

## 2020-09-01 DIAGNOSIS — E860 Dehydration: Secondary | ICD-10-CM | POA: Diagnosis not present

## 2020-09-01 LAB — CMP (CANCER CENTER ONLY)
ALT: 31 U/L (ref 0–44)
AST: 25 U/L (ref 15–41)
Albumin: 2.8 g/dL — ABNORMAL LOW (ref 3.5–5.0)
Alkaline Phosphatase: 91 U/L (ref 38–126)
Anion gap: 8 (ref 5–15)
BUN: 24 mg/dL — ABNORMAL HIGH (ref 8–23)
CO2: 33 mmol/L — ABNORMAL HIGH (ref 22–32)
Calcium: 8.9 mg/dL (ref 8.9–10.3)
Chloride: 89 mmol/L — ABNORMAL LOW (ref 98–111)
Creatinine: 0.81 mg/dL (ref 0.61–1.24)
GFR, Est AFR Am: >60 mL/min (ref >60)
GFR, Estimated: >60 mL/min (ref >60)
Glucose, Bld: 122 mg/dL — ABNORMAL HIGH (ref 70–99)
Potassium: 3.9 mmol/L (ref 3.5–5.1)
Sodium: 130 mmol/L — ABNORMAL LOW (ref 135–145)
Total Bilirubin: 0.7 mg/dL (ref 0.3–1.2)
Total Protein: 6.3 g/dL — ABNORMAL LOW (ref 6.5–8.1)

## 2020-09-01 LAB — CBC WITH DIFFERENTIAL (CANCER CENTER ONLY)
Abs Immature Granulocytes: 0.01 10*3/uL (ref 0.00–0.07)
Basophils Absolute: 0 10*3/uL (ref 0.0–0.1)
Basophils Relative: 1 %
Eosinophils Absolute: 0 10*3/uL (ref 0.0–0.5)
Eosinophils Relative: 1 %
HCT: 30.7 % — ABNORMAL LOW (ref 39.0–52.0)
Hemoglobin: 10.4 g/dL — ABNORMAL LOW (ref 13.0–17.0)
Immature Granulocytes: 1 %
Lymphocytes Relative: 7 %
Lymphs Abs: 0.1 10*3/uL — ABNORMAL LOW (ref 0.7–4.0)
MCH: 31.6 pg (ref 26.0–34.0)
MCHC: 33.9 g/dL (ref 30.0–36.0)
MCV: 93.3 fL (ref 80.0–100.0)
Monocytes Absolute: 0.3 10*3/uL (ref 0.1–1.0)
Monocytes Relative: 14 %
Neutro Abs: 1.4 10*3/uL — ABNORMAL LOW (ref 1.7–7.7)
Neutrophils Relative %: 76 %
Platelet Count: 185 10*3/uL (ref 150–400)
RBC: 3.29 MIL/uL — ABNORMAL LOW (ref 4.22–5.81)
RDW: 14.6 % (ref 11.5–15.5)
WBC Count: 1.9 10*3/uL — ABNORMAL LOW (ref 4.0–10.5)
nRBC: 0 % (ref 0.0–0.2)

## 2020-09-01 LAB — MAGNESIUM: Magnesium: 1.9 mg/dL (ref 1.7–2.4)

## 2020-09-01 MED ORDER — SODIUM CHLORIDE 0.9% FLUSH
10.0000 mL | Freq: Once | INTRAVENOUS | Status: AC | PRN
  Administered 2020-09-01: 10 mL
  Filled 2020-09-01: qty 10

## 2020-09-01 MED ORDER — AUGMENTIN 125-31.25 MG/5ML PO SUSR: 125.0000 mg | Freq: Three times a day (TID) | ORAL | 0 refills | Status: DC

## 2020-09-01 MED ORDER — SONAFINE EX EMUL
1.0000 "application " | Freq: Two times a day (BID) | CUTANEOUS | Status: DC
  Administered 2020-09-01: 1 via TOPICAL

## 2020-09-01 MED ORDER — HEPARIN SOD (PORK) LOCK FLUSH 100 UNIT/ML IV SOLN
500.0000 [IU] | Freq: Once | INTRAVENOUS | Status: AC | PRN
  Administered 2020-09-01: 500 [IU]
  Filled 2020-09-01: qty 5

## 2020-09-01 MED FILL — AMOX TR-K CLV 250-62.5/5 SU: 250-62.5 | 10 days supply | Qty: 100 | Fill #0

## 2020-09-01 MED FILL — AMOX-CLAV 250-62.5 MG/5 ML: 250-62.5 | 10 days supply | Qty: 100 | Fill #0 | Status: TO

## 2020-09-01 NOTE — Progress Notes (Signed)
Hematology and Oncology Follow Up Visit  Willie Kennedy [id number] Jun 30, 1951 69 y.o. 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    Prior Therapy:  He is status post nasopharyngeal biopsy and right myringotomy completed by Dr. Redmond Baseman on June 04, 2020.   Current therapy:  Radiation therapy with weekly cisplatin chemotherapy started on July 30, 2020.  He completed 4 weeks of cisplatin therapy.  Interim History: Willie Kennedy returns today for a follow-up visit.  Since the last visit, he reports few complaints at this time.  He reported improvement in his overall pain and discomfort that is manageable with increase fentanyl patch.  He continues to have ear pain and increased mucus production but overall performance status remains limited.  He does report lower extremity swelling no shortness of breath or difficulty breathing.  He is maintaining adequate nutrition via feeding tube.  .    Medications: Unchanged on review. Current Outpatient Medications  Medication Sig Dispense Refill  . atorvastatin (LIPITOR) 20 MG tablet Take 20 mg by mouth at bedtime.  (Patient not taking: Reported on 08/05/2020)    . dexamethasone (DECADRON) 0.5 MG/5ML solution Take 42mL BID by PEG (with food). On 9/14, taper to 30mL daily with food. On 9/21, taper to 82mL daily with food.  On 9/28, taper to 45mL daily with food. Stop on 10/9. 500 mL 2  . fentaNYL (DURAGESIC) 25 MCG/HR Place 1 patch onto the skin every 3 (three) days. Use with 50 microgram patch for total dose of 75 micrograms. Replace patch every 3 days. 10 patch 0  . fentaNYL (DURAGESIC) 50 MCG/HR APPLY 1 PATCH ONTO SKIN EVERY 3 DAYS 10 patch 0  . gabapentin (NEURONTIN) 300 MG capsule Take 2 capsules in AM, 3 capsules in evening (Patient not taking: Reported on 08/05/2020) 450 capsule 3  . lansoprazole (PREVACID  SOLUTAB) 15 MG disintegrating tablet Dissolve one tablet in mouth daily.  (Affordable alternative to previous liquid Rx for Omeprazole). 30 tablet 1  . lidocaine (XYLOCAINE) 2 % solution Patient: Mix 1part 2% viscous lidocaine, 1part H20. Swish & swallow 29mL of diluted mixture, 25min before meals and at bedtime, up to QID 200 mL 4  . lidocaine-prilocaine (EMLA) cream Apply 1 application topically as needed. (Patient not taking: Reported on 08/05/2020) 30 g 0  . metoCLOPramide (REGLAN) 5 MG/5ML solution Place 5 mLs (5 mg total) into feeding tube 3 (three) times daily before meals. 240 mL 2  . nitroGLYCERIN (NITROSTAT) 0.4 MG SL tablet Place 1 tablet (0.4 mg total) under the tongue every 5 (five) minutes as needed. Place 1 tablet under tongue for chest pain -- not to exceed 3 in 15 minutes (Patient not taking: Reported on 07/31/2020) 100 tablet 11  . nortriptyline (PAMELOR) 10 MG capsule Take 1 capsule every night for 1 week, then increase to 2 capsules every night (Patient not taking: Reported on 08/05/2020) 60 capsule 11  . ondansetron (ZOFRAN ODT) 4 MG disintegrating tablet Take 1 tablet (4 mg total) by mouth every 8 (eight) hours as needed for nausea or vomiting. (Patient not taking: Reported on 08/05/2020) 20 tablet 0  . ondansetron (ZOFRAN) 4 MG/5ML solution Take 7.5 mLs (6 mg total) by mouth every 8 (eight) hours as needed for nausea or vomiting. Okay to take by tube, if preferred. Take around the clock, 3 x's daily, if nausea is severe. [place]  mL 5  . oxyCODONE (ROXICODONE) 5 MG/5ML solution Place 5 mLs (5 mg total) into feeding tube every 4 (four) hours as needed for severe pain. 240 mL 0  . prochlorperazine (COMPAZINE) 10 MG tablet Take 1 tablet (10 mg total) by mouth every 6 (six) hours as needed for nausea or vomiting. (Patient not taking: Reported on 07/31/2020) 30 tablet 0  . promethazine (PHENERGAN) 6.25 MG/5ML syrup 6.25 to 12.5 mg Q 6 hours VT prn nausea 473 mL 2  . rOPINIRole (REQUIP) 0.25 MG tablet  TAKE 1 TABLET BY MOUTH 2 HOURS BEFORE BEDTIME (Patient not taking: Reported on 08/05/2020) 90 tablet 3  . tiZANidine (ZANAFLEX) 4 MG tablet Take 1 tablet (4 mg total) by mouth at bedtime. (Patient not taking: Reported on 08/05/2020) 30 tablet 11  . traMADol (ULTRAM) 50 MG tablet TAKE 1 TABLET BY MOUTH EVERY NIGHT (Patient not taking: Reported on 08/05/2020) 90 tablet 3   No current facility-administered medications for this visit.   Facility-Administered Medications Ordered in Other Visits  Medication Dose Route Frequency Provider Last Rate Last Admin  . heparin lock flush 100 unit/mL  250 Units Intracatheter Once PRN Wyatt Portela, MD      . sodium chloride flush (NS) 0.9 % injection 10 mL  10 mL Intracatheter Once PRN Wyatt Portela, MD      . sodium chloride flush (NS) 0.9 % injection 10 mL  10 mL Intracatheter PRN Wyatt Portela, MD   10 mL at 07/30/20 1520     Allergies: No Known Allergies   Physical Exam:  Blood pressure 120/66, pulse 85, temperature 97.9 F (36.6 C), temperature source Tympanic, resp. rate 18, height 5\' 5"  (1.651 m), weight 116 lb 14.4 oz (53 kg), SpO2 95 %.     ECOG: 1      General appearance: Comfortable appearing without any discomfort Head: Normocephalic without any trauma Oropharynx: Mucous membranes are moist and pink without any thrush or ulcers. Ear examination showed right ear erythema and tenderness with mild drainage noted. Eyes: Sclera anicteric. Lymph nodes: No cervical, supraclavicular, inguinal or axillary lymphadenopathy.   Heart:regular rate and rhythm.  S1 and S2 without leg edema. Lung: Clear without any rhonchi or wheezes.  No dullness to percussion. Abdomin: Soft, nontender, nondistended with good bowel sounds.  No hepatosplenomegaly. Musculoskeletal: No joint deformity or effusion.  Full range of motion noted. Neurological: No deficits noted on motor, sensory and deep tendon reflex exam. Skin: No petechial rash or dryness.  Appeared  moist.        Lab Results: Lab Results  Component Value Date   WBC 3.0 (L) 08/26/2020   HGB 10.8 (L) 08/26/2020   HCT 31.3 (L) 08/26/2020   MCV 89.2 08/26/2020   PLT 128 (L) 08/26/2020     Chemistry      Component Value Date/Time   NA 128 (L) 08/26/2020 0821   K 3.0 (LL) 08/26/2020 0821   CL 92 (L) 08/26/2020 0821   CO2 29 08/26/2020 0821   BUN 39 (H) 08/26/2020 0821   CREATININE 0.79 08/26/2020 0821   CREATININE 1.00 06/27/2015 1222      Component Value Date/Time   CALCIUM 8.2 (L) 08/26/2020 0821   ALKPHOS 90 08/26/2020 0821   AST 32 08/26/2020 0821   ALT 53 (H) 08/26/2020 0821   BILITOT 0.6 08/26/2020 0821         Impression and Plan:  1.    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  He is currently receiving definitive therapy with radiation and weekly cisplatin.  Chemotherapy was held last week because of his overall frail status and failure to thrive.  Risks and benefits of resuming chemotherapy were discussed today.  Laboratory data from today reviewed and showed declining absolute neutrophil counts and will continue to hold chemotherapy for the time being.  2.  IV access: Port-A-Cath continues to be in place without any issues.  3.  Nutrition: He is receiving adequate nutrition via feeding tube.  4.  Antiemetics: No nausea or vomiting reported.  Compazine is available to him.  5.  Renal function surveillance: His creatinine remained stable although his BUN has increased indicating signs of prerenal azotemia.  He has received IV hydration instead of cisplatin last week.  6.  Pain: Fentanyl patch was increased last week to 75 mcg/h.  Pain is manageable at this time.   7.    Hyponatremia: Related to dehydration mostly rather than SIADH.  Improved with IV hydration.  8.  Ear pain: Could be developing otitis externa with tenderness to touch and possible drainage.  Will prescribe a course of amoxicillin.  9.  Follow-up:  He will continue to receive aggressive biweekly hydration we will continue to follow his clinical status closely.  30  minutes were spent on this encounter.  The time was dedicated to reviewing his disease status, discussing treatment options and future plan of care review.   Zola Button, MD 10/4/20218:33 AM

## 2020-09-02 ENCOUNTER — Inpatient Hospital Stay: Payer: Medicare HMO

## 2020-09-02 ENCOUNTER — Inpatient Hospital Stay: Payer: Medicare HMO | Admitting: Nutrition

## 2020-09-02 ENCOUNTER — Ambulatory Visit
Admission: RE | Admit: 2020-09-02 | Discharge: 2020-09-02 | Disposition: A | Discharge status: Home / Self Care | Payer: Medicare HMO | Source: Ambulatory Visit | Attending: Radiation Oncology | Admitting: Radiation Oncology

## 2020-09-02 ENCOUNTER — Other Ambulatory Visit: Payer: Self-pay

## 2020-09-02 VITALS — BP 126/74 | HR 84 | Temp 99.3°F | Resp 18

## 2020-09-02 DIAGNOSIS — C111 Malignant neoplasm of posterior wall of nasopharynx: Secondary | ICD-10-CM | POA: Diagnosis not present

## 2020-09-02 DIAGNOSIS — C119 Malignant neoplasm of nasopharynx, unspecified: Secondary | ICD-10-CM

## 2020-09-02 MED ORDER — SODIUM CHLORIDE 0.9% FLUSH
10.0000 mL | Freq: Once | INTRAVENOUS | Status: AC | PRN
  Administered 2020-09-02: 10 mL
  Filled 2020-09-02: qty 10

## 2020-09-02 MED ORDER — OSMOLITE 1.5 CAL PO LIQD: ORAL | 6 refills | Status: DC

## 2020-09-02 MED ORDER — HEPARIN SOD (PORK) LOCK FLUSH 100 UNIT/ML IV SOLN
500.0000 [IU] | Freq: Once | INTRAVENOUS | Status: AC | PRN
  Administered 2020-09-02: 500 [IU]
  Filled 2020-09-02: qty 5

## 2020-09-02 MED ORDER — SODIUM CHLORIDE 0.9 % IV SOLN
Freq: Once | INTRAVENOUS | Status: AC
  Filled 2020-09-02: qty 250

## 2020-09-02 NOTE — Patient Instructions (Signed)

## 2020-09-02 NOTE — Progress Notes (Signed)
Nutrition follow-up completed with patient during IV fluids for nasopharyngeal cancer. Patient reports his pain is finally controlled. He reports occasional nausea and vomiting.  Reports he takes antiemetics. This has improved and he has not experienced this for the past 4 days. Reports he is having constipation.  He is taking MiraLAX. Weight improved and documented as 116.9 pounds on October 4. Patient is not able to eat or drink by mouth but he is tolerating ice chips. Reports tolerating 6 cartons of Osmolite 1.5 daily. Noted labs: Sodium 130, potassium 3.9, glucose 122, BUN 24, creatinine 0.81 and albumin 2.8 . Estimated nutrition needs: 1800-2000 cal, 62-82 g protein, 2 L fluid.  6 cartons of Osmolite 1.5 provides 2130 cal, 89.4 g protein, 1086 mL free water.  This provides 100% estimated nutrition needs.  Nutrition diagnosis: Unintentional weight loss improved.  Intervention: Patient was educated to continue Osmolite 1.5 - 6 cartons daily. Continue 60 mL free water before and 120 mL of free water after each bolus feeding. Updated orders written.  [facility] notified.  Monitoring, evaluation, goals: Patient will tolerate adequate calories and protein to minimize weight loss.  Next visit: Follow-up on October 12 during infusion with Joli.  **Disclaimer: This note was dictated with voice recognition software. Similar sounding words can inadvertently be transcribed and this note may contain transcription errors which may not have been corrected upon publication of note.**

## 2020-09-02 NOTE — Progress Notes (Signed)
Patient will not receive cisplatin today 10/5 but will instead receive NS 1L over 2 hours per dr. Alen Blew.

## 2020-09-03 ENCOUNTER — Encounter: Payer: Self-pay | Admitting: Radiation Oncology

## 2020-09-03 ENCOUNTER — Other Ambulatory Visit: Payer: Self-pay

## 2020-09-03 ENCOUNTER — Ambulatory Visit
Admission: RE | Admit: 2020-09-03 | Discharge: 2020-09-03 | Disposition: A | Discharge status: Home / Self Care | Payer: Medicare HMO | Source: Ambulatory Visit | Attending: Radiation Oncology | Admitting: Radiation Oncology

## 2020-09-03 DIAGNOSIS — C111 Malignant neoplasm of posterior wall of nasopharynx: Secondary | ICD-10-CM | POA: Diagnosis not present

## 2020-09-03 NOTE — Progress Notes (Signed)
I communicated with Dr. Redmond Baseman after tumor board today to inform him of the substantial regression/ necrosis of the patient's tumor in response to treatment (per our cone beam CT imaging in rad/onc) as well as some abnormal findings in his ipsilateral right tympanic membrane.  Dr. Redmond Baseman is graciously working the patient into his schedule to evaluate.  -----------------------------------  Eppie Gibson, MD

## 2020-09-04 ENCOUNTER — Ambulatory Visit
Admission: RE | Admit: 2020-09-04 | Discharge: 2020-09-04 | Disposition: A | Discharge status: Home / Self Care | Payer: Medicare HMO | Source: Ambulatory Visit | Attending: Radiation Oncology | Admitting: Radiation Oncology

## 2020-09-04 ENCOUNTER — Inpatient Hospital Stay: Payer: Medicare HMO

## 2020-09-04 ENCOUNTER — Telehealth: Payer: Self-pay | Admitting: Oncology

## 2020-09-04 ENCOUNTER — Inpatient Hospital Stay: Payer: Medicare HMO | Admitting: General Practice

## 2020-09-04 VITALS — BP 102/69 | HR 96 | Temp 98.3°F | Resp 18

## 2020-09-04 DIAGNOSIS — C111 Malignant neoplasm of posterior wall of nasopharynx: Secondary | ICD-10-CM | POA: Diagnosis not present

## 2020-09-04 DIAGNOSIS — C119 Malignant neoplasm of nasopharynx, unspecified: Secondary | ICD-10-CM | POA: Diagnosis not present

## 2020-09-04 MED ORDER — SODIUM CHLORIDE 0.9% FLUSH
10.0000 mL | Freq: Once | INTRAVENOUS | Status: AC | PRN
  Administered 2020-09-04: 10 mL
  Filled 2020-09-04: qty 10

## 2020-09-04 MED ORDER — HEPARIN SOD (PORK) LOCK FLUSH 100 UNIT/ML IV SOLN
500.0000 [IU] | Freq: Once | INTRAVENOUS | Status: AC | PRN
  Administered 2020-09-04: 500 [IU]
  Filled 2020-09-04: qty 5

## 2020-09-04 MED ORDER — SODIUM CHLORIDE 0.9 % IV SOLN
Freq: Once | INTRAVENOUS | Status: AC
  Filled 2020-09-04: qty 250

## 2020-09-04 NOTE — Telephone Encounter (Signed)
Scheduled per los, called and spoke with patient's relative. Patient will be notified of upcoming appointments.

## 2020-09-04 NOTE — Patient Instructions (Signed)

## 2020-09-05 ENCOUNTER — Other Ambulatory Visit: Payer: Self-pay

## 2020-09-05 ENCOUNTER — Ambulatory Visit
Admission: RE | Admit: 2020-09-05 | Discharge: 2020-09-05 | Disposition: A | Discharge status: Home / Self Care | Payer: Medicare HMO | Source: Ambulatory Visit | Attending: Radiation Oncology | Admitting: Radiation Oncology

## 2020-09-05 ENCOUNTER — Telehealth: Payer: Self-pay

## 2020-09-05 DIAGNOSIS — C111 Malignant neoplasm of posterior wall of nasopharynx: Secondary | ICD-10-CM | POA: Diagnosis not present

## 2020-09-05 MED FILL — ONDANSETRON 4 MG/5 ML SOLN: 4 | 20 days supply | Qty: 450 | Fill #1

## 2020-09-05 NOTE — Telephone Encounter (Signed)
-----   Message from Wyatt Portela, MD sent at 09/05/2020 10:55 AM EDT ----- I would recommend refilling Zofran as I anticipate some nausea still.  She can have a half a prescription filled if she desires.  Pain medication will be tapered in the future.  I will address this on Tuesday. Thanks ----- Message ----- From: Kennedy Bucker, LPN Sent: [id number]/11/8286  10:42 AM EDT To: Wyatt Portela, MD  Patient's sister in law called with questions. She knows he has an appointment on Tuesday but had questions today. She wanted to know if his nausea should improve now that he is not having chemo. And should she fill his whole Zofran prescription or only get it part of his prescription  filled. Should his pain meds start being tapered since he is not having as much pain. States his ear pain has improved since he has been on ABT. And how long will his feeding tube need to stay in. Please advise. Thank you,  Maudie Mercury LPN

## 2020-09-05 NOTE — Telephone Encounter (Signed)
Returned phone call with answers to question per Dr Alen Blew. Sister in law Allison Park verbalized understanding.

## 2020-09-08 ENCOUNTER — Other Ambulatory Visit: Payer: Self-pay | Admitting: Medical

## 2020-09-08 ENCOUNTER — Other Ambulatory Visit: Payer: Self-pay | Admitting: Oncology

## 2020-09-08 ENCOUNTER — Ambulatory Visit
Admission: RE | Admit: 2020-09-08 | Discharge: 2020-09-08 | Disposition: A | Discharge status: Home / Self Care | Payer: Medicare HMO | Source: Ambulatory Visit | Attending: Radiation Oncology | Admitting: Radiation Oncology

## 2020-09-08 ENCOUNTER — Other Ambulatory Visit: Payer: Self-pay

## 2020-09-08 DIAGNOSIS — G893 Neoplasm related pain (acute) (chronic): Secondary | ICD-10-CM

## 2020-09-08 DIAGNOSIS — C111 Malignant neoplasm of posterior wall of nasopharynx: Secondary | ICD-10-CM | POA: Diagnosis not present

## 2020-09-08 MED ORDER — OXYCODONE HCL 5 MG/5ML PO SOLN
5.0000 mg | ORAL | 0 refills | Status: DC | PRN
Start: 2020-09-08 — End: 2020-09-22

## 2020-09-08 MED FILL — oxyCODONE HCL 5 MG/5ML SOLN: 5 | 8 days supply | Qty: 240 | Fill #0

## 2020-09-09 ENCOUNTER — Inpatient Hospital Stay: Payer: Medicare HMO

## 2020-09-09 ENCOUNTER — Ambulatory Visit
Admission: RE | Admit: 2020-09-09 | Discharge: 2020-09-09 | Disposition: A | Discharge status: Home / Self Care | Payer: Medicare HMO | Source: Ambulatory Visit | Attending: Radiation Oncology | Admitting: Radiation Oncology

## 2020-09-09 ENCOUNTER — Inpatient Hospital Stay (HOSPITAL_BASED_OUTPATIENT_CLINIC_OR_DEPARTMENT_OTHER): Payer: Medicare HMO | Admitting: Oncology

## 2020-09-09 ENCOUNTER — Other Ambulatory Visit (HOSPITAL_COMMUNITY): Payer: Self-pay | Admitting: Otolaryngology

## 2020-09-09 ENCOUNTER — Other Ambulatory Visit: Payer: Self-pay

## 2020-09-09 VITALS — BP 138/79 | HR 80 | Temp 97.8°F | Resp 20 | Ht 65.0 in | Wt 113.1 lb

## 2020-09-09 VITALS — BP 154/78 | HR 78

## 2020-09-09 DIAGNOSIS — C119 Malignant neoplasm of nasopharynx, unspecified: Secondary | ICD-10-CM

## 2020-09-09 DIAGNOSIS — R112 Nausea with vomiting, unspecified: Secondary | ICD-10-CM

## 2020-09-09 DIAGNOSIS — C111 Malignant neoplasm of posterior wall of nasopharynx: Secondary | ICD-10-CM | POA: Diagnosis not present

## 2020-09-09 DIAGNOSIS — G893 Neoplasm related pain (acute) (chronic): Secondary | ICD-10-CM

## 2020-09-09 LAB — CBC WITH DIFFERENTIAL (CANCER CENTER ONLY)
Abs Immature Granulocytes: 0.07 10*3/uL (ref 0.00–0.07)
Basophils Absolute: 0 10*3/uL (ref 0.0–0.1)
Basophils Relative: 0 %
Eosinophils Absolute: 0 10*3/uL (ref 0.0–0.5)
Eosinophils Relative: 1 %
HCT: 29.1 % — ABNORMAL LOW (ref 39.0–52.0)
Hemoglobin: 9.7 g/dL — ABNORMAL LOW (ref 13.0–17.0)
Immature Granulocytes: 2 %
Lymphocytes Relative: 8 %
Lymphs Abs: 0.3 10*3/uL — ABNORMAL LOW (ref 0.7–4.0)
MCH: 31.6 pg (ref 26.0–34.0)
MCHC: 33.3 g/dL (ref 30.0–36.0)
MCV: 94.8 fL (ref 80.0–100.0)
Monocytes Absolute: 0.5 10*3/uL (ref 0.1–1.0)
Monocytes Relative: 14 %
Neutro Abs: 2.4 10*3/uL (ref 1.7–7.7)
Neutrophils Relative %: 75 %
Platelet Count: 432 10*3/uL — ABNORMAL HIGH (ref 150–400)
RBC: 3.07 MIL/uL — ABNORMAL LOW (ref 4.22–5.81)
RDW: 15.3 % (ref 11.5–15.5)
WBC Count: 3.2 10*3/uL — ABNORMAL LOW (ref 4.0–10.5)
nRBC: 0 % (ref 0.0–0.2)

## 2020-09-09 LAB — CMP (CANCER CENTER ONLY)
ALT: 22 U/L (ref 0–44)
AST: 22 U/L (ref 15–41)
Albumin: 2.5 g/dL — ABNORMAL LOW (ref 3.5–5.0)
Alkaline Phosphatase: 88 U/L (ref 38–126)
Anion gap: 4 — ABNORMAL LOW (ref 5–15)
BUN: 14 mg/dL (ref 8–23)
CO2: 33 mmol/L — ABNORMAL HIGH (ref 22–32)
Calcium: 8.7 mg/dL — ABNORMAL LOW (ref 8.9–10.3)
Chloride: 95 mmol/L — ABNORMAL LOW (ref 98–111)
Creatinine: 0.7 mg/dL (ref 0.61–1.24)
GFR, Estimated: >60 mL/min (ref >60)
Glucose, Bld: 95 mg/dL (ref 70–99)
Potassium: 4.5 mmol/L (ref 3.5–5.1)
Sodium: 132 mmol/L — ABNORMAL LOW (ref 135–145)
Total Bilirubin: 0.3 mg/dL (ref 0.3–1.2)
Total Protein: 5.9 g/dL — ABNORMAL LOW (ref 6.5–8.1)

## 2020-09-09 MED ORDER — HEPARIN SOD (PORK) LOCK FLUSH 100 UNIT/ML IV SOLN
500.0000 [IU] | Freq: Once | INTRAVENOUS | Status: AC | PRN
  Administered 2020-09-09: 500 [IU]
  Filled 2020-09-09: qty 5

## 2020-09-09 MED ORDER — SODIUM CHLORIDE 0.9% FLUSH
10.0000 mL | Freq: Once | INTRAVENOUS | Status: AC | PRN
  Administered 2020-09-09: 10 mL
  Filled 2020-09-09: qty 10

## 2020-09-09 MED ORDER — SODIUM CHLORIDE 0.9 % IV SOLN
Freq: Once | INTRAVENOUS | Status: AC
  Filled 2020-09-09: qty 250

## 2020-09-09 MED FILL — DEXAMETHASONE 0.1% EYE DROP: 0.1 | 25 days supply | Qty: 5 | Fill #0

## 2020-09-09 MED FILL — CIPROFLOXACIN 0.3% EYE DRO: 0.3 | 16 days supply | Qty: 5 | Fill #0

## 2020-09-09 NOTE — Progress Notes (Signed)
Hematology and Oncology Follow Up Visit  JAK HAGGAR [id number] [birth year] 69 y.o. 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  He presented with stage III tumor on the right.      Prior Therapy:  He is status post nasopharyngeal biopsy and right myringotomy completed by Dr. Redmond Baseman on June 04, 2020.   Current therapy:  Radiation therapy with weekly cisplatin chemotherapy started on July 30, 2020.  He is currently receiving radiation therapy alone after completing 4 cycles of cisplatin.  Interim History: Mr. Kunesh is here for repeat evaluation.  Since last visit, he reports no major changes in his health.  He continues to tolerate therapy without any major complaints.  Continues to have chronic ear and neck pain which is manageable with the current Duragesic patch and oxycodone.  He denies any recent vomiting or abdominal pain.  He denies any recent hospitalization or illnesses. .    Medications: Reviewed without changes. Current Outpatient Medications  Medication Sig Dispense Refill  . amoxicillin-clavulanate (AUGMENTIN) 125-31.25 MG/5ML suspension Take 5 mLs (125 mg total) by mouth 3 (three) times daily. 150 mL 0  . atorvastatin (LIPITOR) 20 MG tablet Take 20 mg by mouth at bedtime.  (Patient not taking: Reported on 08/05/2020)    . dexamethasone (DECADRON) 0.5 MG/5ML solution Take 64mL BID by PEG (with food). On 9/14, taper to 63mL daily with food. On 9/21, taper to 42mL daily with food.  On 9/28, taper to 71mL daily with food. Stop on 10/9. 500 mL 2  . fentaNYL (DURAGESIC) 25 MCG/HR Place 1 patch onto the skin every 3 (three) days. Use with 50 microgram patch for total dose of 75 micrograms. Replace patch every 3 days. 10 patch 0  . fentaNYL (DURAGESIC) 50 MCG/HR APPLY 1 PATCH ONTO SKIN EVERY 3 DAYS 10 patch 0  . gabapentin (NEURONTIN) 300 MG capsule Take 2  capsules in AM, 3 capsules in evening (Patient not taking: Reported on 08/05/2020) 450 capsule 3  . lansoprazole (PREVACID SOLUTAB) 15 MG disintegrating tablet Dissolve one tablet in mouth daily.  (Affordable alternative to previous liquid Rx for Omeprazole). 30 tablet 1  . lidocaine (XYLOCAINE) 2 % solution Patient: Mix 1part 2% viscous lidocaine, 1part H20. Swish & swallow 15mL of diluted mixture, 101min before meals and at bedtime, up to QID 200 mL 4  . lidocaine-prilocaine (EMLA) cream Apply 1 application topically as needed. (Patient not taking: Reported on 08/05/2020) 30 g 0  . metoCLOPramide (REGLAN) 5 MG/5ML solution Place 5 mLs (5 mg total) into feeding tube 3 (three) times daily before meals. 240 mL 2  . nitroGLYCERIN (NITROSTAT) 0.4 MG SL tablet Place 1 tablet (0.4 mg total) under the tongue every 5 (five) minutes as needed. Place 1 tablet under tongue for chest pain -- not to exceed 3 in 15 minutes (Patient not taking: Reported on 07/31/2020) 100 tablet 11  . nortriptyline (PAMELOR) 10 MG capsule Take 1 capsule every night for 1 week, then increase to 2 capsules every night (Patient not taking: Reported on 08/05/2020) 60 capsule 11  . Nutritional Supplements (FEEDING SUPPLEMENT, OSMOLITE 1.5 CAL,) LIQD Give 1-1/2 cartons Osmolite 1.5 via PEG 4 times daily with 60 mL free water before and 120 mL free water after each bolus feeding.  This provides 2130 cal, 89.4 g protein, 1806 mL free water/100% estimated nutrition needs.  Please send formula and supplies. 1422 mL 6  .  ondansetron (ZOFRAN ODT) 4 MG disintegrating tablet Take 1 tablet (4 mg total) by mouth every 8 (eight) hours as needed for nausea or vomiting. (Patient not taking: Reported on 08/05/2020) 20 tablet 0  . ondansetron (ZOFRAN) 4 MG/5ML solution Take 7.5 mLs (6 mg total) by mouth every 8 (eight) hours as needed for nausea or vomiting. Okay to take by tube, if preferred. Take around the clock, 3 x's daily, if nausea is severe. 473 mL 5  .  oxyCODONE (ROXICODONE) 5 MG/5ML solution Place 5 mLs (5 mg total) into feeding tube every 4 (four) hours as needed for severe pain. 240 mL 0  . prochlorperazine (COMPAZINE) 10 MG tablet Take 1 tablet (10 mg total) by mouth every 6 (six) hours as needed for nausea or vomiting. (Patient not taking: Reported on 07/31/2020) 30 tablet 0  . promethazine (PHENERGAN) 6.25 MG/5ML syrup 6.25 to 12.5 mg Q 6 hours VT prn nausea 473 mL 2  . rOPINIRole (REQUIP) 0.25 MG tablet TAKE 1 TABLET BY MOUTH 2 HOURS BEFORE BEDTIME (Patient not taking: Reported on 08/05/2020) 90 tablet 3  . tiZANidine (ZANAFLEX) 4 MG tablet Take 1 tablet (4 mg total) by mouth at bedtime. (Patient not taking: Reported on 08/05/2020) 30 tablet 11  . traMADol (ULTRAM) 50 MG tablet TAKE 1 TABLET BY MOUTH EVERY NIGHT (Patient not taking: Reported on 08/05/2020) 90 tablet 3   No current facility-administered medications for this visit.   Facility-Administered Medications Ordered in Other Visits  Medication Dose Route Frequency Provider Last Rate Last Admin  . heparin lock flush 100 unit/mL  250 Units Intracatheter Once PRN Wyatt Portela, MD      . sodium chloride flush (NS) 0.9 % injection 10 mL  10 mL Intracatheter Once PRN Wyatt Portela, MD      . sodium chloride flush (NS) 0.9 % injection 10 mL  10 mL Intracatheter PRN Wyatt Portela, MD   10 mL at 07/30/20 1520     Allergies: No Known Allergies   Physical Exam:   Blood pressure 138/79, pulse 80, temperature 97.8 F (36.6 C), temperature source Tympanic, resp. rate 20, height 5\' 5"  (1.651 m), weight 113 lb 1.6 oz (51.3 kg), SpO2 97 %.     ECOG: 1     General appearance: Alert, awake without any distress. Head: Atraumatic without abnormalities Oropharynx: Without any thrush or ulcers. Eyes: No scleral icterus. Lymph nodes: No lymphadenopathy noted in the cervical, supraclavicular, or axillary nodes Heart:regular rate and rhythm, without any murmurs or gallops.   Lung: Clear  to auscultation without any rhonchi, wheezes or dullness to percussion. Abdomin: Soft, nontender without any shifting dullness or ascites. Musculoskeletal: No clubbing or cyanosis. Neurological: No motor or sensory deficits. Skin: No rashes or lesions.       Lab Results: Lab Results  Component Value Date   WBC 1.9 (L) 09/01/2020   HGB 10.4 (L) 09/01/2020   HCT 30.7 (L) 09/01/2020   MCV 93.3 09/01/2020   PLT 185 09/01/2020     Chemistry      Component Value Date/Time   NA 130 (L) 09/01/2020 0851   K 3.9 09/01/2020 0851   CL 89 (L) 09/01/2020 0851   CO2 33 (H) 09/01/2020 0851   BUN 24 (H) 09/01/2020 0851   CREATININE 0.81 09/01/2020 0851   CREATININE 1.00 06/27/2015 1222      Component Value Date/Time   CALCIUM 8.9 09/01/2020 0851   ALKPHOS 91 09/01/2020 0851   AST 25 09/01/2020  0851   ALT 31 09/01/2020 0851   BILITOT 0.7 09/01/2020 0851         Impression and Plan:  1.    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    Presented with stage III disease.  He continues to receive definitive therapy with radiation and completed 4 cycles of concomitant cisplatin therapy.  Cisplatin has been withheld because of failure to thrive pancytopenia.  Risks and benefits of resuming cisplatin were discussed at this time.  I recommended continuing radiation therapy alone given his overall health status.  We will continue supportive management with IV hydration till he is recovered.  He is agreeable with this plan.  2.  IV access: Port-A-Cath will continue to be in use for IV hydration.  3.  Nutrition: He is receiving it via feeding tube which will continue for the time being.  4.  Antiemetics: Compazine is available to him without any residual nausea or vomiting.  5.  Renal function surveillance: Kidney function remained stable at this time.  6.  Pain: His pain is manageable currently on fentanyl patch and liquid oxycodone.   7.    Hyponatremia:  Sodium is correcting currently with improved hydration.  8.  Follow-up: He will continue to have weekly labs and hydration appointments 2-3 times a week.  30  minutes were dedicated to this visit.  The time was spent on reviewing laboratory data, discussing treatment options and future plan of care review.   Zola Button, MD 10/12/20217:47 AM

## 2020-09-09 NOTE — Patient Instructions (Signed)

## 2020-09-09 NOTE — Patient Instructions (Signed)

## 2020-09-09 NOTE — Progress Notes (Signed)
Nutrition Follow-up:  Patient with nasopharyngeal cancer.  Patient no longer receiving cisplatin but continuing with radiation.   Met with patient during IV fluids.  Patient reports that he is taking 5-6 cartons of osmolite 1.5 daily via feeding tube.  Reports having diarrhea last night and this am after taking ensure chocolate in feeding tube.  Reports that he will not do that again but will use osmolite. Took imodium this am.     Medications: reviewed  Labs: Na 132, BUN 14, creatinine 0.70  Anthropometrics:   Weight 113 lb 1.6 oz today decreased from 116 lb   Estimated Energy Needs  Kcals: 1800-2000 Protein: 62-82 g Fluid: 2 L  NUTRITION DIAGNOSIS: Unintentional weight loss continues   INTERVENTION:  Continue osmolite 1.5, ideally 6 cartons per day with weight loss.  Patient verbalized understanding.  Flush with 58m of water before and 1230mwater after.   Patient given number to [facility] call and reorder tube feeding.     MONITORING, EVALUATION, GOAL: weight trends, intake, tube feeding   NEXT VISIT: Tuesday, October 19 during fluids  Onesimo Lingard B. AlZenia ResidesRDTuolumne CityLDGratonegistered Dietitian [phone])

## 2020-09-10 ENCOUNTER — Ambulatory Visit
Admission: RE | Admit: 2020-09-10 | Discharge: 2020-09-10 | Disposition: A | Discharge status: Home / Self Care | Payer: Medicare HMO | Source: Ambulatory Visit | Attending: Radiation Oncology | Admitting: Radiation Oncology

## 2020-09-10 ENCOUNTER — Encounter: Payer: Self-pay | Admitting: General Practice

## 2020-09-10 DIAGNOSIS — C111 Malignant neoplasm of posterior wall of nasopharynx: Secondary | ICD-10-CM | POA: Diagnosis not present

## 2020-09-10 NOTE — Progress Notes (Signed)
[place] CSW Progress Notes  Call to sister in law Powers. Patient expressing difficulty affording medications (specifically Osmolite and pain medications) and specialist copays.  Per sister in law, patient is in process of applying for extra help Medicaid w DSS, she will check w family friend assisting patient.  Asked dietitian Oliva Bustard about any help w Osmolite.  Family understands that Medicare copays and visit charges are required under Medicare program, but DSS Medicaid may be able to help if patient qualifies.  CSW will also enroll in [facility] for their help at his visit tiomorrow.  Edwyna Shell, LCSW Clinical Social Worker Phone:  [phone]

## 2020-09-10 NOTE — Telephone Encounter (Signed)
Dr. Alen Blew, Please refuse as you have already refilled this. Gardiner Rhyme, RN

## 2020-09-11 ENCOUNTER — Other Ambulatory Visit: Payer: Self-pay

## 2020-09-11 ENCOUNTER — Inpatient Hospital Stay: Payer: Medicare HMO

## 2020-09-11 ENCOUNTER — Ambulatory Visit
Admission: RE | Admit: 2020-09-11 | Discharge: 2020-09-11 | Disposition: A | Discharge status: Home / Self Care | Payer: Medicare HMO | Source: Ambulatory Visit | Attending: Radiation Oncology | Admitting: Radiation Oncology

## 2020-09-11 VITALS — BP 112/75 | HR 97 | Temp 98.2°F | Resp 20

## 2020-09-11 DIAGNOSIS — C119 Malignant neoplasm of nasopharynx, unspecified: Secondary | ICD-10-CM | POA: Diagnosis not present

## 2020-09-11 DIAGNOSIS — C111 Malignant neoplasm of posterior wall of nasopharynx: Secondary | ICD-10-CM | POA: Diagnosis not present

## 2020-09-11 MED ORDER — SODIUM CHLORIDE 0.9% FLUSH
10.0000 mL | Freq: Once | INTRAVENOUS | Status: AC | PRN
  Administered 2020-09-11: 10 mL
  Filled 2020-09-11: qty 10

## 2020-09-11 MED ORDER — SODIUM CHLORIDE 0.9 % IV SOLN
Freq: Once | INTRAVENOUS | Status: AC
  Filled 2020-09-11: qty 250

## 2020-09-11 MED ORDER — MORPHINE SULFATE (PF) 2 MG/ML IV SOLN: 2.0000 mg | INTRAVENOUS | Status: DC | PRN

## 2020-09-11 MED ORDER — HEPARIN SOD (PORK) LOCK FLUSH 100 UNIT/ML IV SOLN
500.0000 [IU] | Freq: Once | INTRAVENOUS | Status: AC | PRN
  Administered 2020-09-11: 500 [IU]
  Filled 2020-09-11: qty 5

## 2020-09-11 NOTE — Patient Instructions (Signed)

## 2020-09-12 ENCOUNTER — Other Ambulatory Visit: Payer: Self-pay

## 2020-09-12 ENCOUNTER — Other Ambulatory Visit: Payer: Self-pay | Admitting: Radiation Therapy

## 2020-09-12 ENCOUNTER — Telehealth: Payer: Self-pay | Admitting: Oncology

## 2020-09-12 ENCOUNTER — Ambulatory Visit
Admission: RE | Admit: 2020-09-12 | Discharge: 2020-09-12 | Disposition: A | Discharge status: Home / Self Care | Payer: Medicare HMO | Source: Ambulatory Visit | Attending: Radiation Oncology | Admitting: Radiation Oncology

## 2020-09-12 ENCOUNTER — Inpatient Hospital Stay: Payer: Medicare HMO

## 2020-09-12 VITALS — BP 147/71 | HR 84 | Temp 98.3°F | Resp 18

## 2020-09-12 DIAGNOSIS — C119 Malignant neoplasm of nasopharynx, unspecified: Secondary | ICD-10-CM | POA: Diagnosis not present

## 2020-09-12 DIAGNOSIS — C111 Malignant neoplasm of posterior wall of nasopharynx: Secondary | ICD-10-CM | POA: Diagnosis not present

## 2020-09-12 MED ORDER — HEPARIN SOD (PORK) LOCK FLUSH 100 UNIT/ML IV SOLN
500.0000 [IU] | Freq: Once | INTRAVENOUS | Status: AC | PRN
  Administered 2020-09-12: 500 [IU]
  Filled 2020-09-12: qty 5

## 2020-09-12 MED ORDER — SODIUM CHLORIDE 0.9% FLUSH
10.0000 mL | Freq: Once | INTRAVENOUS | Status: AC | PRN
  Administered 2020-09-12: 10 mL
  Filled 2020-09-12: qty 10

## 2020-09-12 MED ORDER — SODIUM CHLORIDE 0.9 % IV SOLN
Freq: Once | INTRAVENOUS | Status: AC
  Filled 2020-09-12: qty 250

## 2020-09-12 MED FILL — METOCLOPRAMIDE 5 MG/5 ML SY: 10 | 16 days supply | Qty: 240 | Fill #2

## 2020-09-12 NOTE — Patient Instructions (Signed)

## 2020-09-12 NOTE — Telephone Encounter (Signed)
Scheduled per los, patient has been called and voicemail was left regarding upcoming appointments.  

## 2020-09-15 ENCOUNTER — Other Ambulatory Visit: Payer: Self-pay | Admitting: Radiation Oncology

## 2020-09-15 ENCOUNTER — Other Ambulatory Visit: Payer: Self-pay | Admitting: Radiation Therapy

## 2020-09-15 ENCOUNTER — Ambulatory Visit
Admission: RE | Admit: 2020-09-15 | Discharge: 2020-09-15 | Disposition: A | Discharge status: Home / Self Care | Payer: Medicare HMO | Source: Ambulatory Visit | Attending: Radiation Oncology | Admitting: Radiation Oncology

## 2020-09-15 ENCOUNTER — Encounter: Payer: Self-pay | Admitting: Radiation Oncology

## 2020-09-15 ENCOUNTER — Other Ambulatory Visit: Payer: Self-pay

## 2020-09-15 ENCOUNTER — Encounter: Payer: Self-pay | Admitting: Radiation Therapy

## 2020-09-15 DIAGNOSIS — C119 Malignant neoplasm of nasopharynx, unspecified: Secondary | ICD-10-CM

## 2020-09-15 DIAGNOSIS — C111 Malignant neoplasm of posterior wall of nasopharynx: Secondary | ICD-10-CM | POA: Diagnosis not present

## 2020-09-15 DIAGNOSIS — G2581 Restless legs syndrome: Secondary | ICD-10-CM

## 2020-09-15 DIAGNOSIS — G43109 Migraine with aura, not intractable, without status migrainosus: Secondary | ICD-10-CM

## 2020-09-15 MED ORDER — SONAFINE EX EMUL
1.0000 "application " | Freq: Two times a day (BID) | CUTANEOUS | Status: DC
  Administered 2020-09-15: 1 via TOPICAL

## 2020-09-15 MED ORDER — GABAPENTIN 250 MG/5ML PO SOLN: 300.0000 mg | Freq: Three times a day (TID) | ORAL | 3 refills | Status: DC

## 2020-09-15 MED FILL — PROMETHAZINE 6.25 MG/5 ML S: 6.25 | 11 days supply | Qty: 473 | Fill #1

## 2020-09-15 MED FILL — GABAPENTIN 250 MG/5ML SOLN: 250 | 26 days supply | Qty: 470 | Fill #0

## 2020-09-15 NOTE — Progress Notes (Signed)
Sent referral to Dr. Davy Pique at [facility] for pain Management. Documentation sent to [facility].   Mont Dutton R.T.(R)(T) Radiation Special Procedures Navigator

## 2020-09-15 NOTE — Progress Notes (Signed)
[name] was presented this morning at brain tumor conference.  He has an advanced nasopharyngeal cancer with base of skull invasion and severe persistent pain in the right occipital region and ear.  This has persisted through chemoradiation.  I suspect that even if he has a complete response to oncologic treatment, the bone damage caused by this tumor will put him at risk for chronic pain.  The consensus at tumor board was that he would benefit from a consultation with the pain specialist at the neurosurgical clinic.  The patient is enthusiastic about seeing the specialist as soon as possible.  At tumor board, the possibility of a nerve block was discussed and the neurosurgeons felt that this may potentially help the patient.  Certainly other interventions may be considered as well.  I am resuming his gabapentin (patient stopped this months ago and neurology) at 300 mg 3 times a day to see if this helps his pain.  He will continue narcotics under the care of medical oncology as well. -----------------------------------  Eppie Gibson, MD

## 2020-09-16 ENCOUNTER — Other Ambulatory Visit: Payer: Self-pay

## 2020-09-16 ENCOUNTER — Inpatient Hospital Stay: Payer: Medicare HMO

## 2020-09-16 ENCOUNTER — Ambulatory Visit: Payer: Medicare HMO

## 2020-09-16 ENCOUNTER — Ambulatory Visit
Admission: RE | Admit: 2020-09-16 | Discharge: 2020-09-16 | Disposition: A | Discharge status: Home / Self Care | Payer: Medicare HMO | Source: Ambulatory Visit | Attending: Radiation Oncology | Admitting: Radiation Oncology

## 2020-09-16 VITALS — BP 119/73 | HR 83 | Temp 98.8°F | Resp 17

## 2020-09-16 DIAGNOSIS — C111 Malignant neoplasm of posterior wall of nasopharynx: Secondary | ICD-10-CM | POA: Diagnosis not present

## 2020-09-16 DIAGNOSIS — C119 Malignant neoplasm of nasopharynx, unspecified: Secondary | ICD-10-CM | POA: Diagnosis not present

## 2020-09-16 LAB — CMP (CANCER CENTER ONLY)
ALT: 12 U/L (ref 0–44)
AST: 16 U/L (ref 15–41)
Albumin: 2.8 g/dL — ABNORMAL LOW (ref 3.5–5.0)
Alkaline Phosphatase: 105 U/L (ref 38–126)
Anion gap: 5 (ref 5–15)
BUN: 21 mg/dL (ref 8–23)
CO2: 31 mmol/L (ref 22–32)
Calcium: 8.9 mg/dL (ref 8.9–10.3)
Chloride: 96 mmol/L — ABNORMAL LOW (ref 98–111)
Creatinine: 0.78 mg/dL (ref 0.61–1.24)
GFR, Estimated: >60 mL/min (ref >60)
Glucose, Bld: 92 mg/dL (ref 70–99)
Potassium: 4.5 mmol/L (ref 3.5–5.1)
Sodium: 132 mmol/L — ABNORMAL LOW (ref 135–145)
Total Bilirubin: 0.2 mg/dL — ABNORMAL LOW (ref 0.3–1.2)
Total Protein: 6.1 g/dL — ABNORMAL LOW (ref 6.5–8.1)

## 2020-09-16 LAB — CBC WITH DIFFERENTIAL (CANCER CENTER ONLY)
Abs Immature Granulocytes: 0.09 10*3/uL — ABNORMAL HIGH (ref 0.00–0.07)
Basophils Absolute: 0 10*3/uL (ref 0.0–0.1)
Basophils Relative: 1 %
Eosinophils Absolute: 0 10*3/uL (ref 0.0–0.5)
Eosinophils Relative: 0 %
HCT: 30.7 % — ABNORMAL LOW (ref 39.0–52.0)
Hemoglobin: 10.2 g/dL — ABNORMAL LOW (ref 13.0–17.0)
Immature Granulocytes: 1 %
Lymphocytes Relative: 4 %
Lymphs Abs: 0.3 10*3/uL — ABNORMAL LOW (ref 0.7–4.0)
MCH: 31.7 pg (ref 26.0–34.0)
MCHC: 33.2 g/dL (ref 30.0–36.0)
MCV: 95.3 fL (ref 80.0–100.0)
Monocytes Absolute: 0.8 10*3/uL (ref 0.1–1.0)
Monocytes Relative: 10 %
Neutro Abs: 7 10*3/uL (ref 1.7–7.7)
Neutrophils Relative %: 84 %
Platelet Count: 431 10*3/uL — ABNORMAL HIGH (ref 150–400)
RBC: 3.22 MIL/uL — ABNORMAL LOW (ref 4.22–5.81)
RDW: 15.5 % (ref 11.5–15.5)
WBC Count: 8.2 10*3/uL (ref 4.0–10.5)
nRBC: 0 % (ref 0.0–0.2)

## 2020-09-16 MED ORDER — HEPARIN SOD (PORK) LOCK FLUSH 100 UNIT/ML IV SOLN
500.0000 [IU] | Freq: Once | INTRAVENOUS | Status: AC | PRN
  Administered 2020-09-16: 500 [IU]
  Filled 2020-09-16: qty 5

## 2020-09-16 MED ORDER — SODIUM CHLORIDE 0.9 % IV SOLN
Freq: Once | INTRAVENOUS | Status: AC
  Filled 2020-09-16: qty 250

## 2020-09-16 MED ORDER — SODIUM CHLORIDE 0.9% FLUSH
10.0000 mL | Freq: Once | INTRAVENOUS | Status: AC | PRN
  Administered 2020-09-16: 10 mL
  Filled 2020-09-16: qty 10

## 2020-09-16 MED ORDER — SODIUM CHLORIDE 0.9% FLUSH
3.0000 mL | Freq: Once | INTRAVENOUS | Status: AC | PRN
  Administered 2020-09-16: 3 mL
  Filled 2020-09-16: qty 10

## 2020-09-16 NOTE — Patient Instructions (Signed)

## 2020-09-16 NOTE — Patient Instructions (Signed)

## 2020-09-16 NOTE — Progress Notes (Signed)
Nutrition Follow-up:  Patient with nasopharyngeal cancer.  Patient no longer receiving cisplatin but continuing with radiation.  Last treatment planned for tomorrow 10/20.    Met with patient during IV fluids.  Patient reports that he has been taking 5-6 cartons of osmolite 1.5.  Is not giving ensure chocolate through tube any more.  Feels like caused diarrhea.  Reports no diarrhea for about 1 week now.  Reports pain in head is better after he is taking gabapentin.    Medications: reviewed  Labs: reviewed  Anthropometrics:   Weight 107 lb 4 oz per [facility] 10/18 113 lb 1.6 oz on 1012    Estimated Energy Needs  Kcals: 1800-2000 Protein: 62-82 g Fluid: 2 L  NUTRITION DIAGNOSIS: Unintentional weight loss continues   INTERVENTION:  Recommend patient consistently give 6 cartons of osmolite 1.5 daily via feeding tube.  Flush with 95m of water before and 1238mafter feeding Application from [place] to patient to complete to evaluate for assistance for help with paying for tube feeding supplies and formula.  Patient to give to sister in law GwZurich  Contact information provided.    MONITORING, EVALUATION, GOAL: weight trends, tube feeding   NEXT VISIT: Nov 2 phone f/u  Kirstin Kugler B. AlZenia ResidesRDTaltyLDZeelandegistered Dietitian [phone])

## 2020-09-17 ENCOUNTER — Encounter: Payer: Self-pay | Admitting: Radiation Oncology

## 2020-09-17 ENCOUNTER — Other Ambulatory Visit: Payer: Self-pay | Admitting: Oncology

## 2020-09-17 ENCOUNTER — Ambulatory Visit
Admission: RE | Admit: 2020-09-17 | Discharge: 2020-09-17 | Disposition: A | Discharge status: Home / Self Care | Payer: Medicare HMO | Source: Ambulatory Visit | Attending: Radiation Oncology | Admitting: Radiation Oncology

## 2020-09-17 DIAGNOSIS — C111 Malignant neoplasm of posterior wall of nasopharynx: Secondary | ICD-10-CM | POA: Diagnosis not present

## 2020-09-17 MED ORDER — LIDOCAINE-PRILOCAINE 2.5-2.5 % EX CREA: 1.0000 "application " | TOPICAL_CREAM | CUTANEOUS | 0 refills | Status: DC | PRN

## 2020-09-17 MED FILL — LIDOCAINE-PRILOCAINE CREAM: 2.5-2.5 | 30 days supply | Qty: 30 | Fill #0

## 2020-09-17 NOTE — Progress Notes (Signed)
Oncology Nurse Navigator Documentation  Met with Willie Kennedy after final RT to offer support and to celebrate end of radiation treatment.   Provided verbal/written post-RT guidance:  Importance of keeping all follow-up appts, especially those with Nutrition and SLP.  Importance of protecting treatment area from sun.  Continuation of Sonafine application 2-3 times daily, application of antibiotic ointment to areas of raw skin; when supply of Sonafine exhausted transition to OTC lotion with vitamin E. Provided/reviewed Epic calendar of upcoming appts. Explained my role as navigator will continue for several more months, encouraged him to call me with needs/concerns.    Harlow Asa RN, BSN, OCN Head & Neck Oncology Nurse [place] at [facility] Phone # [phone]  Fax # [phone]

## 2020-09-18 ENCOUNTER — Encounter: Payer: Self-pay | Admitting: General Practice

## 2020-09-18 ENCOUNTER — Telehealth: Payer: Self-pay

## 2020-09-18 ENCOUNTER — Inpatient Hospital Stay: Payer: Medicare HMO

## 2020-09-18 ENCOUNTER — Ambulatory Visit: Payer: Medicare HMO

## 2020-09-18 ENCOUNTER — Ambulatory Visit: Payer: Medicare HMO | Attending: Radiation Oncology

## 2020-09-18 ENCOUNTER — Other Ambulatory Visit: Payer: Self-pay

## 2020-09-18 ENCOUNTER — Inpatient Hospital Stay (HOSPITAL_BASED_OUTPATIENT_CLINIC_OR_DEPARTMENT_OTHER): Payer: Medicare HMO | Admitting: Medical

## 2020-09-18 DIAGNOSIS — M792 Neuralgia and neuritis, unspecified: Secondary | ICD-10-CM

## 2020-09-18 DIAGNOSIS — C119 Malignant neoplasm of nasopharynx, unspecified: Secondary | ICD-10-CM

## 2020-09-18 DIAGNOSIS — R1310 Dysphagia, unspecified: Secondary | ICD-10-CM | POA: Diagnosis present

## 2020-09-18 MED ORDER — LIDOCAINE-PRILOCAINE 2.5-2.5 % EX CREA: 1.0000 "application " | TOPICAL_CREAM | CUTANEOUS | 2 refills | Status: AC | PRN

## 2020-09-18 MED ORDER — HEPARIN SOD (PORK) LOCK FLUSH 100 UNIT/ML IV SOLN
500.0000 [IU] | Freq: Once | INTRAVENOUS | Status: AC | PRN
  Administered 2020-09-18: 500 [IU]
  Filled 2020-09-18: qty 5

## 2020-09-18 MED ORDER — SODIUM CHLORIDE 0.9% FLUSH
10.0000 mL | Freq: Once | INTRAVENOUS | Status: AC | PRN
  Administered 2020-09-18: 10 mL
  Filled 2020-09-18: qty 10

## 2020-09-18 MED ORDER — SODIUM CHLORIDE 0.9 % IV SOLN
Freq: Once | INTRAVENOUS | Status: AC
  Filled 2020-09-18: qty 250

## 2020-09-18 NOTE — Patient Instructions (Signed)

## 2020-09-18 NOTE — Patient Instructions (Signed)
°  Drink a few sips of water at least twice a day, or take a few ice chips at least twice a day.  Make sure your mouth is good and clean like we talked about today, before you try this.

## 2020-09-18 NOTE — Therapy (Signed)
[facility] [address] [place], [place], [address] Phone: [phone]   Fax:  [phone]  Speech Language Pathology Treatment  Patient Details  Name: Willie Kennedy MRN: [id number] Date of Birth: 07-06-1951 Referring Provider (SLP): Eppie Gibson, MD   Encounter Date: 09/18/2020   End of Session - 09/18/20 1351    Visit Number 2    Number of Visits 7    Date for SLP Re-Evaluation 11/12/20    Authorization Type [employer] - visits requested    SLP Start Time 1245    SLP Stop Time  1310    SLP Time Calculation (min) 25 min    Activity Tolerance Patient tolerated treatment well           Past Medical History:  Diagnosis Date  . Biceps tendon rupture    h/o Right biceps tendon rupture  . CAD (coronary artery disease)    a. ETT myoview (10/11) with EF 52%, reversible inferior perfusion defect, 1 mm inferolateral ST depression with exercise, 7"16" and stopped due to dyspnea and leg weakness -> LHC (10/11), EF 55%, 40% pRCA, 40% mRCA, 40% prox to mid CFX. medical treatment.  . Cancer ([facility])    skin cancer behind left ear   . Dizziness    with bending over   . Hard of hearing    more per right ear  . Headache    with bright light   . History of bronchitis   . History of hiatal hernia   . Hyperlipidemia   . Impaired glucose tolerance   . Numbness and tingling in left arm    if holds arm upward too long pt states his primary MD is aware  . Osteoarthritis   . Peripheral vascular disease ([facility] [facility]) 2005   a. s/p left fem-pop Dr.Lawson in 2005.  Marland Kitchen Shortness of breath dyspnea    walking distances or climbing stairs  . Tinnitus     Past Surgical History:  Procedure Laterality Date  . ds/p left femoral artery occlusion  2005   s/p left fem-pop bypass- Dr. Kellie Simmering  . HERNIA REPAIR    . IR GASTROSTOMY TUBE MOD SED  07/24/2020  . IR IMAGING GUIDED PORT INSERTION  07/24/2020  . MULTIPLE EXTRACTIONS WITH  ALVEOLOPLASTY N/A 07/17/2020   Procedure: Extraction of tooth #'s 8,41,66,06,TKZ 31 with alveoloplasty;  Surgeon: Lenn Cal, DDS;  Location: [facility];  Service: Oral Surgery;  Laterality: N/A;  . MYRINGOTOMY WITH TUBE PLACEMENT Right 06/26/2020   Procedure: MYRINGOTOMY WITH TUBE PLACEMENT RIGHT  EAR;  Surgeon: Melida Quitter, MD;  Location: [facility];  Service: ENT;  Laterality: Right;  . NASAL ENDOSCOPY N/A 06/26/2020   Procedure: NASAL ENDOSCOPY W/NASOPHARYNGEAL BIOPSY;  Surgeon: Melida Quitter, MD;  Location: [facility];  Service: ENT;  Laterality: N/A;  . PR VEIN BYPASS GRAFT,AORTO-FEM-POP    . s/p right bicep tendon rupture repair    . VENTRAL HERNIA REPAIR N/A 08/22/2015   Procedure: OPEN VENTRAL HERNIA REPAIR WITH MESH;  Surgeon: Jackolyn Confer, MD;  Location: [facility];  Service: General;  Laterality: N/A;    There were no vitals filed for this visit.   Subjective Assessment - 09/18/20 1320    Subjective "I've been doing the (chin tuck against resistance) a lot."    Currently in Pain? Yes    Pain Score 5     Pain Location Head    Pain Orientation Right    Pain Descriptors /  Indicators Aching;Headache    Pain Type Chronic pain    Pain Onset More than a month ago    Pain Frequency Constant                 ADULT SLP TREATMENT - 09/18/20 1334      General Information   Behavior/Cognition Alert;Cooperative      Treatment Provided   Treatment provided Dysphagia      Dysphagia Treatment   Temperature Spikes Noted No   overt s/sx pneumonia were denied by pt   Respiratory Status Room air    Treatment Methods Skilled observation;Therapeutic exercise;Patient/caregiver education    Patient observed directly with PO's No   pt did not perform oral care prior to appointment   Type of PO's observed Regular    Feeding Able to feed self    Other treatment/comments Pt admits to performing HEP suboptimal frequency and scope. SLP directed pt through  the entire HEP with total A faded to modified independent. "I will do this more," pt stated about the HEP. Pt is attempting water sips each day however he rarely reports a day when he does not cough. SLP inquired about ice chips as well, Willie Kennedy stated that he coughs with ice chips as well. SLP told pt to cont with water and/or ice chips at least BID but beforehand make sure he has performed careful oral care inclding brushing teeth, gums, tongue and roof of mouth. SLP explained to pt danger of muslce disuse atrophy and pt told SLP rationale for HEP with initial cues from SLP.       Assessment / Recommendations / Plan   Plan Continue with current plan of care      Progression Toward Goals   Progression toward goals Progressing toward goals            SLP Education - 09/18/20 1351    Education Details attempt PO each day, HEP procedure, rationale for HEP, muscle disuse atrophy    Person(s) Educated Patient;Caregiver(s)    Methods Explanation;Demonstration;Verbal cues;Handout    Comprehension Verbalized understanding;Returned demonstration;Verbal cues required;Need further instruction            SLP Short Term Goals - 09/18/20 1354      SLP SHORT TERM GOAL #1   Title pt will complete HEP with rare min A    Time 1    Period --   visits, for all STGs   Status On-going      SLP SHORT TERM GOAL #2   Title pt will tell SLP why pt is completing HEP with modified independence    Time 1    Status On-going      SLP SHORT TERM GOAL #3   Title pt will describe 3 overt s/s aspiration PNA with modified independence    Time 1    Status On-going      SLP SHORT TERM GOAL #4   Title pt will tell SLP how a food journal could hasten return to a more normalized diet    Time 2    Status On-going            SLP Long Term Goals - 09/18/20 1355      SLP LONG TERM GOAL #1   Title pt will complete HEP with modified independence over two visits    Time 3    Period --   visits, for all LTGs    Status On-going      SLP LONG TERM GOAL #2  Title pt will tell SLP why pt is completing HEP with modified independence over three visits    Time 3    Status On-going      SLP LONG TERM GOAL #3   Title pt will describe how to modify HEP over time, and the timeline associated with reduction in HEP frequency with modified independence over two sessions    Time 5    Status On-going            Plan - 09/18/20 1352    Clinical Impression Statement Pt presents today reporting coughing occurs regularly with 1-2 sips of water when pt attempts, occasionally during the week. SLP reviewed pt's individualized HEP for dysphagia and pt completed each exercise on their own with total min-mod cues faded to modified independent. There are no overt s/s aspiration PNA reported by pt at this time so SLP believes pt will cont to be safe with attempts at PO. Data indicate that pt's swallow ability will likely decrease over the course of radiation therapy and could very well decline over time following conclusion of their radiation therapy due to muscle disuse atrophy and/or muscle fibrosis. Pt will cont to need to be seen by SLP in order to assess safety of PO intake, assess the need for recommending any objective swallow assessment, and ensuring pt correctly completes the individualized HEP.    Speech Therapy Frequency --   once every approx 4 weeks   Duration --   total 7 visits   Treatment/Interventions Aspiration precaution training;Pharyngeal strengthening exercises;Diet toleration management by SLP;Trials of upgraded texture/liquids;Patient/family education;SLP instruction and feedback;Compensatory strategies;Internal/external aids    Potential to Achieve Goals Fair    Potential Considerations Cooperation/participation level   ? depression   SLP Home Exercise Plan provided today    Consulted and Agree with Plan of Care Patient           Patient will benefit from skilled therapeutic intervention in  order to improve the following deficits and impairments:   Dysphagia, unspecified type    Problem List Patient Active Problem List   Diagnosis Date Noted  . Carcinoma of posterior wall of nasopharynx ([facility]) 07/18/2020  . Nasopharyngeal cancer ([facility]) 07/08/2020  . Neuropathy 07/11/2019  . DJD (degenerative joint disease) 07/11/2019  . Decreased ROM of left elbow 10/03/2017  . Pain in left wrist 09/26/2017  . Biceps rupture, distal, left, initial encounter 06/27/2017  . Neuropathic pain 05/09/2017  . Complicated migraine 36/14/4315  . Restless leg syndrome 02/23/2017  . Transient alteration of awareness 08/06/2016  . Hyperreflexia 08/06/2016  . Muscle cramps 08/06/2016  . Left leg pain 02/06/2016  . Impacted ear wax 07/04/2015  . Hearing loss of both ears 07/04/2015  . Ventral hernia 05/16/2015  . Nocturnal leg cramps 05/16/2015  . Bruit 09/20/2014  . Chest pain 09/20/2014  . Aftercare following surgery of the circulatory system, [place] 08/07/2014  . Right elbow pain 01/10/2014  . Right leg pain 06/18/2013  . Goals of care, counseling/discussion 05/04/2013  . Impaired glucose tolerance 05/04/2013  . CAD, NATIVE VESSEL 10/01/2010  . Nonspecific abnormal electrocardiogram (ECG) (EKG) 09/04/2010  . Hyperlipidemia 08/27/2010  . OSTEOARTHRITIS, HANDS, BILATERAL 08/27/2010  . Atherosclerosis of native arteries of extremity with intermittent claudication ([facility]) 09/06/2008    [facility] ,[place], [place]  09/18/2020, 1:55 PM  [facility] [address] [place], [place], [address] Phone: [phone]   Fax:  [phone]   Name: Willie Kennedy MRN: [id number] Date of Birth:  04/19/1951 

## 2020-09-18 NOTE — Telephone Encounter (Signed)
Received VM from patient's sister-in-law [name] asking for a return call to discuss patient's medications.   Returned [employer] call and was updated that she and the patient feel his current Gabapentin prescription may be too strong. She reports that patient stumbled while trying to ambulate this past Tuesday, and that he told [name] he fell this morning when he got up from bed. She reports that patient has taken Gabapentin before, but has been off of if for about 3 months. She thinks the current dose may be too much for him to handle currently given his declined health status. [name] stated that today she held patient's morning dose of the medication, and only planned to give the patient 44mL (not full 12mL) for his afternoon dose; but she also wanted direction on future doses and if prescription could be modified. Informed her I would consult with either Dr. Sondra Come or Alfredia Client since Dr. Isidore Moos is out of the office today, and then call her back with an update. She verbalized understanding of plan and denied any other needs at the time.   Message sent to Van-PA with above information and to see if he had time to evaluate patient in infusion suite today while patient was receiving IVFs. Lucianne Lei stated he had time and would be able to see patient in infusion to assess and adjust prescription. High priority scheduling message sent to add patient to Van's schedule, and infusion nurse updated on plan. Provided nurse with [name]'s contact information if she has time to update [name] after Lucianne Lei sees patient, otherwise I will call [name] with new prescription instructions.   Will continue to provide support as needed

## 2020-09-18 NOTE — Progress Notes (Signed)
[facility] CSW Progress Notes  Provided second $63 disbursement for [employer] to patient in infusion - patient asleep, placed cards in his location and notified RN, also notified brother in law who was transporting him to/from treatment.  Edwyna Shell, LCSW Clinical Social Worker Phone:  [phone]

## 2020-09-19 ENCOUNTER — Ambulatory Visit: Payer: Medicare HMO

## 2020-09-22 ENCOUNTER — Telehealth: Payer: Self-pay | Admitting: *Deleted

## 2020-09-22 ENCOUNTER — Inpatient Hospital Stay: Payer: Medicare HMO

## 2020-09-22 ENCOUNTER — Other Ambulatory Visit: Payer: Medicare HMO

## 2020-09-22 ENCOUNTER — Ambulatory Visit: Payer: Medicare HMO | Admitting: Oncology

## 2020-09-22 ENCOUNTER — Other Ambulatory Visit: Payer: Self-pay | Admitting: Oncology

## 2020-09-22 ENCOUNTER — Other Ambulatory Visit: Payer: Self-pay

## 2020-09-22 VITALS — BP 142/77 | HR 71 | Temp 98.3°F | Resp 18 | Wt 111.0 lb

## 2020-09-22 DIAGNOSIS — G893 Neoplasm related pain (acute) (chronic): Secondary | ICD-10-CM

## 2020-09-22 DIAGNOSIS — C119 Malignant neoplasm of nasopharynx, unspecified: Secondary | ICD-10-CM

## 2020-09-22 LAB — CBC WITH DIFFERENTIAL (CANCER CENTER ONLY)
Abs Immature Granulocytes: 0.17 10*3/uL — ABNORMAL HIGH (ref 0.00–0.07)
Basophils Absolute: 0.1 10*3/uL (ref 0.0–0.1)
Basophils Relative: 1 %
Eosinophils Absolute: 0 10*3/uL (ref 0.0–0.5)
Eosinophils Relative: 0 %
HCT: 30.8 % — ABNORMAL LOW (ref 39.0–52.0)
Hemoglobin: 10.1 g/dL — ABNORMAL LOW (ref 13.0–17.0)
Immature Granulocytes: 2 %
Lymphocytes Relative: 2 %
Lymphs Abs: 0.2 10*3/uL — ABNORMAL LOW (ref 0.7–4.0)
MCH: 31.6 pg (ref 26.0–34.0)
MCHC: 32.8 g/dL (ref 30.0–36.0)
MCV: 96.3 fL (ref 80.0–100.0)
Monocytes Absolute: 1.3 10*3/uL — ABNORMAL HIGH (ref 0.1–1.0)
Monocytes Relative: 12 %
Neutro Abs: 9.5 10*3/uL — ABNORMAL HIGH (ref 1.7–7.7)
Neutrophils Relative %: 83 %
Platelet Count: 337 10*3/uL (ref 150–400)
RBC: 3.2 MIL/uL — ABNORMAL LOW (ref 4.22–5.81)
RDW: 15.3 % (ref 11.5–15.5)
WBC Count: 11.3 10*3/uL — ABNORMAL HIGH (ref 4.0–10.5)
nRBC: 0 % (ref 0.0–0.2)

## 2020-09-22 LAB — CMP (CANCER CENTER ONLY)
ALT: 11 U/L (ref 0–44)
AST: 16 U/L (ref 15–41)
Albumin: 3 g/dL — ABNORMAL LOW (ref 3.5–5.0)
Alkaline Phosphatase: 107 U/L (ref 38–126)
Anion gap: 7 (ref 5–15)
BUN: 22 mg/dL (ref 8–23)
CO2: 32 mmol/L (ref 22–32)
Calcium: 9.2 mg/dL (ref 8.9–10.3)
Chloride: 93 mmol/L — ABNORMAL LOW (ref 98–111)
Creatinine: 1.1 mg/dL (ref 0.61–1.24)
GFR, Estimated: >60 mL/min (ref >60)
Glucose, Bld: 107 mg/dL — ABNORMAL HIGH (ref 70–99)
Potassium: 5 mmol/L (ref 3.5–5.1)
Sodium: 132 mmol/L — ABNORMAL LOW (ref 135–145)
Total Bilirubin: 0.4 mg/dL (ref 0.3–1.2)
Total Protein: 6.4 g/dL — ABNORMAL LOW (ref 6.5–8.1)

## 2020-09-22 MED ORDER — SODIUM CHLORIDE 0.9% FLUSH
10.0000 mL | Freq: Once | INTRAVENOUS | Status: AC | PRN
  Administered 2020-09-22: 10 mL
  Filled 2020-09-22: qty 10

## 2020-09-22 MED ORDER — SODIUM CHLORIDE 0.9 % IV SOLN
Freq: Once | INTRAVENOUS | Status: AC
  Filled 2020-09-22: qty 250

## 2020-09-22 MED ORDER — HEPARIN SOD (PORK) LOCK FLUSH 100 UNIT/ML IV SOLN
500.0000 [IU] | Freq: Once | INTRAVENOUS | Status: AC | PRN
  Administered 2020-09-22: 500 [IU]
  Filled 2020-09-22: qty 5

## 2020-09-22 MED FILL — oxyCODONE HCL 5 MG/5ML SOLN: 5 | 8 days supply | Qty: 240 | Fill #0

## 2020-09-22 NOTE — Telephone Encounter (Signed)
Patients sister in law [place], called to request refill with increase in quantity of the Osmolite.  They state the last Rx was supposed to last him until November 1st but they feel they might run out sooner than that.  Routed to MD and RN

## 2020-09-22 NOTE — Patient Instructions (Signed)

## 2020-09-22 NOTE — Progress Notes (Signed)
The patient was seen in the infusion room today per the request of his providers nurse.  It was reported that the patient has had some falls at home.  He had been on gabapentin 300 mg via tube 3 times daily.  His wife decreased his dose of gabapentin to 250 mg via tube 3 times daily.  The patient was given instructions to take his family stating that they could decrease the dose to 200 to 250 mg 3 times daily.  They were told to not decrease this any further without first consulting our office.  The patient was also given a prescription for EMLA cream.  Sandi Mealy, MHS, PA-C Physician Assistant

## 2020-09-23 ENCOUNTER — Other Ambulatory Visit: Payer: Self-pay

## 2020-09-23 ENCOUNTER — Inpatient Hospital Stay (HOSPITAL_BASED_OUTPATIENT_CLINIC_OR_DEPARTMENT_OTHER): Payer: Medicare HMO | Admitting: Oncology

## 2020-09-23 VITALS — BP 104/65 | HR 96 | Temp 97.9°F | Resp 18 | Ht 65.0 in | Wt 110.2 lb

## 2020-09-23 DIAGNOSIS — C119 Malignant neoplasm of nasopharynx, unspecified: Secondary | ICD-10-CM | POA: Diagnosis not present

## 2020-09-23 NOTE — Progress Notes (Signed)
Hematology and Oncology Follow Up Visit  Willie Kennedy [id number] May 08, 1951 69 y.o. 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   Prior Therapy:  He is status post nasopharyngeal biopsy and right myringotomy completed by Dr. Redmond Baseman on June 04, 2020.  Radiation therapy with weekly cisplatin chemotherapy started on July 30, 2020.  He completed 4 cycles of cisplatin chemotherapy and completed radiation treatment in October 2021.   Current therapy: Supportive care only.    Interim History: [name] today for repeat evaluation.  Since the last visit, he reports a gradual improvement in his overall health without any recent complications.  He denies any nausea, vomiting or diarrhea.  He denies any abdominal pain.  He is getting nutrition predominantly via feeding tube and very little via mouth at this time.    Medications: Updated on review. Current Outpatient Medications  Medication Sig Dispense Refill  . amoxicillin-clavulanate (AUGMENTIN) 125-31.25 MG/5ML suspension Take 5 mLs (125 mg total) by mouth 3 (three) times daily. 150 mL 0  . atorvastatin (LIPITOR) 20 MG tablet Take 20 mg by mouth at bedtime.  (Patient not taking: Reported on 08/05/2020)    . dexamethasone (DECADRON) 0.5 MG/5ML solution Take 55mL BID by PEG (with food). On 9/14, taper to 33mL daily with food. On 9/21, taper to 52mL daily with food.  On 9/28, taper to 70mL daily with food. Stop on 10/9. 500 mL 2  . fentaNYL (DURAGESIC) 25 MCG/HR Place 1 patch onto the skin every 3 (three) days. Use with 50 microgram patch for total dose of 75 micrograms. Replace patch every 3 days. 10 patch 0  . fentaNYL (DURAGESIC) 50 MCG/HR APPLY 1 PATCH ONTO SKIN EVERY 3 DAYS 10 patch 0  . gabapentin (NEURONTIN) 250 MG/5ML solution Take 6 mLs (300 mg total) by mouth 3 (three) times daily. May take by PEG  tube. 470 mL 3  . lansoprazole (PREVACID SOLUTAB) 15 MG disintegrating tablet Dissolve one tablet in mouth daily.  (Affordable alternative to previous liquid Rx for Omeprazole). 30 tablet 1  . lidocaine (XYLOCAINE) 2 % solution Patient: Mix 1part 2% viscous lidocaine, 1part H20. Swish & swallow 3mL of diluted mixture, 68min before meals and at bedtime, up to QID 200 mL 4  . lidocaine-prilocaine (EMLA) cream Apply 1 application topically as needed. 30 g 2  . metoCLOPramide (REGLAN) 5 MG/5ML solution Place 5 mLs (5 mg total) into feeding tube 3 (three) times daily before meals. 240 mL 2  . nitroGLYCERIN (NITROSTAT) 0.4 MG SL tablet Place 1 tablet (0.4 mg total) under the tongue every 5 (five) minutes as needed. Place 1 tablet under tongue for chest pain -- not to exceed 3 in 15 minutes 100 tablet 11  . nortriptyline (PAMELOR) 10 MG capsule Take 1 capsule every night for 1 week, then increase to 2 capsules every night (Patient not taking: Reported on 08/05/2020) 60 capsule 11  . Nutritional Supplements (FEEDING SUPPLEMENT, OSMOLITE 1.5 CAL,) LIQD Give 1-1/2 cartons Osmolite 1.5 via PEG 4 times daily with 60 mL free water before and 120 mL free water after each bolus feeding.  This provides 2130 cal, 89.4 g protein, 1806 mL free water/100% estimated nutrition needs.  Please send formula and supplies. 1422 mL 6  . ondansetron (ZOFRAN ODT) 4 MG disintegrating tablet Take 1 tablet (4 mg total) by mouth every 8 (eight) hours as needed for  nausea or vomiting. (Patient not taking: Reported on 08/05/2020) 20 tablet 0  . ondansetron (ZOFRAN) 4 MG/5ML solution Take 7.5 mLs (6 mg total) by mouth every 8 (eight) hours as needed for nausea or vomiting. Okay to take by tube, if preferred. Take around the clock, 3 x's daily, if nausea is severe. 473 mL 5  . oxyCODONE (ROXICODONE) 5 MG/5ML solution PLACE 5 MLS INTO THE FEEDING TUBE EVERY 4 HOURS AS NEEDED FOR SEVERE PAIN 240 mL 0  . prochlorperazine (COMPAZINE) 10 MG tablet  Take 1 tablet (10 mg total) by mouth every 6 (six) hours as needed for nausea or vomiting. (Patient not taking: Reported on 07/31/2020) 30 tablet 0  . promethazine (PHENERGAN) 6.25 MG/5ML syrup 6.25 to 12.5 mg Q 6 hours VT prn nausea 473 mL 2  . rOPINIRole (REQUIP) 0.25 MG tablet TAKE 1 TABLET BY MOUTH 2 HOURS BEFORE BEDTIME (Patient not taking: Reported on 08/05/2020) 90 tablet 3  . tiZANidine (ZANAFLEX) 4 MG tablet Take 1 tablet (4 mg total) by mouth at bedtime. (Patient not taking: Reported on 08/05/2020) 30 tablet 11  . traMADol (ULTRAM) 50 MG tablet TAKE 1 TABLET BY MOUTH EVERY NIGHT (Patient not taking: Reported on 08/05/2020) 90 tablet 3   No current facility-administered medications for this visit.   Facility-Administered Medications Ordered in Other Visits  Medication Dose Route Frequency Provider Last Rate Last Admin  . heparin lock flush 100 unit/mL  250 Units Intracatheter Once PRN Wyatt Portela, MD      . sodium chloride flush (NS) 0.9 % injection 10 mL  10 mL Intracatheter Once PRN Wyatt Portela, MD      . sodium chloride flush (NS) 0.9 % injection 10 mL  10 mL Intracatheter PRN Wyatt Portela, MD   10 mL at 07/30/20 1520     Allergies: No Known Allergies   Physical Exam:    Blood pressure 104/65, pulse 96, temperature 97.9 F (36.6 C), temperature source Tympanic, resp. rate 18, height 5\' 5"  (1.651 m), weight 110 lb 3.2 oz (50 kg), SpO2 96 %.     ECOG: 1    General appearance: Comfortable appearing without any discomfort Head: Normocephalic without any trauma Oropharynx: Mucous membranes are moist and pink without any thrush or ulcers. Eyes: Pupils are equal and round reactive to light. Lymph nodes: No cervical, supraclavicular, inguinal or axillary lymphadenopathy.   Heart:regular rate and rhythm.  S1 and S2 without leg edema. Lung: Clear without any rhonchi or wheezes.  No dullness to percussion. Abdomin: Soft, nontender, nondistended with good bowel sounds.  No  hepatosplenomegaly. Musculoskeletal: No joint deformity or effusion.  Full range of motion noted. Neurological: No deficits noted on motor, sensory and deep tendon reflex exam. Skin: No petechial rash or dryness.  Appeared moist.         Lab Results: Lab Results  Component Value Date   WBC 11.3 (H) 09/22/2020   HGB 10.1 (L) 09/22/2020   HCT 30.8 (L) 09/22/2020   MCV 96.3 09/22/2020   PLT 337 09/22/2020     Chemistry      Component Value Date/Time   NA 132 (L) 09/22/2020 1208   K 5.0 09/22/2020 1208   CL 93 (L) 09/22/2020 1208   CO2 32 09/22/2020 1208   BUN 22 09/22/2020 1208   CREATININE 1.10 09/22/2020 1208   CREATININE 1.00 06/27/2015 1222      Component Value Date/Time   CALCIUM 9.2 09/22/2020 1208   ALKPHOS 107 09/22/2020 1208  AST 16 09/22/2020 1208   ALT 11 09/22/2020 1208   BILITOT 0.4 09/22/2020 1208         Impression and Plan:  1.    Stage III squamous cell carcinoma of the right nasopharynx diagnosed in July 2021.  He completed the definitive therapy with radiation concomitantly with cisplatin.  He is still dealing with residual complications including mucositis and dehydration.  I recommended continued supportive management at this time and will repeat imaging studies tentatively January 2022.  2.  IV access: Port-A-Cath currently in place and will be flushed periodically.  3.  Nutrition: He still receiving the majority of his feeding tube which will continue for the time being.  He is receiving adequate nutrition although he has lost some weight.  He continues to follow with dietitian.   4.  Antiemetics: No nausea or vomiting reported.  Antiemetics are available to him.  5.  Renal function surveillance: kidney function remains close to normal range at this time.  Laboratory data from 1025 2021 were reviewed and discussed with him today.  6.  Pain: Manageable at this time with fentanyl patch and few breakthrough episodes.   7.     Hyponatremia: Improved with hydration with his his sodium continues to approaching normal levels.  8.  Follow-up: We will continue to follow periodically for labs and Port-A-Cath flush.  He will have MD follow-up in 3 to 4 weeks.  30  minutes were spent on this encounter.  The time was dedicated to reviewing his disease status, discussing treatment options and future plan of care review.  Zola Button, MD 10/26/202111:20 AM

## 2020-09-24 ENCOUNTER — Telehealth: Payer: Self-pay | Admitting: *Deleted

## 2020-09-24 NOTE — Telephone Encounter (Signed)
PATIENT  HAS AN APPT. WITH DR. Davy Pique ON 10-01-20 @ 8:30 AM WITH DR. Davy Pique, PATIENT IS AWARE OF THIS APPT.

## 2020-09-25 ENCOUNTER — Inpatient Hospital Stay: Payer: Medicare HMO

## 2020-09-25 ENCOUNTER — Other Ambulatory Visit: Payer: Self-pay

## 2020-09-25 ENCOUNTER — Inpatient Hospital Stay: Payer: Medicare HMO | Admitting: General Practice

## 2020-09-25 VITALS — BP 109/69 | HR 99 | Temp 98.2°F | Resp 18

## 2020-09-25 DIAGNOSIS — C119 Malignant neoplasm of nasopharynx, unspecified: Secondary | ICD-10-CM

## 2020-09-25 MED ORDER — SODIUM CHLORIDE 0.9 % IV SOLN
Freq: Once | INTRAVENOUS | Status: AC
  Filled 2020-09-25: qty 250

## 2020-09-25 MED ORDER — HEPARIN SOD (PORK) LOCK FLUSH 100 UNIT/ML IV SOLN
500.0000 [IU] | Freq: Once | INTRAVENOUS | Status: AC | PRN
  Administered 2020-09-25: 500 [IU]
  Filled 2020-09-25: qty 5

## 2020-09-25 MED ORDER — SODIUM CHLORIDE 0.9% FLUSH
10.0000 mL | Freq: Once | INTRAVENOUS | Status: AC | PRN
  Administered 2020-09-25: 10 mL
  Filled 2020-09-25: qty 10

## 2020-09-25 NOTE — Progress Notes (Signed)
[facility] CSW Progress Notes  Brief visit as patient was checking into infusion.  Provided second [employer].   Edwyna Shell, LCSW Clinical Social Worker Phone:  [phone]

## 2020-09-25 NOTE — Patient Instructions (Signed)

## 2020-09-29 ENCOUNTER — Other Ambulatory Visit: Payer: Self-pay | Admitting: Medical

## 2020-09-29 DIAGNOSIS — R112 Nausea with vomiting, unspecified: Secondary | ICD-10-CM

## 2020-09-29 DIAGNOSIS — G893 Neoplasm related pain (acute) (chronic): Secondary | ICD-10-CM

## 2020-09-29 DIAGNOSIS — C119 Malignant neoplasm of nasopharynx, unspecified: Secondary | ICD-10-CM

## 2020-09-29 MED ORDER — PROMETHAZINE HCL 6.25 MG/5ML PO SYRP: ORAL_SOLUTION | ORAL | 2 refills | Status: DC

## 2020-09-29 MED ORDER — ONDANSETRON HCL 4 MG/5ML PO SOLN: 6.0000 mg | Freq: Three times a day (TID) | ORAL | 5 refills | Status: DC | PRN

## 2020-09-29 MED ORDER — FENTANYL 50 MCG/HR TD PT72: MEDICATED_PATCH | TRANSDERMAL | 0 refills | Status: DC

## 2020-09-29 MED FILL — fentaNYL 50 MCG/HR PT72: 50 | 30 days supply | Qty: 10 | Fill #0

## 2020-09-29 MED FILL — ONDANSETRON 4 MG/5 ML SOLN: 4 | 31 days supply | Qty: 473 | Fill #0

## 2020-09-29 MED FILL — PROMETHAZINE 6.25 MG/5 ML S: 6.25 | 11 days supply | Qty: 473 | Fill #0

## 2020-09-30 ENCOUNTER — Other Ambulatory Visit: Payer: Self-pay

## 2020-09-30 ENCOUNTER — Other Ambulatory Visit: Payer: Self-pay | Admitting: Oncology

## 2020-09-30 ENCOUNTER — Inpatient Hospital Stay: Payer: Medicare HMO | Attending: Oncology | Admitting: Nutrition

## 2020-09-30 DIAGNOSIS — R112 Nausea with vomiting, unspecified: Secondary | ICD-10-CM

## 2020-09-30 DIAGNOSIS — G893 Neoplasm related pain (acute) (chronic): Secondary | ICD-10-CM

## 2020-09-30 DIAGNOSIS — C119 Malignant neoplasm of nasopharynx, unspecified: Secondary | ICD-10-CM | POA: Insufficient documentation

## 2020-09-30 DIAGNOSIS — R110 Nausea: Secondary | ICD-10-CM

## 2020-09-30 MED ORDER — FENTANYL 50 MCG/HR TD PT72
MEDICATED_PATCH | TRANSDERMAL | 0 refills | Status: DC
Start: 2020-09-30 — End: 2020-10-10

## 2020-09-30 MED ORDER — PROMETHAZINE HCL 6.25 MG/5ML PO SYRP: ORAL_SOLUTION | ORAL | 2 refills | Status: DC

## 2020-09-30 MED ORDER — METOCLOPRAMIDE HCL 5 MG/5ML PO SOLN: 5.0000 mg | Freq: Three times a day (TID) | ORAL | 2 refills | Status: AC

## 2020-09-30 MED FILL — METOCLOPRAMIDE 5 MG/5 ML SY: 10 | 16 days supply | Qty: 240 | Fill #0

## 2020-09-30 NOTE — Progress Notes (Signed)
Nutrition follow up completed with patient s/p treatment of nasopharyngeal cancer. Weight improved slightly and documented as 108.8 lb today, increased from 107 pounds on October 18. Patient reports tolerating  5 cartons of Osmolite 1.5 daily via G-tube. He has been giving extra water with the feeding so it infuses faster. He estimates he infuses 32 oz water daily. Denies N, V, C and D. He completes his swallowing exercises daily. He has adequate formula and supplies.  Estimated Nutrition [place]: 1800-2000 kcal, 62-82 gm pro, 2 L fluid  Nutrition Diagnosis: Unintentional wt loss improved.  Intervention: Educated patient to increase Osmolite 1.5 to 6 cartons daily. Give 1.5 cartons QID with 60 mL water before and 120 mL water after bolus feeding. In addition give 240 mL water BID between feelings. Reminded him to reorder tube feeding through [facility] when he is down to one weeks supply.  6 cartons Osmolite 1.5 plus free water provides 2130 kcal, 89.4 gm prot, 2286 mL free  water.  Monitoring, Evaluation, Goals: Patient will tolerate TF to meet >90% estimated nutrition needs for weight gain.  Next Visit: Wednesday, Dec 1 at 3:15 pm.

## 2020-10-03 ENCOUNTER — Telehealth: Payer: Self-pay | Admitting: Oncology

## 2020-10-03 NOTE — Telephone Encounter (Signed)
Scheduled per los, patient has been called and notified. 

## 2020-10-06 ENCOUNTER — Other Ambulatory Visit: Payer: Self-pay | Admitting: Oncology

## 2020-10-06 DIAGNOSIS — G893 Neoplasm related pain (acute) (chronic): Secondary | ICD-10-CM

## 2020-10-06 MED FILL — oxyCODONE HCL 5 MG/5ML SOLN: 5 | 8 days supply | Qty: 240 | Fill #0

## 2020-10-07 ENCOUNTER — Telehealth: Payer: Self-pay | Admitting: Nutrition

## 2020-10-07 ENCOUNTER — Inpatient Hospital Stay: Payer: Medicare HMO

## 2020-10-07 ENCOUNTER — Other Ambulatory Visit: Payer: Self-pay

## 2020-10-07 DIAGNOSIS — C119 Malignant neoplasm of nasopharynx, unspecified: Secondary | ICD-10-CM

## 2020-10-07 LAB — CMP (CANCER CENTER ONLY)
ALT: 7 U/L (ref 0–44)
AST: 13 U/L — ABNORMAL LOW (ref 15–41)
Albumin: 2.9 g/dL — ABNORMAL LOW (ref 3.5–5.0)
Alkaline Phosphatase: 108 U/L (ref 38–126)
Anion gap: 6 (ref 5–15)
BUN: 21 mg/dL (ref 8–23)
CO2: 32 mmol/L (ref 22–32)
Calcium: 8.8 mg/dL — ABNORMAL LOW (ref 8.9–10.3)
Chloride: 96 mmol/L — ABNORMAL LOW (ref 98–111)
Creatinine: 0.83 mg/dL (ref 0.61–1.24)
GFR, Estimated: >60 mL/min (ref >60)
Glucose, Bld: 131 mg/dL — ABNORMAL HIGH (ref 70–99)
Potassium: 4.3 mmol/L (ref 3.5–5.1)
Sodium: 134 mmol/L — ABNORMAL LOW (ref 135–145)
Total Bilirubin: 0.6 mg/dL (ref 0.3–1.2)
Total Protein: 6.5 g/dL (ref 6.5–8.1)

## 2020-10-07 LAB — CBC WITH DIFFERENTIAL (CANCER CENTER ONLY)
Abs Immature Granulocytes: 0.05 10*3/uL (ref 0.00–0.07)
Basophils Absolute: 0.1 10*3/uL (ref 0.0–0.1)
Basophils Relative: 0 %
Eosinophils Absolute: 0 10*3/uL (ref 0.0–0.5)
Eosinophils Relative: 0 %
HCT: 31.7 % — ABNORMAL LOW (ref 39.0–52.0)
Hemoglobin: 10.6 g/dL — ABNORMAL LOW (ref 13.0–17.0)
Immature Granulocytes: 0 %
Lymphocytes Relative: 3 %
Lymphs Abs: 0.3 10*3/uL — ABNORMAL LOW (ref 0.7–4.0)
MCH: 31.8 pg (ref 26.0–34.0)
MCHC: 33.4 g/dL (ref 30.0–36.0)
MCV: 95.2 fL (ref 80.0–100.0)
Monocytes Absolute: 0.8 10*3/uL (ref 0.1–1.0)
Monocytes Relative: 7 %
Neutro Abs: 10.2 10*3/uL — ABNORMAL HIGH (ref 1.7–7.7)
Neutrophils Relative %: 90 %
Platelet Count: 332 10*3/uL (ref 150–400)
RBC: 3.33 MIL/uL — ABNORMAL LOW (ref 4.22–5.81)
RDW: 14.7 % (ref 11.5–15.5)
WBC Count: 11.5 10*3/uL — ABNORMAL HIGH (ref 4.0–10.5)
nRBC: 0 % (ref 0.0–0.2)

## 2020-10-07 MED ORDER — HEPARIN SOD (PORK) LOCK FLUSH 100 UNIT/ML IV SOLN
500.0000 [IU] | Freq: Once | INTRAVENOUS | Status: AC | PRN
  Administered 2020-10-07: 500 [IU]
  Filled 2020-10-07: qty 5

## 2020-10-07 MED ORDER — SODIUM CHLORIDE 0.9% FLUSH
10.0000 mL | Freq: Once | INTRAVENOUS | Status: AC | PRN
  Administered 2020-10-07: 10 mL
  Filled 2020-10-07: qty 10

## 2020-10-07 NOTE — Telephone Encounter (Signed)
Received call from patient's sister-in-law, Willie Kennedy, requesting assistance with helping patient to begin taking oral intake.  Patient has been refusing some liquids because they are too thick as well as some solids because they are too gritty.  Willie Kennedy is frustrated and does not know how to convince patient to begin eating.  Educated to offer patient beverages he enjoyed in the past.  Encouraged her to thin or thickened liquids as needed per patient patient preference. Also offer patient soft smooth solids and adjust textures and consistencies as needed.  Encourage patient to try to eat or drink twice daily between tube feedings. Willie Kennedy stated understanding and appreciation.

## 2020-10-09 NOTE — Progress Notes (Signed)
Willie Kennedy presents today for follow-up after completing radiation to his nasopharynx on 09/17/2020  Pain issues, if any: Reports 9 out of 10 throat pain. States he takes his prn pain medicine but it doesn't seem to alleviate the discomfort Using a feeding tube?: Yes--reports 6-7 feedings a day;09/30/2020 Had F/U with Bard Neff-RD: "Patient reports tolerating  5 cartons of Osmolite 1.5 daily via G-tube. He has been giving extra water with the feeding so it infuses faster. He estimates he infuses 32 oz water daily. Denies N, V, C and D. He completes his swallowing exercises daily. He has adequate formula and supplies.  Intervention: Educated patient to increase Osmolite 1.5 to 6 cartons daily. Give 1.5 cartons QID with 60 mL water before and 120 mL water after bolus feeding. In addition give 240 mL water BID between feelings. Reminded him to reorder tube feeding through [place] when he is down to one weeks supply." Weight changes, if any:  Wt Readings from Last 3 Encounters:  10/10/20 108 lb 4 oz (49.1 kg)  09/30/20 108 lb 12.8 oz (49.4 kg)  09/23/20 110 lb 3.2 oz (50 kg)   Swallowing issues, if any: Yes--he can only toelrate small sips of liquid occasionally. He is very limited by throat pain and intermittent nausea; 09/18/2020 Met with Glendell Docker Shincke-SLP: "Pt presents today reporting coughing occurs regularly with 1-2 sips of water when pt attempts, occasionally during the week. SLP reviewed pt's individualized HEP for dysphagia and pt completed each exercise on their own with total min-mod cues faded to modified independent. There are no overt s/s aspiration PNA reported by pt at this time so SLP believes pt will cont to be safe with attempts at PO." Smoking or chewing tobacco? None Using fluoride trays daily? N/A Last ENT visit was on: 09/26/2020 Saw Dr. Melida Quitter:  "- He recently completed treatment for advanced nasopharyngeal cancer, an acute illness that poses threat to life. I  recommended waiting until the next visit in one month for nasopharyngoscopy. - The T-tube was retrieved from the right ear via minor procedure as it is no longer necessary. For otorrhea, I recommended treatment with Cipro and Decadron drops, prescription drugs, for one week when needed. 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 This may make it difficult to advance his diet. I am hopeful that this will improve as he recovers from treatment."  Other notable issues, if any: Reports swelling along the right/medial aspect of his throat (painful to the touch as well). Continues to deal with copious amounts of thick secretions. Reports he's not sleeping well because of the secretions and pain. Had F/U with Dr. Zola Button on 09/23/2020: "He completed the definitive therapy with radiation concomitantly with cisplatin.  He is still dealing with residual complications including mucositis and dehydration.  I recommended continued supportive management at this time and will repeat imaging studies tentatively January 2022. Follow-up: We will continue to follow periodically for labs and Port-A-Cath flush.  He will have MD follow-up in 3 to 4 weeks."  Vitals:   10/10/20 1053  BP: 100/74  Pulse: 92  Resp: 18  Temp: 97.8 F (36.6 C)  SpO2: 98%

## 2020-10-10 ENCOUNTER — Ambulatory Visit
Admission: RE | Admit: 2020-10-10 | Discharge: 2020-10-10 | Disposition: A | Discharge status: Home / Self Care | Payer: Medicare HMO | Source: Ambulatory Visit | Attending: Radiation Oncology | Admitting: Radiation Oncology

## 2020-10-10 ENCOUNTER — Other Ambulatory Visit: Payer: Self-pay

## 2020-10-10 VITALS — BP 100/74 | HR 92 | Temp 97.8°F | Resp 18 | Ht 60.0 in | Wt 108.2 lb

## 2020-10-10 DIAGNOSIS — C119 Malignant neoplasm of nasopharynx, unspecified: Secondary | ICD-10-CM

## 2020-10-10 DIAGNOSIS — R110 Nausea: Secondary | ICD-10-CM | POA: Diagnosis not present

## 2020-10-10 DIAGNOSIS — C118 Malignant neoplasm of overlapping sites of nasopharynx: Secondary | ICD-10-CM | POA: Insufficient documentation

## 2020-10-10 DIAGNOSIS — G893 Neoplasm related pain (acute) (chronic): Secondary | ICD-10-CM

## 2020-10-10 DIAGNOSIS — R059 Cough, unspecified: Secondary | ICD-10-CM | POA: Diagnosis not present

## 2020-10-10 DIAGNOSIS — R070 Pain in throat: Secondary | ICD-10-CM | POA: Insufficient documentation

## 2020-10-10 DIAGNOSIS — Z923 Personal history of irradiation: Secondary | ICD-10-CM | POA: Diagnosis not present

## 2020-10-10 DIAGNOSIS — Z79899 Other long term (current) drug therapy: Secondary | ICD-10-CM | POA: Insufficient documentation

## 2020-10-10 DIAGNOSIS — R4922 Hyponasality: Secondary | ICD-10-CM | POA: Insufficient documentation

## 2020-10-10 DIAGNOSIS — C111 Malignant neoplasm of posterior wall of nasopharynx: Secondary | ICD-10-CM

## 2020-10-10 MED ORDER — FENTANYL 75 MCG/HR TD PT72: 1.0000 | MEDICATED_PATCH | TRANSDERMAL | 0 refills | Status: DC

## 2020-10-10 MED ORDER — OXYCODONE HCL 5 MG/5ML PO SOLN: 7.5000 mg | ORAL | 0 refills | Status: DC | PRN

## 2020-10-10 MED FILL — fentaNYL 75 MCG/HR PT72: 75 | 30 days supply | Qty: 10 | Fill #0

## 2020-10-10 MED FILL — oxyCODONE HCL 5 MG/5ML SOLN: 5 | 11 days supply | Qty: 500 | Fill #0

## 2020-10-10 NOTE — Progress Notes (Signed)
Oncology Nurse Navigator Documentation  I met with Willie Kennedy during his follow up visit with Dr. Isidore Moos today and he was accompanied by his step-son. [name] is continuing to recover from his cancer treatment. He is scheduled to see Garald Balding SLP on 11/18 and Dr. Alen Blew on 11/24. [name] continues to describe his pain a 9/10 at times. It radiates from his mid anterior neck up through the right side of is face to above his right ear. He is not receiving relief from his current pain regimen. Dr. Isidore Moos sent in prescriptions for increases in current medicine and also provided written instructions to Mr. Krabill and his family regarding questions that they had. He will follow up with his pain medicine specialist and Dr. Alen Blew as scheduled. An order was placed for his post-treatment PET scan and he will see Dr. Isidore Moos again on 12/23/20 for results. They know to call me if they have any further questions or concerns.   Harlow Asa RN, BSN, OCN Head & Neck Oncology Nurse [place] [facility] Phone # [phone]  Fax # [phone]

## 2020-10-13 ENCOUNTER — Telehealth: Payer: Self-pay | Admitting: Nutrition

## 2020-10-13 ENCOUNTER — Encounter: Payer: Self-pay | Admitting: Radiation Oncology

## 2020-10-13 NOTE — Progress Notes (Signed)
Radiation Oncology         [phone] ________________________________  Name: Willie Kennedy MRN: [id number]  Date: 10/10/2020  DOB: 02-28-1951  Follow-Up Visit Note  CC: Patient, No Pcp Per  Melida Quitter, MD  Diagnosis and Prior Radiotherapy:       ICD-10-CM   1. Nasopharyngeal cancer ([place])  C11.9 fentaNYL (DURAGESIC) 75 MCG/HR  2. Neoplasm related pain  G89.3 oxyCODONE (ROXICODONE) 5 MG/5ML solution  3. Carcinoma of posterior wall of nasopharynx ([place])  C11.1     CHIEF COMPLAINT:  Here for follow-up and surveillance of nasopharyngeal cancer  Narrative:  The patient returns today for routine follow-up.   Willie Kennedy presents today for follow-up after completing radiation to his nasopharynx on 09/17/2020  Pain issues, if any: Reports 9 out of 10 throat pain. States he takes his prn pain medicine but it doesn't seem to alleviate the discomfort Using a feeding tube?: Yes--reports 6-7 feedings a day;09/30/2020 Had F/U with Bard Neff-RD: "Patient reports tolerating  5 cartons of Osmolite 1.5 daily via G-tube. He has been giving extra water with the feeding so it infuses faster. He estimates he infuses 32 oz water daily. Denies N, V, C and D. He completes his swallowing exercises daily. He has adequate formula and supplies.  Intervention: Educated patient to increase Osmolite 1.5 to 6 cartons daily. Give 1.5 cartons QID with 60 mL water before and 120 mL water after bolus feeding. In addition give 240 mL water BID between feelings. Reminded him to reorder tube feeding through [place] when he is down to one weeks supply." Weight changes, if any:  Wt Readings from Last 3 Encounters:  10/10/20 108 lb 4 oz (49.1 kg)  09/30/20 108 lb 12.8 oz (49.4 kg)  09/23/20 110 lb 3.2 oz (50 kg)   Swallowing issues, if any: Yes--he can only toelrate small sips of liquid occasionally. He is very limited by throat pain and intermittent nausea; 09/18/2020 Met with Glendell Docker Shincke-SLP: "Pt presents  today reporting coughing occurs regularly with 1-2 sips of water when pt attempts, occasionally during the week. SLP reviewed pt's individualized HEP for dysphagia and pt completed each exercise on their own with total min-mod cues faded to modified independent. There are no overt s/s aspiration PNA reported by pt at this time so SLP believes pt will cont to be safe with attempts at PO." Smoking or chewing tobacco? None Using fluoride trays daily? N/A Last ENT visit was on: 09/26/2020 Saw Dr. Melida Quitter:  "- He recently completed treatment for advanced nasopharyngeal cancer, an acute illness that poses threat to life. I recommended waiting until the next visit in one month for nasopharyngoscopy. - The T-tube was retrieved from the right ear via minor procedure as it is no longer necessary. For otorrhea, I recommended treatment with Cipro and Decadron drops, prescription drugs, for one week when needed. 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 This may make it difficult to advance his diet. I am hopeful that this will improve as he recovers from treatment."  Other notable issues, if any: Reports swelling along the right/medial aspect of his throat (painful to the touch as well). Continues to deal with copious amounts of thick secretions. Reports he's not sleeping well because of the secretions and pain. Had F/U with Dr. Zola Button on 09/23/2020: "He completed the definitive therapy with radiation concomitantly with cisplatin.  He is still dealing with residual complications including mucositis and dehydration.  I recommended continued  supportive management at this time and will repeat imaging studies tentatively January 2022. Follow-up: We will continue to follow periodically for labs and Port-A-Cath flush.  He will have MD follow-up in 3 to 4 weeks."  Vitals:   10/10/20 1053  BP: 100/74  Pulse: 92  Resp: 18  Temp: 97.8 F (36.6 C)  SpO2: 98%                       ALLERGIES:  has No Known Allergies.  Meds: Current Outpatient Medications  Medication Sig Dispense Refill  . amoxicillin-clavulanate (AUGMENTIN) 125-31.25 MG/5ML suspension Take 5 mLs (125 mg total) by mouth 3 (three) times daily. 150 mL 0  . atorvastatin (LIPITOR) 20 MG tablet Take 20 mg by mouth at bedtime.  (Patient not taking: Reported on 08/05/2020)    . dexamethasone (DECADRON) 0.5 MG/5ML solution Take 38m BID by PEG (with food). On 9/14, taper to 411mdaily with food. On 9/21, taper to 2091maily with food.  On 9/28, taper to 54m19mily with food. Stop on 10/9. 500 mL 2  . fentaNYL (DURAGESIC) 75 MCG/HR Place 1 patch onto the skin every 3 (three) days for 3 days. 10 patch 0  . gabapentin (NEURONTIN) 250 MG/5ML solution Take 6 mLs (300 mg total) by mouth 3 (three) times daily. May take by PEG tube. 470 mL 3  . lansoprazole (PREVACID SOLUTAB) 15 MG disintegrating tablet Dissolve one tablet in mouth daily.  (Affordable alternative to previous liquid Rx for Omeprazole). 30 tablet 1  . lidocaine (XYLOCAINE) 2 % solution Patient: Mix 1part 2% viscous lidocaine, 1part H20. Swish & swallow 54mL38mdiluted mixture, 30min34more meals and at bedtime, up to QID 200 mL 4  . lidocaine-prilocaine (EMLA) cream Apply 1 application topically as needed. 30 g 2  . metoCLOPramide (REGLAN) 5 MG/5ML solution Place 5 mLs (5 mg total) into feeding tube 3 (three) times daily before meals. 240 mL 2  . nitroGLYCERIN (NITROSTAT) 0.4 MG SL tablet Place 1 tablet (0.4 mg total) under the tongue every 5 (five) minutes as needed. Place 1 tablet under tongue for chest pain -- not to exceed 3 in 15 minutes 100 tablet 11  . nortriptyline (PAMELOR) 10 MG capsule Take 1 capsule every night for 1 week, then increase to 2 capsules every night (Patient not taking: Reported on 08/05/2020) 60 capsule 11  . Nutritional Supplements (FEEDING SUPPLEMENT, OSMOLITE 1.5 CAL,) LIQD Give 1-1/2 cartons Osmolite 1.5 via PEG 4 times daily with 60  mL free water before and 120 mL free water after each bolus feeding.  This provides 2130 cal, 89.4 g protein, 1806 mL free water/100% estimated nutrition needs.  Please send formula and supplies. 1422 mL 6  . ondansetron (ZOFRAN ODT) 4 MG disintegrating tablet Take 1 tablet (4 mg total) by mouth every 8 (eight) hours as needed for nausea or vomiting. (Patient not taking: Reported on 08/05/2020) 20 tablet 0  . ondansetron (ZOFRAN) 4 MG/5ML solution Take 7.5 mLs (6 mg total) by mouth every 8 (eight) hours as needed for nausea or vomiting. Okay to take by tube, if preferred. Take around the clock, 3 x's daily, if nausea is severe. 473 mL 5  . oxyCODONE (ROXICODONE) 5 MG/5ML solution Place 7.5 mLs (7.5 mg total) into feeding tube every 4 (four) hours as needed for severe pain (Take with food.). 500 mL 0  . prochlorperazine (COMPAZINE) 10 MG tablet Take 1 tablet (10 mg total) by mouth every 6 (  six) hours as needed for nausea or vomiting. (Patient not taking: Reported on 07/31/2020) 30 tablet 0  . promethazine (PHENERGAN) 6.25 MG/5ML syrup 6.25 to 12.5 mg Q 6 hours VT prn nausea 473 mL 2  . rOPINIRole (REQUIP) 0.25 MG tablet TAKE 1 TABLET BY MOUTH 2 HOURS BEFORE BEDTIME (Patient not taking: Reported on 08/05/2020) 90 tablet 3  . tiZANidine (ZANAFLEX) 4 MG tablet Take 1 tablet (4 mg total) by mouth at bedtime. (Patient not taking: Reported on 08/05/2020) 30 tablet 11  . traMADol (ULTRAM) 50 MG tablet TAKE 1 TABLET BY MOUTH EVERY NIGHT (Patient not taking: Reported on 08/05/2020) 90 tablet 3   No current facility-administered medications for this encounter.   Facility-Administered Medications Ordered in Other Encounters  Medication Dose Route Frequency Provider Last Rate Last Admin  . heparin lock flush 100 unit/mL  250 Units Intracatheter Once PRN Wyatt Portela, MD      . sodium chloride flush (NS) 0.9 % injection 10 mL  10 mL Intracatheter Once PRN Wyatt Portela, MD      . sodium chloride flush (NS) 0.9 %  injection 10 mL  10 mL Intracatheter PRN Wyatt Portela, MD   10 mL at 07/30/20 1520    Physical Findings: The patient is in no acute distress. Patient is alert and oriented. Wt Readings from Last 3 Encounters:  10/10/20 108 lb 4 oz (49.1 kg)  09/30/20 108 lb 12.8 oz (49.4 kg)  09/23/20 110 lb 3.2 oz (50 kg)    height is 5' (1.524 m) and weight is 108 lb 4 oz (49.1 kg). His temporal temperature is 97.8 F (36.6 C). His blood pressure is 100/74 and his pulse is 92. His respiration is 18 and oxygen saturation is 98%. .  General: Alert and oriented, in obvious pain, sitting in a wheelchair HEENT:  Oropharynx is notable for no thrush Neck: Neck is notable for no palpable adenopathy.  Skin is dry and healing from radiation therapy Skin: Skin in treatment fields shows satisfactory healing with residual dryness Psychiatric: Judgment and insight are intact. Affect is appropriate.   Lab Findings: Lab Results  Component Value Date   WBC 11.5 (H) 10/07/2020   HGB 10.6 (L) 10/07/2020   HCT 31.7 (L) 10/07/2020   MCV 95.2 10/07/2020   PLT 332 10/07/2020    Lab Results  Component Value Date   TSH 1.196 07/21/2020    Radiographic Findings: No results found.  Impression/Plan:    1) Head and Neck Cancer Status: Healing from chemoradiation therapy and dealing with persistent severe pain.  I assume this is related to the right occipital calvarial fracture that was present on imaging in late August, related to his nasopharyngeal disease. His main concern today is the severe pain.  He was seen by Dr. Davy Pique and states that he received some type of injection which temporarily helped the pain with mild effect but no lasting effect.  Today I will increase his dose of narcotics by 50% as will be some time until he follows up with his original prescriber, Dr. Alen Blew.  Continue to follow with Dr. Davy Pique as well - pain specialist at [facility].  2) Nutritional Status: Suboptimal  nutritional intake continues, continue to work with nutritionist PEG tube: Using  3) Risk Factors: The patient has been educated about risk factors including alcohol and tobacco abuse; they understand that avoidance of alcohol and tobacco is important to prevent recurrences as well as other cancers  4) Swallowing:  Continue speech-language pathology exercises, dealing with dysphagia  5) Dental: Encouraged to continue regular followup with dentistry as indicated; MULTIPLE EXTRACTIONS WITH ALVEOLOPLASTY N/A 07/17/2020  Procedure: Extraction of tooth #'s 3,38,[id number],LZJ 31 with alveoloplasty; Surgeon: Enrique Sack  6) Thyroid function: Check annually Lab Results  Component Value Date   TSH 1.196 07/21/2020    7) Other: Follow-up as scheduled with ENT and medical oncology  8) Follow-up in late January with PET scan for restaging - he and his family know to contact us with any questions before then.  On date of service, in total, I spent 25 minutes on this encounter. Patient was seen in person. _____________________________________   Eppie Gibson, MD

## 2020-10-14 NOTE — Telephone Encounter (Signed)
Opened in error

## 2020-10-16 ENCOUNTER — Other Ambulatory Visit: Payer: Self-pay

## 2020-10-16 ENCOUNTER — Ambulatory Visit: Payer: Medicare HMO | Attending: Radiation Oncology

## 2020-10-16 DIAGNOSIS — R578 Other shock: Secondary | ICD-10-CM | POA: Diagnosis not present

## 2020-10-16 DIAGNOSIS — C111 Malignant neoplasm of posterior wall of nasopharynx: Secondary | ICD-10-CM | POA: Diagnosis not present

## 2020-10-16 DIAGNOSIS — R1310 Dysphagia, unspecified: Secondary | ICD-10-CM

## 2020-10-16 NOTE — Patient Instructions (Addendum)
   Wait on the baby food for now. Do ice chips (as was suggested previous appointment as well) three times a day - 1/4 cup - until you are coughing and feel fatiged. Suction any excess and wait 2-3 hours before trying again. MAKE SURE YOU CLEAN OUT YOUR MOUTH EXTREMELY WELL AT LEAST 30 MINUTES BEFORE DOING THE ICE.  Continue to do the effortful swallow, pitch raise, chin-fist exercises. You can add the others in when you feel stronger.

## 2020-10-16 NOTE — Therapy (Signed)
[facility] [address] [place], [place], [address] Phone: [phone]   Fax:  [phone]  Speech Language Pathology Treatment  Patient Details  Name: Willie Kennedy MRN: [id number] Date of Birth: 1951/06/18 Referring Provider (SLP): Eppie Gibson, MD   Encounter Date: 10/16/2020   End of Session - 10/16/20 1356    Visit Number 3    Number of Visits 7    Date for SLP Re-Evaluation 11/12/20    Authorization Type [employer] - visits requested    SLP Start Time 1322    SLP Stop Time  1340    SLP Time Calculation (min) 18 min    Activity Tolerance Patient tolerated treatment well           Past Medical History:  Diagnosis Date  . Biceps tendon rupture    h/o Right biceps tendon rupture  . CAD (coronary artery disease)    a. ETT myoview (10/11) with EF 52%, reversible inferior perfusion defect, 1 mm inferolateral ST depression with exercise, 7"16" and stopped due to dyspnea and leg weakness -> LHC (10/11), EF 55%, 40% pRCA, 40% mRCA, 40% prox to mid CFX. medical treatment.  . Cancer ([place])    skin cancer behind left ear   . Dizziness    with bending over   . Hard of hearing    more per right ear  . Headache    with bright light   . History of bronchitis   . History of hiatal hernia   . Hyperlipidemia   . Impaired glucose tolerance   . Numbness and tingling in left arm    if holds arm upward too long pt states his primary MD is aware  . Osteoarthritis   . Peripheral vascular disease ([place]) 2005   a. s/p left fem-pop Dr.Lawson in 2005.  Marland Kitchen Shortness of breath dyspnea    walking distances or climbing stairs  . Tinnitus     Past Surgical History:  Procedure Laterality Date  . ds/p left femoral artery occlusion  2005   s/p left fem-pop bypass- Dr. Kellie Simmering  . HERNIA REPAIR    . IR GASTROSTOMY TUBE MOD SED  07/24/2020  . IR IMAGING GUIDED PORT INSERTION  07/24/2020  . MULTIPLE EXTRACTIONS WITH  ALVEOLOPLASTY N/A 07/17/2020   Procedure: Extraction of tooth #'s 9,47,09,62,EZM 31 with alveoloplasty;  Surgeon: Lenn Cal, DDS;  Location: [facility];  Service: Oral Surgery;  Laterality: N/A;  . MYRINGOTOMY WITH TUBE PLACEMENT Right 06/26/2020   Procedure: MYRINGOTOMY WITH TUBE PLACEMENT RIGHT  EAR;  Surgeon: Melida Quitter, MD;  Location: [facility];  Service: ENT;  Laterality: Right;  . NASAL ENDOSCOPY N/A 06/26/2020   Procedure: NASAL ENDOSCOPY W/NASOPHARYNGEAL BIOPSY;  Surgeon: Melida Quitter, MD;  Location: [facility];  Service: ENT;  Laterality: N/A;  . PR VEIN BYPASS GRAFT,AORTO-FEM-POP    . s/p right bicep tendon rupture repair    . VENTRAL HERNIA REPAIR N/A 08/22/2015   Procedure: OPEN VENTRAL HERNIA REPAIR WITH MESH;  Surgeon: Jackolyn Confer, MD;  [facility];  Service: General;  Laterality: N/A;    There were no vitals filed for this visit.   Subjective Assessment - 10/16/20 1328    Subjective "We almost cancelled on you today."    Patient is accompained by: --   brother in law   Currently in Pain? Yes    Pain Score 7     Pain Location Head    Pain  Orientation Right    Pain Descriptors / Indicators Aching;Sore;Headache    Pain Type Chronic pain    Pain Radiating Towards neck    Pain Onset More than a month ago    Pain Frequency Constant    Aggravating Factors  nothing    Effect of Pain on Daily Activities nothing                 ADULT SLP TREATMENT - 10/16/20 0001      General Information   Behavior/Cognition Alert;Cooperative      Treatment Provided   Treatment provided Dysphagia      Dysphagia Treatment   Temperature Spikes Noted No    Respiratory Status Room air    Oral Cavity - Dentition Edentulous    Treatment Methods Patient/caregiver education;Skilled observation    Patient observed directly with PO's No    Feeding Able to feed self    Other treatment/comments Pt expectorating all mucous today instead of  attempting to swallow. Pt showed SLP his mucous and very thick brown/yellow tinged with blood. Pt states "water chokes me worse than anything", and "I get about maybe 1 teaspoon of baby food down out of 3" due to limited pharyngeal clearance. Jeferson told SLP he is attempting baby food every day. SLP told pt to do ice chips TID instead of baby food, with thorough oral care at least 30 minutes prior.  Without POs, pt had three extended coughing/expectorating episodes in first 15 minutes of session. 17 mintues into session pt asked to leave due to fatigue. SLP told pt to cont to do effortful swallow with ice chips, and HEP including effortful, pitch raise, and chin tuck against resistance.. 18 total minutes for today's session. SLP to recommend modified (MBSS) to rule out aspiration and to assess feasibility of postural changes or textture changes to benefit pt PO intake and reduce pt's risk of aspiration with POs.      Assessment / Recommendations / Plan   Plan Continue with current plan of care   recommend modified barium swallow     Progression Toward Goals   Progression toward goals Not progressing toward goals (comment)            SLP Education - 10/16/20 1353    Education Details pt likely has muscle disuse atrophy, SLP wsa recommending a  ice chips instead of baby food, TID - until pt is fatigued then suction - and must complete ice chips within 30 minutes of thorough oral care, cont HEP with efforful swallow, pitch raise, chint uck against resistance    Person(s) Educated Patient;Caregiver(s)    Methods Explanation;Demonstration;Verbal cues    Comprehension Verbalized understanding;Need further instruction;Returned demonstration            SLP Short Term Goals - 10/16/20 1531      SLP SHORT TERM GOAL #1   Title pt will complete HEP with rare min A    Time 1    Period --   visits, for all STGs   Status Partially Met      SLP SHORT TERM GOAL #2   Title pt will tell SLP why pt is  completing HEP with modified independence    Time 1    Status Partially Met      SLP SHORT TERM GOAL #3   Title pt will describe 3 overt s/s aspiration PNA with modified independence    Time 1    Status Not Met   and ongoing  SLP SHORT TERM GOAL #4   Title pt will tell SLP how a food journal could hasten return to a more normalized diet    Time 1    Status On-going            SLP Long Term Goals - 10/16/20 1531      SLP LONG TERM GOAL #1   Title pt will complete HEP with modified independence over two visits    Time 2    Period --   visits, for all LTGs   Status On-going      SLP LONG TERM GOAL #2   Title pt will tell SLP why pt is completing HEP with modified independence over three visits    Time 2    Status On-going      SLP LONG TERM GOAL #3   Title pt will describe how to modify HEP over time, and the timeline associated with reduction in HEP frequency with modified independence over two sessions    Time 4    Status On-going            Plan - 10/16/20 1356    Clinical Impression Statement Pt presents today reporting consistent coughing with water, difficult pharyngeal clearance with pureeds each day (pt approximates ~one teaspoon out of 3 goes down. SLP verbally reviewed pt's individualized HEP for dysphagia and pt was unable to complete more than effortful swallow due to fatigue/expectorating thickened mucous. SLP recommends modified barium swallow to enable pt to know what postures or textures (if any) to attempt for safe POs and to better ID weakened musculature. SLP told pt today likely he has muscle disuse atrophy. Data indicate that pt's swallow ability will likely decrease over the course of radiation therapy and could very well decline over time following conclusion of their radiation therapy due to muscle disuse atrophy and/or muscle fibrosis. Pt will cont to need to be seen by SLP in order to assess safety of PO intake, assess the need for recommending any  objective swallow assessment, and ensuring pt correctly completes the individualized HEP.    Speech Therapy Frequency --   once every approx 4 weeks   Duration --   total 7 visits   Treatment/Interventions Aspiration precaution training;Pharyngeal strengthening exercises;Diet toleration management by SLP;Trials of upgraded texture/liquids;Patient/family education;SLP instruction and feedback;Compensatory strategies;Internal/external aids    Potential to Achieve Goals Fair    Potential Considerations Cooperation/participation level;Pain level;Severity of impairments   fatigue, ? depression   SLP Home Exercise Plan provided today    Consulted and Agree with Plan of Care Patient           Patient will benefit from skilled therapeutic intervention in order to improve the following deficits and impairments:   Dysphagia, unspecified type    Problem List Patient Active Problem List   Diagnosis Date Noted  . Carcinoma of posterior wall of nasopharynx ([place]) 07/18/2020  . Nasopharyngeal cancer ([facility]) 07/08/2020  . Neuropathy 07/11/2019  . DJD (degenerative joint disease) 07/11/2019  . Decreased ROM of left elbow 10/03/2017  . Pain in left wrist 09/26/2017  . Biceps rupture, distal, left, initial encounter 06/27/2017  . Neuropathic pain 05/09/2017  . Complicated migraine 95/07/3266  . Restless leg syndrome 02/23/2017  . Transient alteration of awareness 08/06/2016  . Hyperreflexia 08/06/2016  . Muscle cramps 08/06/2016  . Left leg pain 02/06/2016  . Impacted ear wax 07/04/2015  . Hearing loss of both ears 07/04/2015  . Ventral hernia 05/16/2015  . Nocturnal leg  cramps 05/16/2015  . Bruit 09/20/2014  . Chest pain 09/20/2014  . Aftercare following surgery of the circulatory system, [place] 08/07/2014  . Right elbow pain 01/10/2014  . Right leg pain 06/18/2013  . Goals of care, counseling/discussion 05/04/2013  . Impaired glucose tolerance 05/04/2013  . CAD, NATIVE VESSEL 10/01/2010  .  Nonspecific abnormal electrocardiogram (ECG) (EKG) 09/04/2010  . Hyperlipidemia 08/27/2010  . OSTEOARTHRITIS, HANDS, BILATERAL 08/27/2010  . Atherosclerosis of native arteries of extremity with intermittent claudication ([facility]) 09/06/2008    Alger ,Girard, Tarkio  10/16/2020, 3:33 PM  [facility] [address] [place], [place], [address] Phone: [phone]   Fax:  [phone]   Name: TIELER COURNOYER MRN: [id number] Date of Birth: 10-02-1951

## 2020-10-17 ENCOUNTER — Other Ambulatory Visit: Payer: Self-pay

## 2020-10-17 DIAGNOSIS — C119 Malignant neoplasm of nasopharynx, unspecified: Secondary | ICD-10-CM

## 2020-10-18 ENCOUNTER — Inpatient Hospital Stay (HOSPITAL_COMMUNITY)
Admission: EM | Admit: 2020-10-18 | Discharge: 2020-10-21 | DRG: 146 | Disposition: A | Discharge status: Hospice | Payer: Medicare HMO | Attending: Pulmonary Disease | Admitting: Pulmonary Disease

## 2020-10-18 DIAGNOSIS — J439 Emphysema, unspecified: Secondary | ICD-10-CM | POA: Diagnosis present

## 2020-10-18 DIAGNOSIS — C111 Malignant neoplasm of posterior wall of nasopharynx: Principal | ICD-10-CM | POA: Diagnosis present

## 2020-10-18 DIAGNOSIS — I70202 Unspecified atherosclerosis of native arteries of extremities, left leg: Secondary | ICD-10-CM | POA: Diagnosis present

## 2020-10-18 DIAGNOSIS — R578 Other shock: Secondary | ICD-10-CM | POA: Diagnosis present

## 2020-10-18 DIAGNOSIS — R1310 Dysphagia, unspecified: Secondary | ICD-10-CM | POA: Diagnosis present

## 2020-10-18 DIAGNOSIS — Z79899 Other long term (current) drug therapy: Secondary | ICD-10-CM

## 2020-10-18 DIAGNOSIS — Z20822 Contact with and (suspected) exposure to covid-19: Secondary | ICD-10-CM | POA: Diagnosis present

## 2020-10-18 DIAGNOSIS — H9190 Unspecified hearing loss, unspecified ear: Secondary | ICD-10-CM | POA: Diagnosis present

## 2020-10-18 DIAGNOSIS — Z8049 Family history of malignant neoplasm of other genital organs: Secondary | ICD-10-CM

## 2020-10-18 DIAGNOSIS — Z515 Encounter for palliative care: Secondary | ICD-10-CM

## 2020-10-18 DIAGNOSIS — Z85828 Personal history of other malignant neoplasm of skin: Secondary | ICD-10-CM

## 2020-10-18 DIAGNOSIS — Z87891 Personal history of nicotine dependence: Secondary | ICD-10-CM

## 2020-10-18 DIAGNOSIS — E781 Pure hyperglyceridemia: Secondary | ICD-10-CM | POA: Diagnosis not present

## 2020-10-18 DIAGNOSIS — R54 Age-related physical debility: Secondary | ICD-10-CM | POA: Diagnosis present

## 2020-10-18 DIAGNOSIS — Z95820 Peripheral vascular angioplasty status with implants and grafts: Secondary | ICD-10-CM

## 2020-10-18 DIAGNOSIS — E785 Hyperlipidemia, unspecified: Secondary | ICD-10-CM | POA: Diagnosis present

## 2020-10-18 DIAGNOSIS — Z66 Do not resuscitate: Secondary | ICD-10-CM | POA: Diagnosis not present

## 2020-10-18 DIAGNOSIS — I2510 Atherosclerotic heart disease of native coronary artery without angina pectoris: Secondary | ICD-10-CM | POA: Diagnosis present

## 2020-10-18 DIAGNOSIS — G9341 Metabolic encephalopathy: Secondary | ICD-10-CM | POA: Diagnosis present

## 2020-10-18 DIAGNOSIS — I10 Essential (primary) hypertension: Secondary | ICD-10-CM | POA: Diagnosis present

## 2020-10-18 DIAGNOSIS — Z85818 Personal history of malignant neoplasm of other sites of lip, oral cavity, and pharynx: Secondary | ICD-10-CM

## 2020-10-18 DIAGNOSIS — D62 Acute posthemorrhagic anemia: Secondary | ICD-10-CM | POA: Diagnosis present

## 2020-10-18 DIAGNOSIS — Z9221 Personal history of antineoplastic chemotherapy: Secondary | ICD-10-CM

## 2020-10-18 DIAGNOSIS — Z801 Family history of malignant neoplasm of trachea, bronchus and lung: Secondary | ICD-10-CM

## 2020-10-18 DIAGNOSIS — T17998A Other foreign object in respiratory tract, part unspecified causing other injury, initial encounter: Secondary | ICD-10-CM | POA: Diagnosis present

## 2020-10-18 DIAGNOSIS — R627 Adult failure to thrive: Secondary | ICD-10-CM | POA: Diagnosis present

## 2020-10-18 DIAGNOSIS — R041 Hemorrhage from throat: Secondary | ICD-10-CM | POA: Diagnosis present

## 2020-10-18 DIAGNOSIS — G2581 Restless legs syndrome: Secondary | ICD-10-CM | POA: Diagnosis present

## 2020-10-18 DIAGNOSIS — Z923 Personal history of irradiation: Secondary | ICD-10-CM

## 2020-10-18 DIAGNOSIS — G8929 Other chronic pain: Secondary | ICD-10-CM | POA: Diagnosis present

## 2020-10-18 DIAGNOSIS — E46 Unspecified protein-calorie malnutrition: Secondary | ICD-10-CM | POA: Diagnosis present

## 2020-10-18 DIAGNOSIS — J9601 Acute respiratory failure with hypoxia: Secondary | ICD-10-CM | POA: Diagnosis present

## 2020-10-18 NOTE — ED Triage Notes (Signed)
Pt [facility] coming from. States he woke up out of his sleep vomiting bright red blood. Pt has hx of esophageal cancer. Per ems about 200-300cc of blood in the bathroom at home. Pt not c/o abdominal pain/diarrhea. Last chemo tx was 2 weeks. Pt alert and oriented x4.

## 2020-10-19 ENCOUNTER — Encounter (HOSPITAL_COMMUNITY): Payer: Self-pay | Admitting: Pulmonary Disease

## 2020-10-19 ENCOUNTER — Other Ambulatory Visit: Payer: Self-pay

## 2020-10-19 ENCOUNTER — Inpatient Hospital Stay (HOSPITAL_COMMUNITY): Payer: Medicare HMO

## 2020-10-19 ENCOUNTER — Emergency Department (HOSPITAL_COMMUNITY): Payer: Medicare HMO

## 2020-10-19 DIAGNOSIS — Z66 Do not resuscitate: Secondary | ICD-10-CM | POA: Diagnosis not present

## 2020-10-19 DIAGNOSIS — Z95820 Peripheral vascular angioplasty status with implants and grafts: Secondary | ICD-10-CM | POA: Diagnosis not present

## 2020-10-19 DIAGNOSIS — C111 Malignant neoplasm of posterior wall of nasopharynx: Secondary | ICD-10-CM | POA: Diagnosis present

## 2020-10-19 DIAGNOSIS — Z20822 Contact with and (suspected) exposure to covid-19: Secondary | ICD-10-CM | POA: Diagnosis present

## 2020-10-19 DIAGNOSIS — D62 Acute posthemorrhagic anemia: Secondary | ICD-10-CM | POA: Diagnosis present

## 2020-10-19 DIAGNOSIS — Z515 Encounter for palliative care: Secondary | ICD-10-CM | POA: Diagnosis not present

## 2020-10-19 DIAGNOSIS — I10 Essential (primary) hypertension: Secondary | ICD-10-CM | POA: Diagnosis present

## 2020-10-19 DIAGNOSIS — G9341 Metabolic encephalopathy: Secondary | ICD-10-CM | POA: Diagnosis present

## 2020-10-19 DIAGNOSIS — R54 Age-related physical debility: Secondary | ICD-10-CM | POA: Diagnosis present

## 2020-10-19 DIAGNOSIS — H9190 Unspecified hearing loss, unspecified ear: Secondary | ICD-10-CM | POA: Diagnosis present

## 2020-10-19 DIAGNOSIS — E785 Hyperlipidemia, unspecified: Secondary | ICD-10-CM | POA: Diagnosis present

## 2020-10-19 DIAGNOSIS — R578 Other shock: Secondary | ICD-10-CM | POA: Diagnosis present

## 2020-10-19 DIAGNOSIS — E46 Unspecified protein-calorie malnutrition: Secondary | ICD-10-CM | POA: Diagnosis present

## 2020-10-19 DIAGNOSIS — R041 Hemorrhage from throat: Secondary | ICD-10-CM | POA: Diagnosis present

## 2020-10-19 DIAGNOSIS — R1310 Dysphagia, unspecified: Secondary | ICD-10-CM | POA: Diagnosis present

## 2020-10-19 DIAGNOSIS — Z85818 Personal history of malignant neoplasm of other sites of lip, oral cavity, and pharynx: Secondary | ICD-10-CM | POA: Diagnosis not present

## 2020-10-19 DIAGNOSIS — J9611 Chronic respiratory failure with hypoxia: Secondary | ICD-10-CM | POA: Diagnosis not present

## 2020-10-19 DIAGNOSIS — J439 Emphysema, unspecified: Secondary | ICD-10-CM | POA: Diagnosis present

## 2020-10-19 DIAGNOSIS — R627 Adult failure to thrive: Secondary | ICD-10-CM | POA: Diagnosis present

## 2020-10-19 DIAGNOSIS — T17998A Other foreign object in respiratory tract, part unspecified causing other injury, initial encounter: Secondary | ICD-10-CM | POA: Diagnosis present

## 2020-10-19 DIAGNOSIS — G2581 Restless legs syndrome: Secondary | ICD-10-CM | POA: Diagnosis present

## 2020-10-19 DIAGNOSIS — I70202 Unspecified atherosclerosis of native arteries of extremities, left leg: Secondary | ICD-10-CM | POA: Diagnosis present

## 2020-10-19 DIAGNOSIS — J9601 Acute respiratory failure with hypoxia: Secondary | ICD-10-CM | POA: Diagnosis present

## 2020-10-19 DIAGNOSIS — Z7189 Other specified counseling: Secondary | ICD-10-CM | POA: Diagnosis not present

## 2020-10-19 DIAGNOSIS — I2510 Atherosclerotic heart disease of native coronary artery without angina pectoris: Secondary | ICD-10-CM | POA: Diagnosis present

## 2020-10-19 DIAGNOSIS — G8929 Other chronic pain: Secondary | ICD-10-CM | POA: Diagnosis present

## 2020-10-19 LAB — BASIC METABOLIC PANEL
Anion gap: 14 (ref 5–15)
BUN: 23 mg/dL (ref 8–23)
CO2: 30 mmol/L (ref 22–32)
Calcium: 9.5 mg/dL (ref 8.9–10.3)
Chloride: 96 mmol/L — ABNORMAL LOW (ref 98–111)
Creatinine, Ser: 1.17 mg/dL (ref 0.61–1.24)
GFR, Estimated: >60 mL/min (ref >60)
Glucose, Bld: 80 mg/dL (ref 70–99)
Potassium: 3.4 mmol/L — ABNORMAL LOW (ref 3.5–5.1)
Sodium: 140 mmol/L (ref 135–145)

## 2020-10-19 LAB — APTT: aPTT: 32 seconds (ref 24–36)

## 2020-10-19 LAB — CBC WITH DIFFERENTIAL/PLATELET
Abs Immature Granulocytes: 0.04 10*3/uL (ref 0.00–0.07)
Basophils Absolute: 0.1 10*3/uL (ref 0.0–0.1)
Basophils Relative: 1 %
Eosinophils Absolute: 0.1 10*3/uL (ref 0.0–0.5)
Eosinophils Relative: 1 %
HCT: 30.6 % — ABNORMAL LOW (ref 39.0–52.0)
Hemoglobin: 9.3 g/dL — ABNORMAL LOW (ref 13.0–17.0)
Immature Granulocytes: 0 %
Lymphocytes Relative: 12 %
Lymphs Abs: 1.3 10*3/uL (ref 0.7–4.0)
MCH: 30.9 pg (ref 26.0–34.0)
MCHC: 30.4 g/dL (ref 30.0–36.0)
MCV: 101.7 fL — ABNORMAL HIGH (ref 80.0–100.0)
Monocytes Absolute: 1.2 10*3/uL — ABNORMAL HIGH (ref 0.1–1.0)
Monocytes Relative: 12 %
Neutro Abs: 7.6 10*3/uL (ref 1.7–7.7)
Neutrophils Relative %: 74 %
Platelets: 414 10*3/uL — ABNORMAL HIGH (ref 150–400)
RBC: 3.01 MIL/uL — ABNORMAL LOW (ref 4.22–5.81)
RDW: 14.4 % (ref 11.5–15.5)
WBC: 10.2 10*3/uL (ref 4.0–10.5)
nRBC: 0 % (ref 0.0–0.2)

## 2020-10-19 LAB — CBC
HCT: 47.4 % (ref 39.0–52.0)
Hemoglobin: 16.2 g/dL (ref 13.0–17.0)
MCH: 28.4 pg (ref 26.0–34.0)
MCHC: 34.2 g/dL (ref 30.0–36.0)
MCV: 83 fL (ref 80.0–100.0)
Platelets: 104 10*3/uL — ABNORMAL LOW (ref 150–400)
RBC: 5.71 MIL/uL (ref 4.22–5.81)
RDW: 16 % — ABNORMAL HIGH (ref 11.5–15.5)
WBC: 8.2 10*3/uL (ref 4.0–10.5)
nRBC: 0 % (ref 0.0–0.2)

## 2020-10-19 LAB — GLUCOSE, CAPILLARY
Glucose-Capillary: 194 mg/dL — ABNORMAL HIGH (ref 70–99)
Glucose-Capillary: 102 mg/dL — ABNORMAL HIGH (ref 70–99)

## 2020-10-19 LAB — HEMOGLOBIN AND HEMATOCRIT, BLOOD
HCT: 38.2 % — ABNORMAL LOW (ref 39.0–52.0)
Hemoglobin: 12.4 g/dL — ABNORMAL LOW (ref 13.0–17.0)

## 2020-10-19 LAB — I-STAT ARTERIAL BLOOD GAS, ED
Acid-Base Excess: 1 mmol/L (ref 0.0–2.0)
Bicarbonate: 27.2 mmol/L (ref 20.0–28.0)
Calcium, Ion: 1.09 mmol/L — ABNORMAL LOW (ref 1.15–1.40)
HCT: 26 % — ABNORMAL LOW (ref 39.0–52.0)
Hemoglobin: 8.8 g/dL — ABNORMAL LOW (ref 13.0–17.0)
O2 Saturation: 100 %
Patient temperature: 98.6
Potassium: 3.6 mmol/L (ref 3.5–5.1)
Sodium: 132 mmol/L — ABNORMAL LOW (ref 135–145)
TCO2: 29 mmol/L (ref 22–32)
pCO2 arterial: 54.2 mmHg — ABNORMAL HIGH (ref 32.0–48.0)
pH, Arterial: 7.309 — ABNORMAL LOW (ref 7.350–7.450)
pO2, Arterial: 344 mmHg — ABNORMAL HIGH (ref 83.0–108.0)

## 2020-10-19 LAB — COMPREHENSIVE METABOLIC PANEL
ALT: 12 U/L (ref 0–44)
AST: 20 U/L (ref 15–41)
Albumin: 2.7 g/dL — ABNORMAL LOW (ref 3.5–5.0)
Alkaline Phosphatase: 91 U/L (ref 38–126)
Anion gap: 17 — ABNORMAL HIGH (ref 5–15)
BUN: 24 mg/dL — ABNORMAL HIGH (ref 8–23)
CO2: 24 mmol/L (ref 22–32)
Calcium: 9.1 mg/dL (ref 8.9–10.3)
Chloride: 93 mmol/L — ABNORMAL LOW (ref 98–111)
Creatinine, Ser: 1.4 mg/dL — ABNORMAL HIGH (ref 0.61–1.24)
GFR, Estimated: 54 mL/min — ABNORMAL LOW (ref >60)
Glucose, Bld: 204 mg/dL — ABNORMAL HIGH (ref 70–99)
Potassium: 4.1 mmol/L (ref 3.5–5.1)
Sodium: 134 mmol/L — ABNORMAL LOW (ref 135–145)
Total Bilirubin: 0.8 mg/dL (ref 0.3–1.2)
Total Protein: 6.1 g/dL — ABNORMAL LOW (ref 6.5–8.1)

## 2020-10-19 LAB — RESPIRATORY PANEL BY RT PCR (FLU A&B, COVID)
Influenza A by PCR: NEGATIVE
Influenza B by PCR: NEGATIVE
SARS Coronavirus 2 by RT PCR: NEGATIVE

## 2020-10-19 LAB — DIC (DISSEMINATED INTRAVASCULAR COAGULATION)PANEL
D-Dimer, Quant: 0.76 ug/mL-FEU — ABNORMAL HIGH (ref 0.00–0.50)
Fibrinogen: 271 mg/dL (ref 210–475)
INR: 1.5 — ABNORMAL HIGH (ref 0.8–1.2)
Platelets: 137 10*3/uL — ABNORMAL LOW (ref 150–400)
Prothrombin Time: 17.6 seconds — ABNORMAL HIGH (ref 11.4–15.2)
Smear Review: NONE SEEN
aPTT: 33 seconds (ref 24–36)

## 2020-10-19 LAB — PROTIME-INR
INR: 1.2 (ref 0.8–1.2)
INR: 1.2 (ref 0.8–1.2)
Prothrombin Time: 14.3 seconds (ref 11.4–15.2)
Prothrombin Time: 15.1 seconds (ref 11.4–15.2)

## 2020-10-19 LAB — TRIGLYCERIDES: Triglycerides: 139 mg/dL (ref <150)

## 2020-10-19 LAB — PHOSPHORUS: Phosphorus: 4.3 mg/dL (ref 2.5–4.6)

## 2020-10-19 LAB — MRSA PCR SCREENING: MRSA by PCR: NEGATIVE

## 2020-10-19 LAB — ABO/RH: ABO/RH(D): O POS

## 2020-10-19 LAB — MAGNESIUM: Magnesium: 2.1 mg/dL (ref 1.7–2.4)

## 2020-10-19 LAB — MASSIVE TRANSFUSION PROTOCOL ORDER (BLOOD BANK NOTIFICATION)

## 2020-10-19 MED ORDER — FENTANYL CITRATE (PF) 100 MCG/2ML IJ SOLN
INTRAMUSCULAR | Status: AC | PRN
Start: 2020-10-19 — End: 2020-10-19
  Administered 2020-10-19: 50 ug via INTRAVENOUS

## 2020-10-19 MED ORDER — MIDAZOLAM HCL 2 MG/2ML IJ SOLN
INTRAMUSCULAR | Status: AC
  Filled 2020-10-19: qty 2

## 2020-10-19 MED ORDER — ETOMIDATE 2 MG/ML IV SOLN
INTRAVENOUS | Status: AC | PRN
  Administered 2020-10-19: 20 mg via INTRAVENOUS

## 2020-10-19 MED ORDER — POTASSIUM CHLORIDE 10 MEQ/100ML IV SOLN
10.0000 meq | INTRAVENOUS | Status: AC
  Administered 2020-10-19 (×2): 10 meq via INTRAVENOUS
  Filled 2020-10-19 (×2): qty 100

## 2020-10-19 MED ORDER — PROPOFOL 1000 MG/100ML IV EMUL
INTRAVENOUS | Status: AC | PRN
  Administered 2020-10-19: 10 ug/kg/min via INTRAVENOUS

## 2020-10-19 MED ORDER — ROCURONIUM BROMIDE 50 MG/5ML IV SOLN
INTRAVENOUS | Status: AC | PRN
  Administered 2020-10-19: 100 mg via INTRAVENOUS

## 2020-10-19 MED ORDER — ETOMIDATE 2 MG/ML IV SOLN
INTRAVENOUS | Status: AC
  Filled 2020-10-19: qty 10

## 2020-10-19 MED ORDER — CALCIUM CHLORIDE 10 % IV SOLN
INTRAVENOUS | Status: AC | PRN
  Administered 2020-10-19: 1 g via INTRAVENOUS

## 2020-10-19 MED ORDER — TRANEXAMIC ACID-NACL 1000-0.7 MG/100ML-% IV SOLN
1000.0000 mg | Freq: Once | INTRAVENOUS | Status: AC
  Administered 2020-10-19: 1000 mg via INTRAVENOUS

## 2020-10-19 MED ORDER — MIDAZOLAM HCL 2 MG/2ML IJ SOLN: 2.0000 mg | Freq: Once | INTRAMUSCULAR | Status: AC

## 2020-10-19 MED ORDER — FENTANYL CITRATE (PF) 100 MCG/2ML IJ SOLN
INTRAMUSCULAR | Status: AC
  Filled 2020-10-19: qty 2

## 2020-10-19 MED ORDER — NOREPINEPHRINE 4 MG/250ML-% IV SOLN
INTRAVENOUS | Status: DC | PRN
  Administered 2020-10-19: 10 ug/min via INTRAVENOUS

## 2020-10-19 MED ORDER — EPINEPHRINE 1 MG/10ML IJ SOSY
PREFILLED_SYRINGE | INTRAMUSCULAR | Status: AC | PRN
  Administered 2020-10-19: 1 mg via INTRAVENOUS

## 2020-10-19 MED ORDER — FENTANYL CITRATE (PF) 100 MCG/2ML IJ SOLN
25.0000 ug | INTRAMUSCULAR | Status: DC | PRN
  Administered 2020-10-19: 25 ug via INTRAVENOUS
  Administered 2020-10-20 (×2): 100 ug via INTRAVENOUS
  Filled 2020-10-19: qty 2

## 2020-10-19 MED ORDER — MIDAZOLAM HCL 2 MG/2ML IJ SOLN
INTRAMUSCULAR | Status: AC
  Administered 2020-10-19: 2 mg via INTRAVENOUS
  Filled 2020-10-19: qty 2

## 2020-10-19 MED ORDER — FAMOTIDINE IN NACL 20-0.9 MG/50ML-% IV SOLN
20.0000 mg | Freq: Every day | INTRAVENOUS | Status: DC
  Administered 2020-10-19 – 2020-10-21 (×3): 20 mg via INTRAVENOUS
  Filled 2020-10-19 (×3): qty 50

## 2020-10-19 MED ORDER — CHLORHEXIDINE GLUCONATE CLOTH 2 % EX PADS
6.0000 | MEDICATED_PAD | Freq: Every day | CUTANEOUS | Status: DC
  Administered 2020-10-19 – 2020-10-20 (×2): 6 via TOPICAL

## 2020-10-19 MED ORDER — SODIUM CHLORIDE 0.9% FLUSH
10.0000 mL | Freq: Two times a day (BID) | INTRAVENOUS | Status: DC
  Administered 2020-10-19 (×2): 10 mL

## 2020-10-19 MED ORDER — SODIUM CHLORIDE 0.9% FLUSH: 10.0000 mL | INTRAVENOUS | Status: DC | PRN

## 2020-10-19 MED ORDER — ORAL CARE MOUTH RINSE
15.0000 mL | OROMUCOSAL | Status: DC
  Administered 2020-10-19: 15 mL via OROMUCOSAL

## 2020-10-19 MED ORDER — CHLORHEXIDINE GLUCONATE CLOTH 2 % EX PADS: 6.0000 | MEDICATED_PAD | Freq: Every day | CUTANEOUS | Status: DC

## 2020-10-19 MED ORDER — IOHEXOL 350 MG/ML SOLN
75.0000 mL | Freq: Once | INTRAVENOUS | Status: AC | PRN
  Administered 2020-10-19: 75 mL via INTRAVENOUS

## 2020-10-19 MED ORDER — ROCURONIUM BROMIDE 10 MG/ML (PF) SYRINGE
PREFILLED_SYRINGE | INTRAVENOUS | Status: AC
  Filled 2020-10-19: qty 10

## 2020-10-19 MED ORDER — ORAL CARE MOUTH RINSE
15.0000 mL | OROMUCOSAL | Status: DC
  Administered 2020-10-19 – 2020-10-21 (×14): 15 mL via OROMUCOSAL

## 2020-10-19 MED ORDER — PROPOFOL 1000 MG/100ML IV EMUL
INTRAVENOUS | Status: AC
  Filled 2020-10-19: qty 100

## 2020-10-19 MED ORDER — MIDAZOLAM HCL 2 MG/2ML IJ SOLN: 1.0000 mg | INTRAMUSCULAR | Status: DC | PRN

## 2020-10-19 MED ORDER — NOREPINEPHRINE 4 MG/250ML-% IV SOLN: 0.0000 ug/min | INTRAVENOUS | Status: DC

## 2020-10-19 MED ORDER — PROPOFOL 1000 MG/100ML IV EMUL
5.0000 ug/kg/min | INTRAVENOUS | Status: DC
  Administered 2020-10-19: 30 ug/kg/min via INTRAVENOUS
  Administered 2020-10-19 – 2020-10-20 (×2): 35 ug/kg/min via INTRAVENOUS
  Filled 2020-10-19 (×3): qty 100

## 2020-10-19 MED ORDER — CHLORHEXIDINE GLUCONATE 0.12% ORAL RINSE (MEDLINE KIT): 15.0000 mL | Freq: Two times a day (BID) | OROMUCOSAL | Status: DC

## 2020-10-19 MED ORDER — HYDRALAZINE HCL 20 MG/ML IJ SOLN
10.0000 mg | INTRAMUSCULAR | Status: DC | PRN
  Administered 2020-10-19: 10 mg via INTRAVENOUS
  Filled 2020-10-19: qty 1

## 2020-10-19 MED ORDER — CHLORHEXIDINE GLUCONATE 0.12% ORAL RINSE (MEDLINE KIT)
15.0000 mL | Freq: Two times a day (BID) | OROMUCOSAL | Status: DC
  Administered 2020-10-19 – 2020-10-21 (×4): 15 mL via OROMUCOSAL

## 2020-10-19 MED ORDER — MIDAZOLAM HCL 5 MG/5ML IJ SOLN
INTRAMUSCULAR | Status: AC | PRN
  Administered 2020-10-19: 2 mg via INTRAVENOUS

## 2020-10-19 MED ORDER — LACTATED RINGERS IV BOLUS: 1000.0000 mL | Freq: Once | INTRAVENOUS | Status: DC

## 2020-10-19 NOTE — Progress Notes (Signed)
Patient Progress Updates:  Willie Kennedy is a 69 y.o. 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  He is currently on supportive care only for his cancer. I discussed his case with Dr. Isidore Moos from radiation oncology as well as Dr. Julien Nordmann on call for oncology. No additional role for chemotherapy or radiation at this time. No active extravasation of blood vessels from the CTNeck angio.  Full support on vent today, monitor for signs of bleeding, correct coagulopathy as needed. Consider additional imaging tomorrow and discuss further with Dr. Hans Eden.   Additional critical care time 45 minutes.  Lenice Llamas, MD Pulmonary and [place] Pager: [phone]

## 2020-10-19 NOTE — ED Notes (Signed)
Patient vomiting blood uncontrollably. Patient filling up a blue emesis bag with blood. Provider made aware. This RN attempting to get access on patient while tech suctioning.

## 2020-10-19 NOTE — Progress Notes (Signed)
Provided active listening and emotional support to wife and BIL.    Luana Shu [phone]     10/19/20 0100  Clinical Encounter Type  Visited With Family  Visit Type Initial;Critical Care  Referral From Nurse  Consult/Referral To Chaplain  Spiritual Encounters  Spiritual Needs Emotional  Stress Factors  Patient Stress Factors Health changes  Family Stress Factors Lack of knowledge

## 2020-10-19 NOTE — Consult Note (Signed)
Reason for Consult:Pharyngeal bleeding Referring Physician: Merrily Pew, MD  Willie Kennedy is an 69 y.o. male.  HPI: History of right sided nasopharyngeal carcinoma, recently completed Chemo/XRT. He had a ventilation tube placed on the right side in July when he had biopsies. According to his wife, he has been bleeding intermittently since the biopsy. He had a very large bleeding episode this evening and was brought to the ED. He was hypotensive and bleeding heavily so was orally intubated in the ED. He has received 2 1/2 Units PRBC thus far.   Past Medical History:  Diagnosis Date  . Biceps tendon rupture    h/o Right biceps tendon rupture  . CAD (coronary artery disease)    a. ETT myoview (10/11) with EF 52%, reversible inferior perfusion defect, 1 mm inferolateral ST depression with exercise, 7"16" and stopped due to dyspnea and leg weakness -> LHC (10/11), EF 55%, 40% pRCA, 40% mRCA, 40% prox to mid CFX. medical treatment.  . Cancer ([facility])    skin cancer behind left ear   . Dizziness    with bending over   . Hard of hearing    more per right ear  . Headache    with bright light   . History of bronchitis   . History of hiatal hernia   . Hyperlipidemia   . Impaired glucose tolerance   . Numbness and tingling in left arm    if holds arm upward too long pt states his primary MD is aware  . Osteoarthritis   . Peripheral vascular disease ([facility]) 2005   a. s/p left fem-pop Dr.Lawson in 2005.  Marland Kitchen Shortness of breath dyspnea    walking distances or climbing stairs  . Tinnitus     Past Surgical History:  Procedure Laterality Date  . ds/p left femoral artery occlusion  2005   s/p left fem-pop bypass- Dr. Kellie Simmering  . HERNIA REPAIR    . IR GASTROSTOMY TUBE MOD SED  07/24/2020  . IR IMAGING GUIDED PORT INSERTION  07/24/2020  . MULTIPLE EXTRACTIONS WITH ALVEOLOPLASTY N/A 07/17/2020   Procedure: Extraction of tooth #'s 9,14,78,29,FAO 31 with alveoloplasty;  Surgeon: Lenn Cal, DDS;   Location: [facility];  Service: Oral Surgery;  Laterality: N/A;  . MYRINGOTOMY WITH TUBE PLACEMENT Right 06/26/2020   Procedure: MYRINGOTOMY WITH TUBE PLACEMENT RIGHT  EAR;  Surgeon: Melida Quitter, MD;  Location: [facility];  Service: ENT;  Laterality: Right;  . NASAL ENDOSCOPY N/A 06/26/2020   Procedure: NASAL ENDOSCOPY W/NASOPHARYNGEAL BIOPSY;  Surgeon: Melida Quitter, MD;  Location: [facility];  Service: ENT;  Laterality: N/A;  . PR VEIN BYPASS GRAFT,AORTO-FEM-POP    . s/p right bicep tendon rupture repair    . VENTRAL HERNIA REPAIR N/A 08/22/2015   Procedure: OPEN VENTRAL HERNIA REPAIR WITH MESH;  Surgeon: Jackolyn Confer, MD;  [facility];  Service: General;  Laterality: N/A;    Family History  Problem Relation Age of Onset  . Cervical cancer Mother   . Cancer Mother        cervical  . Lung cancer Father   . Cancer Father        lung    Social History:  reports that he quit smoking about 25 years ago. His smoking use included cigarettes. He has a 280.00 pack-year smoking history. He has never used smokeless tobacco. He reports that he does not drink alcohol and does not use drugs.  Allergies: No Known Allergies  Medications: Reviewed  Results for orders placed or performed during the hospital encounter of 10/18/20 (from the past 48 hour(s))  Type and screen     Status: None (Preliminary result)   Collection Time: 10/19/20 12:08 AM  Result Value Ref Range   ABO/RH(D) O POS    Antibody Screen PENDING    Sample Expiration 10/22/2020,2359    Unit Number [id number]    Blood Component Type RED CELLS,LR    Unit division 00    Status of Unit ISSUED    Unit tag comment EMERGENCY RELEASE    Transfusion Status OK TO TRANSFUSE    Crossmatch Result PENDING    Unit Number [id number]    Blood Component Type RED CELLS,LR    Unit division 00    Status of Unit ISSUED    Unit tag comment EMERGENCY RELEASE    Transfusion Status OK TO TRANSFUSE     Crossmatch Result PENDING    Unit Number [id number]    Blood Component Type RED CELLS,LR    Unit division 00    Status of Unit ISSUED    Unit tag comment EMERGENCY RELEASE    Transfusion Status OK TO TRANSFUSE    Crossmatch Result PENDING    Unit Number [id number]    Blood Component Type RED CELLS,LR    Unit division 00    Status of Unit ISSUED    Unit tag comment EMERGENCY RELEASE    Transfusion Status OK TO TRANSFUSE    Crossmatch Result PENDING    Unit Number [id number]    Blood Component Type RED CELLS,LR    Unit division 00    Status of Unit ISSUED    Unit tag comment EMERGENCY RELEASE    Transfusion Status      OK TO TRANSFUSE Performed at [facility], [address], [place], [place] [address]    Crossmatch Result PENDING    Unit Number [id number]    Blood Component Type RED CELLS,LR    Unit division 00    Status of Unit ISSUED    Unit tag comment EMERGENCY RELEASE    Transfusion Status OK TO TRANSFUSE    Crossmatch Result PENDING   CBC with Differential     Status: Abnormal   Collection Time: 10/19/20 12:22 AM  Result Value Ref Range   WBC 10.2 4.0 - 10.5 K/uL   RBC 3.01 (L) 4.22 - 5.81 MIL/uL   Hemoglobin 9.3 (L) 13.0 - 17.0 g/dL   HCT 30.6 (L) 39 - 52 %   MCV 101.7 (H) 80.0 - 100.0 fL   MCH 30.9 26.0 - 34.0 pg   MCHC 30.4 30.0 - 36.0 g/dL   RDW 14.4 11.5 - 15.5 %   Platelets 414 (H) 150 - 400 K/uL   nRBC 0.0 0.0 - 0.2 %   Neutrophils Relative % 74 %   Neutro Abs 7.6 1.7 - 7.7 K/uL   Lymphocytes Relative 12 %   Lymphs Abs 1.3 0.7 - 4.0 K/uL   Monocytes Relative 12 %   Monocytes Absolute 1.2 (H) 0.1 - 1.0 K/uL   Eosinophils Relative 1 %   Eosinophils Absolute 0.1 0.0 - 0.5 K/uL   Basophils Relative 1 %   Basophils Absolute 0.1 0.0 - 0.1 K/uL   Immature Granulocytes 0 %   Abs Immature Granulocytes 0.04 0.00 - 0.07 K/uL    Comment: Performed at [facility], [address] [place], [place] [address]  Comprehensive metabolic  panel     Status: Abnormal  Collection Time: 10/19/20 12:22 AM  Result Value Ref Range   Sodium 134 (L) 135 - 145 mmol/L   Potassium 4.1 3.5 - 5.1 mmol/L   Chloride 93 (L) 98 - 111 mmol/L   CO2 24 22 - 32 mmol/L   Glucose, Bld 204 (H) 70 - 99 mg/dL    Comment: Glucose reference range applies only to samples taken after fasting for at least 8 hours.   BUN 24 (H) 8 - 23 mg/dL   Creatinine, Ser 1.40 (H) 0.61 - 1.24 mg/dL   Calcium 9.1 8.9 - 10.3 mg/dL   Total Protein 6.1 (L) 6.5 - 8.1 g/dL   Albumin 2.7 (L) 3.5 - 5.0 g/dL   AST 20 15 - 41 U/L   ALT 12 0 - 44 U/L   Alkaline Phosphatase 91 38 - 126 U/L   Total Bilirubin 0.8 0.3 - 1.2 mg/dL   GFR, Estimated 54 (L) >60 mL/min    Comment: (NOTE) Calculated using the CKD-EPI Creatinine Equation (2021)    Anion gap 17 (H) 5 - 15    Comment: Performed at [facility], [place] [address] [place], [place] [address]  Protime-INR     Status: None   Collection Time: 10/19/20 12:22 AM  Result Value Ref Range   Prothrombin Time 14.3 11.4 - 15.2 seconds   INR 1.2 0.8 - 1.2    Comment: (NOTE) INR goal varies based on device and disease states. Performed at [facility], [place] [address], [place], [place] [address]   Prepare fresh frozen plasma     Status: None (Preliminary result)   Collection Time: 10/19/20 12:59 AM  Result Value Ref Range   Unit Number [id number]    Blood Component Type THW PLS APHR    Unit division A0    Status of Unit ISSUED    Unit tag comment EMERGENCY RELEASE    Transfusion Status OK TO TRANSFUSE    Unit Number [id number]    Blood Component Type THAWED PLASMA    Unit division 00    Status of Unit ISSUED    Unit tag comment EMERGENCY RELEASE    Transfusion Status OK TO TRANSFUSE    Unit Number [id number]    Blood Component Type THAWED PLASMA    Unit division 00    Status of Unit ISSUED    Unit tag comment EMERGENCY RELEASE    Transfusion Status OK TO TRANSFUSE    Unit Number [id number]     Blood Component Type THAWED PLASMA    Unit division 00    Status of Unit ISSUED    Unit tag comment EMERGENCY RELEASE    Transfusion Status OK TO TRANSFUSE    Unit Number [id number]    Blood Component Type THAWED PLASMA    Unit division 00    Status of Unit ISSUED    Unit tag comment EMERGENCY RELEASE    Transfusion Status      OK TO TRANSFUSE Performed at [facility], [place] [address] [place], [place] [address]    Unit Number [id number]    Blood Component Type THAWED PLASMA    Unit division 00    Status of Unit ISSUED    Unit tag comment EMERGENCY RELEASE    Transfusion Status OK TO TRANSFUSE   I-Stat arterial blood gas, ED     Status: Abnormal   Collection Time: 10/19/20  1:12 AM  Result Value Ref Range   pH, Arterial 7.309 (L) 7.35 -  7.45   pCO2 arterial 54.2 (H) 32 - 48 mmHg   pO2, Arterial 344 (H) 83 - 108 mmHg   Bicarbonate 27.2 20.0 - 28.0 mmol/L   TCO2 29 22 - 32 mmol/L   O2 Saturation 100.0 %   Acid-Base Excess 1.0 0.0 - 2.0 mmol/L   Sodium 132 (L) 135 - 145 mmol/L   Potassium 3.6 3.5 - 5.1 mmol/L   Calcium, Ion 1.09 (L) 1.15 - 1.40 mmol/L   HCT 26.0 (L) 39 - 52 %   Hemoglobin 8.8 (L) 13.0 - 17.0 g/dL   Patient temperature 98.6 F    Collection site Radial    Drawn by RT    Sample type ARTERIAL     No results found.  BXU:XYBFXOVA except as listed in admit H&P  Blood pressure 101/82, pulse (!) 120, resp. rate 17, SpO2 93 %.  PHYSICAL EXAM: Overall appearance:  Intubated and sedated on ventilator Head:  Normocephalic, atraumatic. Ears: External ears look healthy. Nose: External nose is healthy in appearance. Internal nasal exam free of any lesions or obstruction. Oral Cavity/Pharynx:  There is blood pooled in the oropharynx. Larynx/Hypopharynx: Deferred Neuro:  Sedated on ventilator. Neck: No palpable neck masses.  Studies Reviewed: CT neck from July - there is a large right sided nasopharyngeal mass that surounds the carotid at the skull  base which is also eroded.  Procedures: none   Assessment/Plan: Phayrngeal bleed secondary to nasopharyngeal carcinoma. Recommend Interventional Radiology evaluation to see if they might be able to perform angiography and embolization. Continue with ventilatory support and blood/fluid replacement.   Izora Gala 10/19/2020, 1:42 AM

## 2020-10-19 NOTE — H&P (Addendum)
[name], MRN:  [id number], DOB:  1951/07/31, LOS: 0 ADMISSION DATE:  10/18/2020, CONSULTATION DATE:  10/19/20 REFERRING MD:  Dr. 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  He required massive transfusion and intubation.  Hx from chart, medical team and family.  Past Medical History  Tinnitus, OA, HLD, Hiatal hernia, Headache, CAD, PAD  Significant Hospital Events   11/21 admit  Consults:  ENT  Procedures:  ETT 11/21 >>   Significant Diagnostic Tests:  CT angio neck 11/21 >>   Micro Data:  COVID/Flu 11/21 >> negative  Antimicrobials:     Interim history/subjective:    Objective   Blood pressure (!) 172/96, pulse 93, temperature 97.7 F (36.5 C), temperature source Oral, resp. rate 17, SpO2 98 %.    Vent Mode: PRVC FiO2 (%):  [100 %] 100 % Set Rate:  [20 bmp] 20 bmp Vt Set:  [500 mL] 500 mL PEEP:  [5 cmH20] 5 cmH20 Plateau Pressure:  [14 cmH20] 14 cmH20  No intake or output data in the 24 hours ending 10/19/20 0256 There were no vitals filed for this visit.  Examination:  General - seen in ER Eyes - pupils reactive ENT - ETT in place, nasal backing in nares Cardiac - regular rate/rhythm, no murmur Chest - equal breath sounds b/l, no wheezing or rales, port Rt upper chest Abdomen - soft, non tender, decreased bowel sounds, G tube in place Extremities - decreased muscle bulk Skin - no rashes Neuro - not following commands   Resolved Hospital Problem list     Assessment & Plan:   Hemorrhagic shock s/p massive transfusion likely from nasopharyngeal cancer possible eroding in blood vessel. - bleeding controlled in ER after receiving tranexamic acid - ENT consulted >> unlikely any surgical options - for CT angio neck >>  based on results will determine if IR needed for embolization of vessel - f/u CBC, INR, PTT, electrolytes  Acute hypoxic respiratory failure with compromised airway. Advanced emphysema. - full vent support - f/u CXR, ABG  Stage 3 SCC of Rt nasopharynx. - followed by Dr. Alen Kennedy and Dr. Isidore Kennedy - completed chemoradiation therapy   Acute metabolic encephalopathy 2nd to shock. Chronic pain. - RASS goal 0 to -1 - hold outpt neurontin, oxycodone  Goals of care. - spoke with pt's wife and brother at bedside - discussed that he has life threatening bleeding and if it is related to erosion of cancer into major vessel, then there might not be additional treatment options - will continue to have goals of care discussion based on clinical findings and test results   Best practice:  Diet: NPO DVT prophylaxis: SCD GI prophylaxis: Pepcid Mobility: bed rest Code Status: full code Disposition: ICU  Labs   CBC: Recent Labs  Lab 10/19/20 0022 10/19/20 0112 10/19/20 0146  WBC 10.2  --   --   NEUTROABS 7.6  --   --   HGB 9.3* 8.8*  --   HCT 30.6* 26.0*  --   MCV 101.7*  --   --   PLT 414*  --  137*    Basic Metabolic Panel: Recent Labs  Lab 10/19/20 0022 10/19/20 0112  NA 134* 132*  K 4.1 3.6  CL 93*  --  CO2 24  --   GLUCOSE 204*  --   BUN 24*  --   CREATININE 1.40*  --   CALCIUM 9.1  --    GFR: Estimated Creatinine Clearance: 33.6 mL/min (A) (by C-G formula based on SCr of 1.4 mg/dL (H)). Recent Labs  Lab 10/19/20 0022  WBC 10.2    Liver Function Tests: Recent Labs  Lab 10/19/20 0022  AST 20  ALT 12  ALKPHOS 91  BILITOT 0.8  PROT 6.1*  ALBUMIN 2.7*   No results for input(s): LIPASE, AMYLASE in the last 168 hours. No results for input(s): AMMONIA in the last 168 hours.  ABG    Component Value Date/Time   PHART 7.309 (L) 10/19/2020 0112   PCO2ART 54.2 (H) 10/19/2020 0112   PO2ART 344 (H) 10/19/2020 0112   HCO3 27.2 10/19/2020 0112   TCO2 29  10/19/2020 0112   O2SAT 100.0 10/19/2020 0112     Coagulation Profile: Recent Labs  Lab 10/19/20 0022 10/19/20 0146  INR 1.2 1.5*    Cardiac Enzymes: No results for input(s): CKTOTAL, CKMB, CKMBINDEX, TROPONINI in the last 168 hours.  HbA1C: No results found for: HGBA1C  CBG: No results for input(s): GLUCAP in the last 168 hours.  Review of Systems:   Unable to obtain  Past Medical History  He,  has a past medical history of Biceps tendon rupture, CAD (coronary artery disease), Cancer ([place]), Dizziness, Hard of hearing, Headache, History of bronchitis, History of hiatal hernia, Hyperlipidemia, Impaired glucose tolerance, Numbness and tingling in left arm, Osteoarthritis, Peripheral vascular disease ([place]) (2005), Shortness of breath dyspnea, and Tinnitus.   Surgical History    Past Surgical History:  Procedure Laterality Date  . ds/p left femoral artery occlusion  2005   s/p left fem-pop bypass- Dr. Kellie Kennedy  . HERNIA REPAIR    . IR GASTROSTOMY TUBE MOD SED  07/24/2020  . IR IMAGING GUIDED PORT INSERTION  07/24/2020  . MULTIPLE EXTRACTIONS WITH ALVEOLOPLASTY N/A 07/17/2020   Procedure: Extraction of tooth #'s 6,55,37,48,OLM 31 with alveoloplasty;  Surgeon: Willie Kennedy, DDS;  Location: [facility];  Service: Oral Surgery;  Laterality: N/A;  . MYRINGOTOMY WITH TUBE PLACEMENT Right 06/26/2020   Procedure: MYRINGOTOMY WITH TUBE PLACEMENT RIGHT  EAR;  Surgeon: Willie Quitter, MD;  Location: [facility];  Service: ENT;  Laterality: Right;  . NASAL ENDOSCOPY N/A 06/26/2020   Procedure: NASAL ENDOSCOPY W/NASOPHARYNGEAL BIOPSY;  Surgeon: Willie Quitter, MD;  Location: [facility];  Service: ENT;  Laterality: N/A;  . PR VEIN BYPASS GRAFT,AORTO-FEM-POP    . s/p right bicep tendon rupture repair    . VENTRAL HERNIA REPAIR N/A 08/22/2015   Procedure: OPEN VENTRAL HERNIA REPAIR WITH MESH;  Surgeon: Willie Confer, MD;  Location: [facility];  Service: General;  Laterality:  N/A;     Social History   reports that he quit smoking about 25 years ago. His smoking use included cigarettes. He has a 280.00 pack-year smoking history. He has never used smokeless tobacco. He reports that he does not drink alcohol and does not use drugs.   Family History   His family history includes Cancer in his father and mother; Cervical cancer in his mother; Lung cancer in his father.   Allergies No Known Allergies   Home Medications  Prior to Admission medications   Medication Sig Start Date End Date Taking? Authorizing Provider  amoxicillin-clavulanate (AUGMENTIN) 125-31.25 MG/5ML suspension Take 5 mLs (125 mg total) by mouth 3 (three)  times daily. 09/01/20   Wyatt Portela, MD  gabapentin (NEURONTIN) 250 MG/5ML solution Take 6 mLs (300 mg total) by mouth 3 (three) times daily. May take by PEG tube. 09/15/20   Eppie Gibson, MD  lansoprazole (PREVACID SOLUTAB) 15 MG disintegrating tablet Dissolve one tablet in mouth daily.  (Affordable alternative to previous liquid Rx for Omeprazole). 08/05/20   Eppie Gibson, MD  lidocaine (XYLOCAINE) 2 % solution Patient: Mix 1part 2% viscous lidocaine, 1part H20. Swish & swallow 41mL of diluted mixture, 29min before meals and at bedtime, up to QID 08/11/20   Eppie Gibson, MD  lidocaine-prilocaine (EMLA) cream Apply 1 application topically as needed. 09/18/20   Tanner, Lyndon Code., PA-C  metoCLOPramide (REGLAN) 5 MG/5ML solution Place 5 mLs (5 mg total) into feeding tube 3 (three) times daily before meals. 09/30/20   Wyatt Portela, MD  nitroGLYCERIN (NITROSTAT) 0.4 MG SL tablet Place 1 tablet (0.4 mg total) under the tongue every 5 (five) minutes as needed. Place 1 tablet under tongue for chest pain -- not to exceed 3 in 15 minutes 05/04/13   Biagio Borg, MD  Nutritional Supplements (FEEDING SUPPLEMENT, OSMOLITE 1.5 Kennedy,) LIQD Give 1-1/2 cartons Osmolite 1.5 via PEG 4 times daily with 60 mL free water before and 120 mL free water after each bolus  feeding.  This provides 2130 Kennedy, 89.4 g protein, 1806 mL free water/100% estimated nutrition needs.  Please send formula and supplies. 09/02/20   Eppie Gibson, MD  ondansetron [facility]) 4 MG/5ML solution Take 7.5 mLs (6 mg total) by mouth every 8 (eight) hours as needed for nausea or vomiting. Okay to take by tube, if preferred. Take around the clock, 3 x's daily, if nausea is severe. 09/29/20   Tanner, Lyndon Code., PA-C  oxyCODONE (ROXICODONE) 5 MG/5ML solution Place 7.5 mLs (7.5 mg total) into feeding tube every 4 (four) hours as needed for severe pain (Take with food.). 10/10/20   Eppie Gibson, MD  promethazine (PHENERGAN) 6.25 MG/5ML syrup 6.25 to 12.5 mg Q 6 hours VT prn nausea 09/30/20   Wyatt Portela, MD     Critical care time: 48 minutes  Chesley Mires, MD [place] Pager - [phone] 10/19/2020, 3:13 AM

## 2020-10-19 NOTE — ED Provider Notes (Signed)
[facility]ICAL ICU Provider Note   CSN: [id number] Arrival date & time: 10/18/20  2342     History Chief Complaint  Patient presents with  . Hematemesis    Willie Kennedy is a 69 y.o. male.  69 year old male who is able to give limited history secondary to acuity of the situation.  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  EMS was called had a soft blood pressure brought here for further evaluation.  History is obtained from EMS, patient and wife when she arrived.        Past Medical History:  Diagnosis Date  . Biceps tendon rupture    h/o Right biceps tendon rupture  . CAD (coronary artery disease)    a. ETT myoview (10/11) with EF 52%, reversible inferior perfusion defect, 1 mm inferolateral ST depression with exercise, 7"16" and stopped due to dyspnea and leg weakness -> LHC (10/11), EF 55%, 40% pRCA, 40% mRCA, 40% prox to mid CFX. medical treatment.  . Cancer ([place])    skin cancer behind left ear   . Dizziness    with bending over   . Hard of hearing    more per right ear  . Headache    with bright light   . History of bronchitis   . History of hiatal hernia   . Hyperlipidemia   . Impaired glucose tolerance   . Numbness and tingling in left arm    if holds arm upward too long pt states his primary MD is aware  . Osteoarthritis   . Peripheral vascular disease ([place]) 2005   a. s/p left fem-pop Dr.Lawson in 2005.  Marland Kitchen Shortness of breath dyspnea    walking distances or climbing stairs  . Tinnitus     Patient Active Problem List   Diagnosis Date Noted  . Hemorrhagic shock ([place]) 10/19/2020  . Carcinoma of posterior wall of nasopharynx ([place]) 07/18/2020  . Nasopharyngeal cancer ([place]) 07/08/2020  . Neuropathy 07/11/2019  . DJD (degenerative  joint disease) 07/11/2019  . Decreased ROM of left elbow 10/03/2017  . Pain in left wrist 09/26/2017  . Biceps rupture, distal, left, initial encounter 06/27/2017  . Neuropathic pain 05/09/2017  . Complicated migraine 35/57/3220  . Restless leg syndrome 02/23/2017  . Transient alteration of awareness 08/06/2016  . Hyperreflexia 08/06/2016  . Muscle cramps 08/06/2016  . Left leg pain 02/06/2016  . Impacted ear wax 07/04/2015  . Hearing loss of both ears 07/04/2015  . Ventral hernia 05/16/2015  . Nocturnal leg cramps 05/16/2015  . Bruit 09/20/2014  . Chest pain 09/20/2014  . Aftercare following surgery of the circulatory system, [place] 08/07/2014  . Right elbow pain 01/10/2014  . Right leg pain 06/18/2013  . Goals of care, counseling/discussion 05/04/2013  . Impaired glucose tolerance 05/04/2013  . CAD, NATIVE VESSEL 10/01/2010  . Nonspecific abnormal electrocardiogram (ECG) (EKG) 09/04/2010  . Hyperlipidemia 08/27/2010  . OSTEOARTHRITIS, HANDS, BILATERAL 08/27/2010  . Atherosclerosis of native arteries of extremity with intermittent claudication ([place]) 09/06/2008    Past Surgical History:  Procedure Laterality Date  . ds/p left femoral artery occlusion  2005   s/p left fem-pop bypass- Dr. Kellie Simmering  . HERNIA REPAIR    . IR GASTROSTOMY TUBE MOD SED  07/24/2020  . IR IMAGING GUIDED  PORT INSERTION  07/24/2020  . MULTIPLE EXTRACTIONS WITH ALVEOLOPLASTY N/A 07/17/2020   Procedure: Extraction of tooth #'s 8,10,17,51,WCH 31 with alveoloplasty;  Surgeon: Lenn Cal, DDS;  Location: [facility];  Service: Oral Surgery;  Laterality: N/A;  . MYRINGOTOMY WITH TUBE PLACEMENT Right 06/26/2020   Procedure: MYRINGOTOMY WITH TUBE PLACEMENT RIGHT  EAR;  Surgeon: Melida Quitter, MD;  Location: [facility];  Service: ENT;  Laterality: Right;  . NASAL ENDOSCOPY N/A 06/26/2020   Procedure: NASAL ENDOSCOPY W/NASOPHARYNGEAL BIOPSY;  Surgeon: Melida Quitter, MD;  Location: [facility];   Service: ENT;  Laterality: N/A;  . PR VEIN BYPASS GRAFT,AORTO-FEM-POP    . s/p right bicep tendon rupture repair    . VENTRAL HERNIA REPAIR N/A 08/22/2015   Procedure: OPEN VENTRAL HERNIA REPAIR WITH MESH;  Surgeon: Jackolyn Confer, MD;  Location: [facility];  Service: General;  Laterality: N/A;       Family History  Problem Relation Age of Onset  . Cervical cancer Mother   . Cancer Mother        cervical  . Lung cancer Father   . Cancer Father        lung    Social History   Tobacco Use  . Smoking status: Former Smoker    Packs/day: 7.00    Years: 40.00    Pack years: 280.00    Types: Cigarettes    Quit date: 11/29/1994    Years since quitting: 25.9  . Smokeless tobacco: Never Used  Vaping Use  . Vaping Use: Never used  Substance Use Topics  . Alcohol use: No  . Drug use: No    Home Medications Prior to Admission medications   Medication Sig Start Date End Date Taking? Authorizing Provider  amoxicillin-clavulanate (AUGMENTIN) 125-31.25 MG/5ML suspension Take 5 mLs (125 mg total) by mouth 3 (three) times daily. 09/01/20   Wyatt Portela, MD  gabapentin (NEURONTIN) 250 MG/5ML solution Take 6 mLs (300 mg total) by mouth 3 (three) times daily. May take by PEG tube. 09/15/20   Eppie Gibson, MD  lansoprazole (PREVACID SOLUTAB) 15 MG disintegrating tablet Dissolve one tablet in mouth daily.  (Affordable alternative to previous liquid Rx for Omeprazole). 08/05/20   Eppie Gibson, MD  lidocaine (XYLOCAINE) 2 % solution Patient: Mix 1part 2% viscous lidocaine, 1part H20. Swish & swallow 55m of diluted mixture, 354m before meals and at bedtime, up to QID 08/11/20   SqEppie GibsonMD  lidocaine-prilocaine (EMLA) cream Apply 1 application topically as needed. 09/18/20   Tanner, VaLyndon Code PA-C  metoCLOPramide (REGLAN) 5 MG/5ML solution Place 5 mLs (5 mg total) into feeding tube 3 (three) times daily before meals. 09/30/20   ShWyatt PortelaMD  nitroGLYCERIN (NITROSTAT) 0.4 MG SL tablet Place  1 tablet (0.4 mg total) under the tongue every 5 (five) minutes as needed. Place 1 tablet under tongue for chest pain -- not to exceed 3 in 15 minutes 05/04/13   JoBiagio BorgMD  Nutritional Supplements (FEEDING SUPPLEMENT, OSMOLITE 1.5 CAL,) LIQD Give 1-1/2 cartons Osmolite 1.5 via PEG 4 times daily with 60 mL free water before and 120 mL free water after each bolus feeding.  This provides 2130 cal, 89.4 g protein, 1806 mL free water/100% estimated nutrition needs.  Please send formula and supplies. 09/02/20   SqEppie GibsonMD  ondansetron ([facility]4 MG/5ML solution Take 7.5 mLs (6 mg total) by mouth every 8 (eight) hours as needed for nausea or vomiting. Okay to  take by tube, if preferred. Take around the clock, 3 x's daily, if nausea is severe. 09/29/20   Tanner, Lyndon Code., PA-C  oxyCODONE (ROXICODONE) 5 MG/5ML solution Place 7.5 mLs (7.5 mg total) into feeding tube every 4 (four) hours as needed for severe pain (Take with food.). 10/10/20   Eppie Gibson, MD  promethazine (PHENERGAN) 6.25 MG/5ML syrup 6.25 to 12.5 mg Q 6 hours VT prn nausea 09/30/20   Wyatt Portela, MD    Allergies    Patient has no known allergies.  Review of Systems   Review of Systems  Unable to perform ROS: Acuity of condition    Physical Exam Updated Vital Signs BP (!) 167/98   Pulse 93   Temp 97.7 F (36.5 C) (Oral)   Resp (!) 24   SpO2 100%   Physical Exam Vitals and nursing note reviewed.  Constitutional:      General: He is in acute distress.     Appearance: He is well-developed. He is ill-appearing.  HENT:     Head: Normocephalic and atraumatic.     Comments: Large amount of bright red blood with clots coming from his mouth    Nose: No congestion or rhinorrhea.     Mouth/Throat:     Mouth: Mucous membranes are moist.  Cardiovascular:     Rate and Rhythm: Tachycardia present.  Pulmonary:     Effort: Pulmonary effort is normal. No respiratory distress.  Abdominal:     General: There is no distension.   Musculoskeletal:        General: Normal range of motion.     Cervical back: Normal range of motion.  Skin:    Coloration: Skin is pale.  Neurological:     General: No focal deficit present.     ED Results / Procedures / Treatments   Labs (all labs ordered are listed, but only abnormal results are displayed) Labs Reviewed  CBC WITH DIFFERENTIAL/PLATELET - Abnormal; Notable for the following components:      Result Value   RBC 3.01 (*)    Hemoglobin 9.3 (*)    HCT 30.6 (*)    MCV 101.7 (*)    Platelets 414 (*)    Monocytes Absolute 1.2 (*)    All other components within normal limits  COMPREHENSIVE METABOLIC PANEL - Abnormal; Notable for the following components:   Sodium 134 (*)    Chloride 93 (*)    Glucose, Bld 204 (*)    BUN 24 (*)    Creatinine, Ser 1.40 (*)    Total Protein 6.1 (*)    Albumin 2.7 (*)    GFR, Estimated 54 (*)    Anion gap 17 (*)    All other components within normal limits  DIC (DISSEMINATED INTRAVASCULAR COAGULATION) PANEL (NOT AT [facility]) - Abnormal; Notable for the following components:   Prothrombin Time 17.6 (*)    INR 1.5 (*)    D-Dimer, Quant 0.76 (*)    Platelets 137 (*)    All other components within normal limits  HEMOGLOBIN AND HEMATOCRIT, BLOOD - Abnormal; Notable for the following components:   Hemoglobin 12.4 (*)    HCT 38.2 (*)    All other components within normal limits  GLUCOSE, CAPILLARY - Abnormal; Notable for the following components:   Glucose-Capillary 194 (*)    All other components within normal limits  I-STAT ARTERIAL BLOOD GAS, ED - Abnormal; Notable for the following components:   pH, Arterial 7.309 (*)    pCO2 arterial  54.2 (*)    pO2, Arterial 344 (*)    Sodium 132 (*)    Calcium, Ion 1.09 (*)    HCT 26.0 (*)    Hemoglobin 8.8 (*)    All other components within normal limits  RESPIRATORY PANEL BY RT PCR (FLU A&B, COVID)  MRSA PCR SCREENING  PROTIME-INR  DIC (DISSEMINATED INTRAVASCULAR COAGULATION) PANEL (NOT  AT [facility])  DIC (DISSEMINATED INTRAVASCULAR COAGULATION) PANEL (NOT AT [facility])  HEMOGLOBIN AND HEMATOCRIT, BLOOD  HEMOGLOBIN AND HEMATOCRIT, BLOOD  BASIC METABOLIC PANEL  CBC  MAGNESIUM  PHOSPHORUS  PROTIME-INR  APTT  TYPE AND SCREEN  PREPARE FRESH FROZEN PLASMA  PREPARE PLATELET PHERESIS  MASSIVE TRANSFUSION PROTOCOL ORDER (BLOOD BANK NOTIFICATION)  PREPARE CRYOPRECIPITATE  ABO/RH    EKG None  Radiology No results found.  Procedures Procedure Name: Intubation Date/Time: 10/19/2020 4:09 AM Performed by: Merrily Pew, MD Pre-anesthesia Checklist: Patient identified, Patient being monitored, Emergency Drugs available, Timeout performed and Suction available Oxygen Delivery Method: Non-rebreather mask Preoxygenation: Pre-oxygenation with 100% oxygen Induction Type: Rapid sequence Ventilation: Mask ventilation without difficulty Laryngoscope Size: Miller and 3 Grade View: Grade II Tube size: 7.0 mm Number of attempts: 2 Airway Equipment and Method: Stylet Placement Confirmation: ETT inserted through vocal cords under direct vision,  CO2 detector and Breath sounds checked- equal and bilateral Secured at: 22 cm Tube secured with: ETT holder Difficulty Due To: Difficult Airway- due to reduced neck mobility, Difficult Airway- due to anterior larynx and Difficulty was anticipated Future Recommendations: Recommend- induction with short-acting agent, and alternative techniques readily available    .Critical Care Performed by: Merrily Pew, MD Authorized by: Merrily Pew, MD   Critical care provider statement:    Critical care time (minutes):  16   Critical care was necessary to treat or prevent imminent or life-threatening deterioration of the following conditions:  Cardiac failure, circulatory failure, CNS failure or compromise, dehydration, shock and respiratory failure   Critical care was time spent personally by me on the following activities:  Discussions with  consultants, evaluation of patient's response to treatment, examination of patient, ordering and performing treatments and interventions, ordering and review of laboratory studies, ordering and review of radiographic studies, pulse oximetry, re-evaluation of patient's condition, obtaining history from patient or surrogate and review of old charts   (including critical care time)  Medications Ordered in ED Medications  lactated ringers bolus 1,000 mL (has no administration in time range)  rocuronium bromide 100 MG/10ML SOSY (has no administration in time range)  etomidate (AMIDATE) 2 MG/ML injection (has no administration in time range)  etomidate (AMIDATE) 2 MG/ML injection (has no administration in time range)  propofol (DIPRIVAN) 1000 MG/100ML infusion (has no administration in time range)  chlorhexidine gluconate (MEDLINE KIT) (PERIDEX) 0.12 % solution 15 mL (has no administration in time range)  MEDLINE mouth rinse (has no administration in time range)  famotidine (PEPCID) IVPB 20 mg premix (has no administration in time range)  fentaNYL (SUBLIMAZE) injection 25-100 mcg (has no administration in time range)  midazolam (VERSED) injection 1 mg (has no administration in time range)  Chlorhexidine Gluconate Cloth 2 % PADS 6 each (has no administration in time range)  sodium chloride flush (NS) 0.9 % injection 10-40 mL (has no administration in time range)  sodium chloride flush (NS) 0.9 % injection 10-40 mL (has no administration in time range)  Chlorhexidine Gluconate Cloth 2 % PADS 6 each (has no administration in time range)  chlorhexidine gluconate (MEDLINE KIT) (PERIDEX) 0.12 % solution  15 mL (has no administration in time range)  MEDLINE mouth rinse (has no administration in time range)  tranexamic acid (CYKLOKAPRON) IVPB 1,000 mg (0 mg Intravenous Stopped 10/19/20 0101)  midazolam (VERSED) 5 MG/5ML injection ( Intravenous Canceled Entry 10/19/20 0145)  EPINEPHrine (ADRENALIN) 1 MG/10ML  injection (1 mg Intravenous Given 10/19/20 0036)  calcium chloride injection (1 g Intravenous Given 10/19/20 0158)  fentaNYL (SUBLIMAZE) injection (50 mcg Intravenous Given 10/19/20 0057)  rocuronium (ZEMURON) injection (100 mg Intravenous Given 10/19/20 0031)  etomidate (AMIDATE) injection (20 mg Intravenous Given 10/19/20 0030)  propofol (DIPRIVAN) 1000 MG/100ML infusion (10 mcg/kg/min  49.1 kg Intravenous New Bag/Given 10/19/20 0215)  midazolam (VERSED) injection 2 mg (2 mg Intravenous Given 10/19/20 0302)  iohexol (OMNIPAQUE) 350 MG/ML injection 75 mL (75 mLs Intravenous Contrast Given 10/19/20 0316)    ED Course  I have reviewed the triage vital signs and the nursing notes.  Pertinent labs & imaging results that were available during my care of the patient were reviewed by me and considered in my medical decision making (see chart for details).    MDM Rules/Calculators/A&P                          My first evaluation patient was unresponsive with active bleeding from his mouth.  He was tachycardic.  Decision was made to intubate.  Patient had a return of consciousness prior to intubation stated he felt he was bleeding from the middle of the right side of his neck.  Stated he would want full measures taken to save his life.  Patient was subsequently intubated.  His pressures dropped so couple doses of epinephrine given pending the Levophed get started.  2 units of unmatched blood were given and then he continued to bleed so massive transfusion fusion protocol was initiated.  ENT was consulted for concern of possible nasopharyngeal bleeding.  TXA was given.  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  IR consulted.  I was at bedside administering medications, adjusting pressors and directing resuscitative efforts. After multiple units of blood, FFP and platelets (see documentation by  nursing), multiple changes in Levophed and liters of fluid patient stabilized.  His hemoglobin started to improve.  DIC panel still significantly abnormal however patient's color improved and his heart rate improved.  Code care was consulted for admission.  Family is updated.  Chaplain at bedside with them.  Still full code at this time.   Final Clinical Impression(s) / ED Diagnoses Final diagnoses:  Hemorrhagic shock [facility])    Rx / DC Orders ED Discharge Orders    None       Nylene Inlow, Corene Cornea, MD 10/19/20 [phone]

## 2020-10-19 NOTE — ED Notes (Signed)
Patient continuing  to vomit blood. This RN Attempting to suction patient. Patient unresponsive.

## 2020-10-19 NOTE — ED Notes (Signed)
Patient in room vomiting bright red blood with clots. Provider made aware.

## 2020-10-19 NOTE — Progress Notes (Signed)
69mcg Fentanyl patch found, removed and wasted from patients back while giving bath.  Autumn, RN verified wasted in sharps box at bedside.  MD made aware.

## 2020-10-19 NOTE — Progress Notes (Signed)
eLink Physician-Brief Progress Note Patient Name: ARDA KEADLE DOB: [birth year] MRN: [id number]   Date of Service  10/19/2020  HPI/Events of Note  Multiple issues: 1. Patient on Propofol IV infusion and no orders for Propofol IV infusion and 2. Hypertension - BP = 171/98.  eICU Interventions  Plan: 1. Propofol IV infusion. Titrate to RASS = 0 to -1. 2. Hydralazine 10 mg IV Q 4 hours PRN SBP > 160 or DBP > 100.     Intervention Category Major Interventions: Hypercarbia - evaluation and management;Other:  Lysle Dingwall 10/19/2020, 5:58 AM

## 2020-10-19 NOTE — ED Notes (Signed)
Provider at bedside . Patient still vomiting blood and very anxious. Patient pale. Patient hypotensive.

## 2020-10-19 NOTE — ED Notes (Signed)
Patient still very hypotensive and pale. Massive transfusion protocol initiated by Mesner MD

## 2020-10-20 DIAGNOSIS — Z66 Do not resuscitate: Secondary | ICD-10-CM | POA: Diagnosis not present

## 2020-10-20 DIAGNOSIS — Z515 Encounter for palliative care: Secondary | ICD-10-CM | POA: Diagnosis not present

## 2020-10-20 DIAGNOSIS — J9611 Chronic respiratory failure with hypoxia: Secondary | ICD-10-CM

## 2020-10-20 DIAGNOSIS — Z7189 Other specified counseling: Secondary | ICD-10-CM

## 2020-10-20 LAB — CBC
HCT: 51.8 % (ref 39.0–52.0)
Hemoglobin: 17.2 g/dL — ABNORMAL HIGH (ref 13.0–17.0)
MCH: 28.7 pg (ref 26.0–34.0)
MCHC: 33.2 g/dL (ref 30.0–36.0)
MCV: 86.3 fL (ref 80.0–100.0)
Platelets: 107 10*3/uL — ABNORMAL LOW (ref 150–400)
RBC: 6 MIL/uL — ABNORMAL HIGH (ref 4.22–5.81)
RDW: 16.4 % — ABNORMAL HIGH (ref 11.5–15.5)
WBC: 12.5 10*3/uL — ABNORMAL HIGH (ref 4.0–10.5)
nRBC: 0 % (ref 0.0–0.2)

## 2020-10-20 LAB — PREPARE PLATELET PHERESIS: Unit division: 0

## 2020-10-20 LAB — BASIC METABOLIC PANEL
Anion gap: 12 (ref 5–15)
BUN: 29 mg/dL — ABNORMAL HIGH (ref 8–23)
CO2: 27 mmol/L (ref 22–32)
Calcium: 8.7 mg/dL — ABNORMAL LOW (ref 8.9–10.3)
Chloride: 101 mmol/L (ref 98–111)
Creatinine, Ser: 1.23 mg/dL (ref 0.61–1.24)
GFR, Estimated: >60 mL/min (ref >60)
Glucose, Bld: 101 mg/dL — ABNORMAL HIGH (ref 70–99)
Potassium: 3.1 mmol/L — ABNORMAL LOW (ref 3.5–5.1)
Sodium: 140 mmol/L (ref 135–145)

## 2020-10-20 LAB — GLUCOSE, CAPILLARY
Glucose-Capillary: 119 mg/dL — ABNORMAL HIGH (ref 70–99)
Glucose-Capillary: 103 mg/dL — ABNORMAL HIGH (ref 70–99)
Glucose-Capillary: 101 mg/dL — ABNORMAL HIGH (ref 70–99)

## 2020-10-20 LAB — BPAM PLATELET PHERESIS
Blood Product Expiration Date: 202111242359
ISSUE DATE / TIME: 202111210205
Unit Type and Rh: 5100

## 2020-10-20 LAB — TRIGLYCERIDES: Triglycerides: 263 mg/dL — ABNORMAL HIGH (ref <150)

## 2020-10-20 LAB — MAGNESIUM: Magnesium: 2.2 mg/dL (ref 1.7–2.4)

## 2020-10-20 MED ORDER — FENTANYL 2500MCG IN NS 250ML (10MCG/ML) PREMIX INFUSION
0.0000 ug/h | INTRAVENOUS | Status: DC
  Administered 2020-10-20: 25 ug/h via INTRAVENOUS
  Filled 2020-10-20: qty 250

## 2020-10-20 MED ORDER — LORAZEPAM 2 MG/ML IJ SOLN: 0.5000 mg | INTRAMUSCULAR | Status: DC | PRN

## 2020-10-20 MED ORDER — METOPROLOL TARTRATE 5 MG/5ML IV SOLN
INTRAVENOUS | Status: AC
  Filled 2020-10-20: qty 5

## 2020-10-20 MED ORDER — PROMETHAZINE HCL 25 MG/ML IJ SOLN
12.5000 mg | Freq: Four times a day (QID) | INTRAMUSCULAR | Status: DC | PRN
  Filled 2020-10-20: qty 1

## 2020-10-20 MED ORDER — POTASSIUM CHLORIDE 20 MEQ PO PACK
20.0000 meq | PACK | ORAL | Status: AC
  Administered 2020-10-20: 20 meq
  Filled 2020-10-20: qty 1

## 2020-10-20 MED ORDER — FENTANYL 75 MCG/HR TD PT72
1.0000 | MEDICATED_PATCH | TRANSDERMAL | Status: DC
  Administered 2020-10-20: 1 via TRANSDERMAL
  Filled 2020-10-20: qty 1

## 2020-10-20 MED ORDER — METOPROLOL TARTRATE 5 MG/5ML IV SOLN
INTRAVENOUS | Status: AC
  Administered 2020-10-20: 5 mg
  Filled 2020-10-20: qty 5

## 2020-10-20 MED ORDER — GABAPENTIN 250 MG/5ML PO SOLN
250.0000 mg | Freq: Three times a day (TID) | ORAL | Status: DC
  Filled 2020-10-20 (×2): qty 6

## 2020-10-20 MED ORDER — POTASSIUM CHLORIDE 10 MEQ/100ML IV SOLN
10.0000 meq | INTRAVENOUS | Status: AC
  Administered 2020-10-20 (×4): 10 meq via INTRAVENOUS
  Filled 2020-10-20 (×4): qty 100

## 2020-10-20 MED ORDER — POLYVINYL ALCOHOL 1.4 % OP SOLN
2.0000 [drp] | OPHTHALMIC | Status: DC | PRN
  Filled 2020-10-20: qty 15

## 2020-10-20 MED ORDER — GLYCOPYRROLATE 0.2 MG/ML IJ SOLN: 0.4000 mg | INTRAMUSCULAR | Status: DC | PRN

## 2020-10-20 MED ORDER — FENTANYL CITRATE (PF) 100 MCG/2ML IJ SOLN
25.0000 ug | INTRAMUSCULAR | Status: DC | PRN
  Administered 2020-10-20: 100 ug via INTRAVENOUS
  Administered 2020-10-20: 50 ug via INTRAVENOUS
  Administered 2020-10-20: 100 ug via INTRAVENOUS
  Filled 2020-10-20 (×3): qty 2

## 2020-10-20 MED ORDER — METOCLOPRAMIDE HCL 5 MG/ML IJ SOLN: 5.0000 mg | Freq: Four times a day (QID) | INTRAMUSCULAR | Status: DC | PRN

## 2020-10-20 MED ORDER — FENTANYL CITRATE (PF) 100 MCG/2ML IJ SOLN: 25.0000 ug | INTRAMUSCULAR | Status: DC | PRN

## 2020-10-20 MED ORDER — ACETAMINOPHEN 650 MG RE SUPP: 650.0000 mg | RECTAL | Status: DC | PRN

## 2020-10-20 NOTE — Consult Note (Signed)
Note to chart:  I spoke with Dr.Desai yesterday about the patient's admission.  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  I recommending obtaining CT with contrast of the patient's head and neck once he is relatively stable to get a better understanding of his oncologic status.  Below are images depicting his cone beam CT on treatment planning day (07/21/20) and his cone beam CT on final day 35 (Sep 17, 2020) of radiation.There was significant internal necrosis of the tumor and it is possible that surrounding vessel walls had lost integrity due to tumor-related invasion/damage. As the tumor continued to disintegrate, a bleed may have occurred secondarily in the weakened vessels.  That said, it will be helpful to obtain new imaging as a month has elapsed since the second image depicted below.  Eppie Gibson MD Radiation Oncology  07-21-20:    09-17-20, final treatment:

## 2020-10-20 NOTE — Progress Notes (Signed)
eLink Physician-Brief Progress Note Patient Name: Willie Kennedy DOB: 27-Apr-1951 MRN: [id number]   Date of Service  10/20/2020  HPI/Events of Note  Hypertriglyceridemia - Triglyceride level = 263. Patient is on a Propofol IV infusion for sedation.   eICU Interventions  Plan: 1. D/C Propofol IV infusion.  2. Fentanyl IV infusion. Titrate to RASS = 0 to -1.     Intervention Category Major Interventions: Other:  Lysle Dingwall 10/20/2020, 5:19 AM

## 2020-10-20 NOTE — Progress Notes (Addendum)
10/20/2020 I saw and evaluated the patient. Discussed with resident and agree with resident's findings and plan as documented in the resident's note.  I have seen and evaluated the patient for respiratory failure and hemorrhagic shock.  S:  No events, awake on vent, following commands, mild bloody secretions on ETT.  O: Blood pressure 139/90, pulse 91, temperature 98.3 F (36.8 C), temperature source Oral, resp. rate 12, SpO2 97 %.  No acute distress on vent Small blood from ETT suctioning No blood in nares  A:  -Hemorrhagic shock related to stage 3 SCC of Rt nasopharynx, improved, nothing actionable on CTA Head/Neck.   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 - Dysphagia related to above  P:  - Wean to extubate - GoC discussion - Repeat CT sinus +/- nasopharyngoscopy later this week depending on patient wishes, he does not want this today    Patient critically ill due to respiratory failure Interventions to address this today ventilator weaning Risk of deterioration without these interventions is high  I personally spent 35 minutes providing critical care not including any separately billable procedures  Erskine Emery MD [facility] 10/20/2020 9:58 AM Personal pager: [phone] If unanswered, please page CCM On-call: [phone]   10/20/2020 Erskine Emery MD      NAME:  Willie Kennedy, MRN:  [id number], DOB:  June 03, 1951, LOS: 1 ADMISSION DATE:  10/18/2020, CONSULTATION DATE:  10/19/20 REFERRING MD:  Dr. 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  He required massive transfusion and intubation.  Hx from chart, medical  team and family.  Past Medical History  Tinnitus, OA, HLD, Hiatal hernia, Headache, CAD, PAD  Significant Hospital Events   11/21 admit  Consults:  ENT  Procedures:  ETT 11/21 >> 11/22  Significant Diagnostic Tests:  CT angio neck 11/21 >> No evidence of active extravasastion, large lytic mass of the right nasopharynx  Micro Data:  COVID/Flu 11/21 >> negative  Antimicrobials:     Interim history/subjective:  Patient extubated this morning. He expressed preference to avoid intubation and would like to see wife. Patient's son and daughter in law is at bedside this morning.  Objective   Blood pressure 139/90, pulse 95, temperature 100.1 F (37.8 C), temperature source Axillary, resp. rate (!) 24, SpO2 98 %.    Vent Mode: PRVC FiO2 (%):  [40 %-50 %] 40 % Set Rate:  [24 bmp] 24 bmp Vt Set:  [500 mL] 500 mL PEEP:  [5 cmH20] 5 cmH20 Plateau Pressure:  [15 cmH20-18 cmH20] 18 cmH20   Intake/Output Summary (Last 24 hours) at 10/20/2020 0726 Last data filed at 10/20/2020 0700 Gross per 24 hour  Intake 586.58 ml  Output 350 ml  Net 236.58 ml   There were no vitals filed for this visit.  Examination:  General - Cachectic mand laying in bed, no acute distress Eyes - pupils reactive, no conjunctivisits ENT - Dark red blood from mouth Cardiac - regular rate/rhythm, no murmur Chest - equal breath sounds b/l, no wheezing or rales, port Rt upper chest Abdomen - soft, non tender, +bowel sounds, G tube in place Extremities - decreased muscle bulk Skin - no rashes Neuro - Awake, alert oriented  x3, following commands Resolved Hospital Problem list     Assessment & Plan:  Hemorrhagic shock s/p massive transfusion likely from nasopharyngeal cancer possible eroding in blood vessel. - bleeding controlled in ER after receiving tranexamic acid - CT angio without active extravasation - ENT consulted, recommending fiberoptic endoscopy which the patient would not like to have done  today, consider possible CT neck to evaluate for tumor structure. - Continue to monitor for further bleeding  Acute hypoxic respiratory failure with compromised airway. Advanced emphysema. - Extubated this morning, now saturating well on [place]  Stage 3 SCC of Rt nasopharynx. - followed by Dr. Alen Blew and Dr. Isidore Moos - completed chemoradiation therapy, no additional role for chemo or radiation - Will update Dr. Alen Blew this morning and discuss need for further imaging  Acute metabolic encephalopathy 2nd to shock. Chronic pain. - Awake and alert this morning, oriented person, place and situation, not currently in pain - hold outpt neurontin, oxycodone  Goals of care. - spoke with pt's brother and daughter in law at bedside. No POA, daughter in law is patients primary caretaker. - Patient prefers not to have fiberoptic endoscopy today - Will consult palliative care, greatly appreciate recommendations. - will continue to have goals of care discussion based on clinical findings and test results   Best practice:  Diet: start  NPO DVT prophylaxis: SCD GI prophylaxis: Pepcid Mobility: bed rest Code Status: full code Disposition: ICU  Labs   CBC: Recent Labs  Lab 10/19/20 0022 10/19/20 0112 10/19/20 0146 10/19/20 0604  WBC 10.2  --   --  8.2  NEUTROABS 7.6  --   --   --   HGB 9.3* 8.8* 12.4* 16.2  HCT 30.6* 26.0* 38.2* 47.4  MCV 101.7*  --   --  83.0  PLT 414*  --  137* 104*    Basic Metabolic Panel: Recent Labs  Lab 10/19/20 0022 10/19/20 0112 10/19/20 0604 10/20/20 0417  NA 134* 132* 140 140  K 4.1 3.6 3.4* 3.1*  CL 93*  --  96* 101  CO2 24  --  30 27  GLUCOSE 204*  --  80 101*  BUN 24*  --  23 29*  CREATININE 1.40*  --  1.17 1.23  CALCIUM 9.1  --  9.5 8.7*  MG  --   --  2.1  --   PHOS  --   --  4.3  --    GFR: Estimated Creatinine Clearance: 38.3 mL/min (by C-G formula based on SCr of 1.23 mg/dL). Recent Labs  Lab 10/19/20 0022 10/19/20 0604  WBC 10.2 8.2      Liver Function Tests: Recent Labs  Lab 10/19/20 0022  AST 20  ALT 12  ALKPHOS 91  BILITOT 0.8  PROT 6.1*  ALBUMIN 2.7*   No results for input(s): LIPASE, AMYLASE in the last 168 hours. No results for input(s): AMMONIA in the last 168 hours.  ABG    Component Value Date/Time   PHART 7.309 (L) 10/19/2020 0112   PCO2ART 54.2 (H) 10/19/2020 0112   PO2ART 344 (H) 10/19/2020 0112   HCO3 27.2 10/19/2020 0112   TCO2 29 10/19/2020 0112   O2SAT 100.0 10/19/2020 0112     Coagulation Profile: Recent Labs  Lab 10/19/20 0022 10/19/20 0146 10/19/20 0604  INR 1.2 1.5* 1.2    Cardiac Enzymes: No results for input(s): CKTOTAL, CKMB, CKMBINDEX, TROPONINI in the last 168 hours.  HbA1C: No results found for: HGBA1C  CBG: Recent Labs  Lab 10/19/20  0340 10/19/20 1151  GLUCAP 194* 102*    Review of Systems:   Unable to obtain  Past Medical History  He,  has a past medical history of Biceps tendon rupture, CAD (coronary artery disease), Cancer ([place]), Dizziness, Hard of hearing, Headache, History of bronchitis, History of hiatal hernia, Hyperlipidemia, Impaired glucose tolerance, Numbness and tingling in left arm, Osteoarthritis, Peripheral vascular disease ([place]) (2005), Shortness of breath dyspnea, and Tinnitus.   Surgical History    Past Surgical History:  Procedure Laterality Date  . ds/p left femoral artery occlusion  2005   s/p left fem-pop bypass- Dr. Kellie Simmering  . HERNIA REPAIR    . IR GASTROSTOMY TUBE MOD SED  07/24/2020  . IR IMAGING GUIDED PORT INSERTION  07/24/2020  . MULTIPLE EXTRACTIONS WITH ALVEOLOPLASTY N/A 07/17/2020   Procedure: Extraction of tooth #'s 5,80,99,83,JAS 31 with alveoloplasty;  Surgeon: Lenn Cal, DDS;  Location: [facility];  Service: Oral Surgery;  Laterality: N/A;  . MYRINGOTOMY WITH TUBE PLACEMENT Right 06/26/2020   Procedure: MYRINGOTOMY WITH TUBE PLACEMENT RIGHT  EAR;  Surgeon: Melida Quitter, MD;  Location: [facility];   Service: ENT;  Laterality: Right;  . NASAL ENDOSCOPY N/A 06/26/2020   Procedure: NASAL ENDOSCOPY W/NASOPHARYNGEAL BIOPSY;  Surgeon: Melida Quitter, MD;  [facility];  Service: ENT;  Laterality: N/A;  . PR VEIN BYPASS GRAFT,AORTO-FEM-POP    . s/p right bicep tendon rupture repair    . VENTRAL HERNIA REPAIR N/A 08/22/2015   Procedure: OPEN VENTRAL HERNIA REPAIR WITH MESH;  Surgeon: Jackolyn Confer, MD;  Location: [facility];  Service: General;  Laterality: N/A;     Social History   reports that he quit smoking about 25 years ago. His smoking use included cigarettes. He has a 280.00 pack-year smoking history. He has never used smokeless tobacco. He reports that he does not drink alcohol and does not use drugs.   Family History   His family history includes Cancer in his father and mother; Cervical cancer in his mother; Lung cancer in his father.   Allergies No Known Allergies   Home Medications  Prior to Admission medications   Medication Sig Start Date End Date Taking? Authorizing Provider  amoxicillin-clavulanate (AUGMENTIN) 125-31.25 MG/5ML suspension Take 5 mLs (125 mg total) by mouth 3 (three) times daily. 09/01/20   Wyatt Portela, MD  gabapentin (NEURONTIN) 250 MG/5ML solution Take 6 mLs (300 mg total) by mouth 3 (three) times daily. May take by PEG tube. 09/15/20   Eppie Gibson, MD  lansoprazole (PREVACID SOLUTAB) 15 MG disintegrating tablet Dissolve one tablet in mouth daily.  (Affordable alternative to previous liquid Rx for Omeprazole). 08/05/20   Eppie Gibson, MD  lidocaine (XYLOCAINE) 2 % solution Patient: Mix 1part 2% viscous lidocaine, 1part H20. Swish & swallow 80mL of diluted mixture, 29min before meals and at bedtime, up to QID 08/11/20   Eppie Gibson, MD  lidocaine-prilocaine (EMLA) cream Apply 1 application topically as needed. 09/18/20   Tanner, Lyndon Code., PA-C  metoCLOPramide (REGLAN) 5 MG/5ML solution Place 5 mLs (5 mg total) into feeding tube 3 (three)  times daily before meals. 09/30/20   Wyatt Portela, MD  nitroGLYCERIN (NITROSTAT) 0.4 MG SL tablet Place 1 tablet (0.4 mg total) under the tongue every 5 (five) minutes as needed. Place 1 tablet under tongue for chest pain -- not to exceed 3 in 15 minutes 05/04/13   Biagio Borg, MD  Nutritional Supplements (FEEDING SUPPLEMENT, OSMOLITE 1.5 CAL,) LIQD Give  1-1/2 cartons Osmolite 1.5 via PEG 4 times daily with 60 mL free water before and 120 mL free water after each bolus feeding.  This provides 2130 cal, 89.4 g protein, 1806 mL free water/100% estimated nutrition needs.  Please send formula and supplies. 09/02/20   Eppie Gibson, MD  ondansetron [facility]) 4 MG/5ML solution Take 7.5 mLs (6 mg total) by mouth every 8 (eight) hours as needed for nausea or vomiting. Okay to take by tube, if preferred. Take around the clock, 3 x's daily, if nausea is severe. 09/29/20   Tanner, Lyndon Code., PA-C  oxyCODONE (ROXICODONE) 5 MG/5ML solution Place 7.5 mLs (7.5 mg total) into feeding tube every 4 (four) hours as needed for severe pain (Take with food.). 10/10/20   Eppie Gibson, MD  promethazine (PHENERGAN) 6.25 MG/5ML syrup 6.25 to 12.5 mg Q 6 hours VT prn nausea 09/30/20   Wyatt Portela, MD     Iona Beard PGY-1 Internal Medicine 10/20/20 8:56 AM

## 2020-10-20 NOTE — Progress Notes (Signed)
PCCM Note  Spoke with patient who states that in event of cardiac or respiratory arrest he would like to be allowed to pass peacefully.  Family agrees and respects this decision.  DNR entered.  Erskine Emery MD PCCM

## 2020-10-20 NOTE — Consult Note (Signed)
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  He required massive transfusion and intubation. Patient extubated this morning and shared that he never wants to go through that again. Made DNAr/DNI by intensivist team.  Palliative care was asked to see Willie Kennedy to further discuss goals of care post hemorrhagic shock.  Clinical Assessment/Goals of Care: I have reviewed medical records including EPIC notes, labs and imaging, received report from bedside RN, assessed the patient who is lying in bed complaining of increased oral secretions.  Utilizing bedside suction.   I met with Willie Kennedy, Willie Kennedy (sister in law), and [place] (spouse) to further discuss diagnosis prognosis, [place], EOL wishes, disposition and options.   I introduced Palliative Medicine as specialized medical care for people living with serious illness. It focuses on providing relief from the symptoms and stress of a serious illness. The goal is to improve quality of life for both the patient and the family.  Love lives in [place], [place] with his wife Willie Kennedy.  Willie Kennedy herself suffers from her own health ailments.  She suffered a stroke leading to right sided weakness and cognitive impairment.  Willie Kennedy has been dealing with nasal pharyngeal squamous cell carcinoma since July.  His wife shares with me that they thought that a cure would be possible and that they "did not see this coming".  We discussed that in a situation such as his he is already in a very fragile state and anything could happen at any time.  Both Willie Kennedy and Willie Kennedy are aware of this and are very realistic about the plans for the future.  They want to be mindful of whatever Willie Kennedy wants in terms of  additional treatments or cessation from treatment.  A detailed discussion was had today regarding advanced directives -patient has none on file.    He relies upon both his wife Willie Kennedy and Willie Kennedy to make decisions in his honor.   Concepts specific to code status, artifical feeding and hydration, continued IV antibiotics and rehospitalization was had.    Patient may DO NOT RESUSCITATE DO NOT INTUBATE CODE STATUS earlier in the day.  He has been fairly assertive with nursing that he wants no additional aggressive interventions.  We talked about transition to comfort measures in house and what that would entail inclusive of medications to control pain, dyspnea, agitation, nausea, itching, and hiccups.  We discussed stopping all uneccessary measures such as telemetry monitoring, blood draws, needle sticks, and frequent vital signs.    Willie Kennedy himself was able to state very clearly that he does not want any additional measures pursued to prolong his life.  And is in agreement with the change in focus to palliation of symptoms.  We then discussed the different environments where end-of-life care could take place we talked about it being likely that death could occur in the hospital, inpatient hospice, or in the home under hospice care.  Patient's wife Willie Kennedy was hopeful to get Willie Kennedy home with home hospice.  There were some concerns that she would not be able to accommodate his complex medical care needs given her own degree of debility.  When spoke with other family members and determined that the best place for the patient would likely be  an inpatient hospice which I would agree with.  She shares that [facility] in hospice of the [place] are equally as distant and she would be open to [place] going to either one.  Willie Kennedy too is in agreement with this plan.  Discussed the importance of continued conversation with family and their  medical providers regarding overall plan of care and treatment options, ensuring  decisions are within the context of the patients values and GOCs.  Decision Maker: Willie Kennedy [phone] [place] [facility] [phone]  SUMMARY OF RECOMMENDATIONS   DNAR/DNI  Comfort focused care  Appreciate transitions of care team trying to find inpatient hospice.  Patient's family is open to either [facility] or hospice of the [facility] inpatient settings  Code Status/Advance Care Planning: DNAR/DNI   Symptom Management:  Dyspnea: Pain:  - Fentanyl 74mg/hr Patch  - Fentanyl injection 25-1073m Q30M IV PRN Fever:  - Tylenol 65056mR Q6H PRN Agitation: Anxiety:  - Lorazepam 1mg33m/SL/IV  Q1H PRN Nausea:  - Phenergan/Reglan IV Q6H PRN  Secretions:  - Glycopyrrolate 0.4mg 32mQ4H PRN Dry Eyes:  - Artificial Tears PRN Xerostomia:  - BID oral care Urinary Retention:  - Bladder scan if > 250ml 6me and maintain foley  Constipation:  - Bisacodyl 10mg P26mN QDay Spiritual:  - Chaplain consult   Palliative Prophylaxis:   Oral Care, Range of motion  Additional Recommendations (Limitations, Scope, Preferences):  Comfort focused care  Psycho-social/Spiritual:   Desire for further Chaplaincy support: Yes - Christian  Additional Recommendations: Education on chronic dieases   Prognosis: Poor in the setting of recent respiratory failure and hemorrhagic shock  Discharge Planning: Discharge to inpatient hospice  Vitals:   10/20/20 1200 10/20/20 1515  BP: 130/71   Pulse: 87   Resp: 19   Temp:  98.7 F (37.1 C)  SpO2: (!) 88%     Intake/Output Summary (Last 24 hours) at 10/20/2020 1621 Last data filed at 10/20/2020 1200 Gross per 24 hour  Intake 603.81 ml  Output 1100 ml  Net -496.19 ml   Gen:  Elderly M intermittently drifting off HEENT: moist mucous membranes CV: Irregular rate and rhythm  PULM: 3LPM [place] clear to auscultation bilaterally  ABD: soft/nontender, PEG in place EXT: No edema  Neuro: Alert and oriented x3   PPS: 10%   This  conversation/these recommendations were discussed with patient primary care team, Dr. Smith  Tamala JulianIn: 1530 Time Out: 1640 Total Time: 70 Greater than 50%  of this time was spent counseling and coordinating care related to the above assessment and plan.  [place]am Cell Phone: [phone] utilize secure chat with additional questions, if there is no response within 30 minutes please call the above phone number  Palliative Medicine Team providers are available by phone from 7am to 7pm daily and can be reached through the team cell phone.  Should this patient require assistance outside of these hours, please call the patient's attending physician.

## 2020-10-20 NOTE — Plan of Care (Signed)

## 2020-10-20 NOTE — Progress Notes (Signed)
ENT follow-up  Patient was extubated this morning.  He has had small amounts of bloody drainage throughout the past 36 hours but nothing serious as when he came in.  The CTA was reviewed that night and there is no obvious vessel that could be treated with interventional radiology.  He is accompanied by his brother and sister-in-law today.  They have clarified a lot of the history.  The sister-in-law has been the one taking care of him.  His wife had a stroke and does not seem to have good memory.  He lost about 40 pounds during treatment.  He has not had bleeding ever since the summertime but has just has occasional blood-tinged sputum.  On exam the patient is lying on his side resting quietly.  His breathing is clear.  There is no active bleeding.  We have discussed options.  My next recommendation is for [place] to perform fiberoptic endoscopy of the nasopharynx nasal cavities pharynx and larynx.  He does not want to do that today.  He will think about it and let us know if he is up for it tomorrow.  He is not overly interested in pursuing any further care and definitely does not want to be intubated again.  Having a CT of the neck would be reasonable option just to get some idea about the either progression or regression of disease following treatment.  I will pass this information along to my partners who will be around this week for the rest of the week.

## 2020-10-20 NOTE — Procedures (Signed)
Extubation Procedure Note  Patient Details:   Name: Willie Kennedy DOB: [birth year] MRN: [id number]   Airway Documentation:    Vent end date: 10/20/20 Vent end time: 0805   Evaluation  O2 sats: stable throughout Complications: No apparent complications Patient did tolerate procedure well. Bilateral Breath Sounds: Clear, Diminished   Pt extubated to 5L [place] per MD order. Pt had positive cuff leak. No stridor noted and pt able to voice his name.   Vilinda Blanks 10/20/2020, 8:06 AM

## 2020-10-20 NOTE — Progress Notes (Signed)
[facility] ADULT ICU REPLACEMENT PROTOCOL   The patient does apply for the [facility] Adult ICU Electrolyte Replacment Protocol based on the criteria listed below:   1. Is GFR >/= 30 ml/min? Yes.    Patient's GFR today is >60 2. Is SCr </= 2? Yes.   Patient's SCr is 1.23 ml/kg/hr 3. Did SCr increase >/= 0.5 in 24 hours? No. 4. Abnormal electrolyte(s): K+ 3.15. Ordered repletion with: protocol 6. If a panic level lab has been reported, has the CCM MD in charge been notified? Yes.  .   Physician:  Dr.Sommer  Carlisle Beers 10/20/2020 5:22 AM

## 2020-10-20 NOTE — Progress Notes (Signed)
Report given to Randall Hiss, RN on 6N and patient transported via bed to [phone] without incident.

## 2020-10-21 ENCOUNTER — Encounter: Payer: Self-pay | Admitting: Radiation Oncology

## 2020-10-21 DIAGNOSIS — Z515 Encounter for palliative care: Secondary | ICD-10-CM | POA: Diagnosis not present

## 2020-10-21 DIAGNOSIS — Z66 Do not resuscitate: Secondary | ICD-10-CM

## 2020-10-21 DIAGNOSIS — R578 Other shock: Secondary | ICD-10-CM | POA: Diagnosis not present

## 2020-10-21 LAB — BPAM FFP
Blood Product Expiration Date: 202111232359
Blood Product Expiration Date: 202111232359
Blood Product Expiration Date: 202111232359
Blood Product Expiration Date: 202111232359
Blood Product Expiration Date: 202111242359
Blood Product Expiration Date: 202111242359
Blood Product Expiration Date: 202111252359
Blood Product Expiration Date: 202111252359
Blood Product Expiration Date: 202111252359
Blood Product Expiration Date: 202111252359
Blood Product Expiration Date: 202111252359
Blood Product Expiration Date: 202111252359
ISSUE DATE / TIME: 202111210102
ISSUE DATE / TIME: 202111210102
ISSUE DATE / TIME: 202111210108
ISSUE DATE / TIME: 202111210108
ISSUE DATE / TIME: 202111210134
ISSUE DATE / TIME: 202111210134
ISSUE DATE / TIME: 202111210146
ISSUE DATE / TIME: 202111210146
ISSUE DATE / TIME: 202111212125
ISSUE DATE / TIME: 202111230106
ISSUE DATE / TIME: 202111230106
ISSUE DATE / TIME: 202111230106
Unit Type and Rh: 5100
Unit Type and Rh: 5100
Unit Type and Rh: 600
Unit Type and Rh: 600
Unit Type and Rh: 600
Unit Type and Rh: 600
Unit Type and Rh: 6200
Unit Type and Rh: 6200
Unit Type and Rh: 6200
Unit Type and Rh: 6200
Unit Type and Rh: 6200
Unit Type and Rh: 6200

## 2020-10-21 LAB — PREPARE FRESH FROZEN PLASMA
Unit division: 0
Unit division: 0
Unit division: 0
Unit division: 0
Unit division: 0
Unit division: 0
Unit division: 0
Unit division: 0
Unit division: 0
Unit division: 0

## 2020-10-21 MED ORDER — CHLORHEXIDINE GLUCONATE CLOTH 2 % EX PADS
6.0000 | MEDICATED_PAD | Freq: Every day | CUTANEOUS | Status: DC
  Administered 2020-10-21: 6 via TOPICAL

## 2020-10-21 MED ORDER — LORAZEPAM 2 MG/ML IJ SOLN
0.5000 mg | INTRAMUSCULAR | 0 refills | Status: AC | PRN
Start: 2020-10-21 — End: ?

## 2020-10-21 MED ORDER — METOCLOPRAMIDE HCL 5 MG/ML IJ SOLN: 5.0000 mg | Freq: Four times a day (QID) | INTRAMUSCULAR | Status: DC | PRN

## 2020-10-21 MED ORDER — PROMETHAZINE HCL 25 MG/ML IJ SOLN: 12.5000 mg | Freq: Four times a day (QID) | INTRAMUSCULAR | 0 refills | Status: AC | PRN

## 2020-10-21 MED ORDER — CHLORHEXIDINE GLUCONATE 0.12 % MT SOLN
OROMUCOSAL | Status: AC
  Filled 2020-10-21: qty 15

## 2020-10-21 MED ORDER — FENTANYL CITRATE (PF) 100 MCG/2ML IJ SOLN
25.0000 ug | INTRAMUSCULAR | 0 refills | Status: AC | PRN
Start: 2020-10-21 — End: ?

## 2020-10-21 MED ORDER — METOCLOPRAMIDE HCL 5 MG/ML IJ SOLN: 5.0000 mg | Freq: Four times a day (QID) | INTRAMUSCULAR | 0 refills | Status: AC | PRN

## 2020-10-21 MED ORDER — ACETAMINOPHEN 650 MG RE SUPP: 650.0000 mg | RECTAL | 0 refills | Status: AC | PRN

## 2020-10-21 MED ORDER — SODIUM CHLORIDE 0.9% FLUSH: 10.0000 mL | Freq: Two times a day (BID) | INTRAVENOUS | Status: DC

## 2020-10-21 MED ORDER — SODIUM CHLORIDE 0.9% FLUSH
10.0000 mL | Freq: Two times a day (BID) | INTRAVENOUS | Status: AC
Start: 2020-10-21 — End: ?

## 2020-10-21 MED ORDER — GLYCOPYRROLATE 0.2 MG/ML IJ SOLN: 0.4000 mg | INTRAMUSCULAR | Status: AC | PRN

## 2020-10-21 NOTE — Progress Notes (Signed)
   Referral received from [facility] with [facility] dept. I have also spoke to [facility] as well. I have spoken to the family and they are in agreement with comfort care and I have introduced them to the transition from hospital to the [facility] in [facility]. I have spoke to our [place] MD and she does agree pt is appropriate for hospice care at the hospice home. We do have a bed available and family did accept the bed offer. They will sign paperwork via [employer] agreement upon receiving that back we can proceed with transport today if MD at hospital in agreement.   The number to call report for nursing is [place] RN [phone]

## 2020-10-21 NOTE — Discharge Summary (Addendum)
Physician Discharge Summary         Patient ID: Willie Kennedy MRN: [id number] DOB/AGE: December 26, 1950 69 y.o.  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  He required intubation for airway stabilization and massive transfusion he was admitted to the intensive care for supportive care.  Therapeutic interventions included intubation, transfusion, vasoactive drips.  ENT was consulted, as were radiation oncology and palliative care.  CT angiogram was done that showed no evidence of active extravasation and there was a large lytic mass of the right nasopharynx.In regards to his cancer he is already received both chemotherapy and radiation therapy.  There are no additional role for either of these.  There is no surgical intervention that we could offer from an ENT standpoint.  The patient had extensive goals of care discussion.  He was successfully extubated on 11/22.  Based on discussion with the patient and his family the patient had expressed a desire to no longer go through advanced cardiac life support or aggressive therapies.  He did not want additional measures to prolong his life, and simply asked that symptom management be the goal of his care.  He was excepted hospice house of [facility], and is stable for transport as of 11/23.  Discharge Plan by Active Problems   Stage III squamous cell carcinoma of the right nasopharynx, now status post  life-threatening hemorrhagic shock and resultant respiratory failure from aspiration -Shock resolved -Extubated -Now requesting focus on symptom management only -Full DO NOT RESUSCITATE -agrees to transfer to hospice house of [place] We will discharge to hospice house of [name] today Continue supplemental oxygen We will continue Port-A-Cath access Keep Foley in place As needed analgesia anxiolytics and antiemetics as indicated   Significant Hospital tests/ studies  CTA angio neck 11/21 no evidence of active extravasation large lytic mass of the right nasopharynx Procedures   Endotracheal tube 11/21 through 11/22 Culture data/antimicrobials   Covid was negative   Consults  ENT Palliative care team Radiation oncology    Discharge Exam: Blood Pressure 139/78 (BP Location: Right Arm)   Pulse 80   Temperature 97.8 F (36.6 C) (Axillary)   Respiration 18   Oxygen Saturation 94%   This is a frail 69 year old male patient who is currently resting in bed.  He is in no acute distress but does endorse head and neck discomfort HEENT normocephalic atraumatic no jugular venous distention is appreciated mucous membranes are moist Pulmonary: Clear to auscultation, does have crackles in bases but no accessory use, on room air  Cardiac: Regular rate and rhythm Abdomen: Soft nontender Extremities: Dry scaly but no rashes Neuro awake oriented  Labs at discharge   Lab Results  Component Value Date   CREATININE 1.23 10/20/2020   BUN 29 (H) 10/20/2020   NA 140 10/20/2020   K 3.1 (L) 10/20/2020   CL 101 10/20/2020   CO2 27 10/20/2020   Lab Results  Component Value Date   WBC 12.5 (H) 10/20/2020   HGB 17.2 (H) 10/20/2020  HCT 51.8 10/20/2020   MCV 86.3 10/20/2020   PLT 107 (L) 10/20/2020   Lab Results  Component Value Date   ALT 12 10/19/2020   AST 20 10/19/2020   ALKPHOS 91 10/19/2020   BILITOT 0.8 10/19/2020   Lab Results  Component Value Date   INR 1.2  10/19/2020   INR 1.5 (H) 10/19/2020   INR 1.2 10/19/2020    Current radiological studies    No results found.  Disposition:  [facility]  Discharge disposition: 51-Hospice/Medical Facility       Diet  NPO   Allergies as of 10/21/2020   No Known Allergies     Medication List    Stop taking these medications   Augmentin 125-31.25 MG/5ML suspension Generic drug: amoxicillin-clavulanate   feeding supplement (OSMOLITE 1.5 CAL) Liqd   gabapentin 250 MG/5ML solution Commonly known as: Neurontin   guaiFENesin 100 MG/5ML liquid Commonly known as: ROBITUSSIN   nitroGLYCERIN 0.4 MG SL tablet Commonly known as: NITROSTAT   promethazine 6.25 MG/5ML syrup Commonly known as: PHENERGAN Replaced by: promethazine 25 MG/ML injection   promethazine-dextromethorphan 6.25-15 MG/5ML syrup Commonly known as: PROMETHAZINE-DM     Take these medications   acetaminophen 650 MG suppository Commonly known as: TYLENOL Place 1 suppository (650 mg total) rectally every 4 (four) hours as needed for fever.   fentaNYL 100 MCG/2ML injection Commonly known as: SUBLIMAZE Inject 0.5-2 mLs (25-100 mcg total) into the vein every 30 (thirty) minutes as needed (to maintain RASS & CPOT goal.).   fentaNYL 75 MCG/HR Commonly known as: [place] 1 patch onto the skin every 3 (three) days.   glycopyrrolate 0.2 MG/ML injection Commonly known as: ROBINUL Inject 2 mLs (0.4 mg total) into the vein every 4 (four) hours as needed (Secretions).   lansoprazole 15 MG disintegrating tablet Commonly known as: PREVACID SOLUTAB Dissolve one tablet in mouth daily.  (Affordable alternative to previous liquid Rx for Omeprazole).   lidocaine 2 % solution Commonly known as: XYLOCAINE Patient: Mix 1part 2% viscous lidocaine, 1part H20. Swish & swallow 42mL of diluted mixture, 42min before meals and at bedtime, up to QID   lidocaine-prilocaine cream Commonly known as: EMLA Apply 1  application topically as needed. What changed: reasons to take this   LORazepam 2 MG/ML injection Commonly known as: ATIVAN Inject 0.25-0.5 mLs (0.5-1 mg total) into the vein every hour as needed for anxiety, seizure or sedation.   metoCLOPramide 5 MG/5ML solution Commonly known as: REGLAN Place 5 mLs (5 mg total) into feeding tube 3 (three) times daily before meals. What changed: Another medication with the same name was added. Make sure you understand how and when to take each.   metoCLOPramide 5 MG/ML injection Commonly known as: REGLAN Inject 1 mL (5 mg total) into the vein every 6 (six) hours as needed. What changed: You were already taking a medication with the same name, and this prescription was added. Make sure you understand how and when to take each.   ondansetron 4 MG/5ML solution Commonly known as: Zofran Take 7.5 mLs (6 mg total) by mouth every 8 (eight) hours as needed for nausea or vomiting. Okay to take by tube, if preferred. Take around the clock, 3 x's daily, if nausea is severe.   oxyCODONE 5 MG/5ML solution Commonly known as: ROXICODONE Place 7.5 mLs (7.5 mg total) into feeding tube every 4 (four) hours as needed for severe pain (Take with food.).   promethazine 25 MG/ML injection Commonly known as:  PHENERGAN Inject 0.5 mLs (12.5 mg total) into the vein every 6 (six) hours as needed for nausea or vomiting. Replaces: promethazine 6.25 MG/5ML syrup   sodium chloride flush 0.9 % Soln Commonly known as: NS 10-40 mLs by Intracatheter route every 12 (twelve) hours.        Follow-up appointment   Not applicable  Discharge Condition:    terminally ill and appropriate for hospice   Physician Statement:   The Patient was personally examined, the discharge assessment and plan has been personally reviewed and I agree with ACNP Ayaansh Smail's assessment and plan. 32  minutes of time have been dedicated to discharge assessment, planning and discharge instructions.    Signed: Clementeen Graham 10/21/2020, 1:12 PM

## 2020-10-21 NOTE — TOC Initial Note (Addendum)
Transition of Care [facility]) - Initial/Assessment Note    Patient Details  Name: Willie Kennedy MRN: [id number] Date of Birth: [birth year]  Transition of Care [facility]) CM/SW Contact:    Marilu Favre, RN Phone Number: 10/21/2020, 12:30 PM  Clinical Narrative:                 Spoke to patient, wife [name] and grand daughter at bedside. They are all in agreement for [facility] or [facility]. Cheri with [facility] has confirmed a bed for today. Patient and family in agreement. [facility] has emailed daughter in law paperwork , once paperwork completed Cheri will let NCM know to call [facility]. Family aware. ''Will need DNR form signed   62 DNR form signed . Cheri with Hospice has received paperwork and would like [facility] called for 4:30 pm pick up. Number to call report is [phone]. Messaged MD and bedside RN.   [facility] paperwork in shadow chart   Wife aware   Expected Discharge Plan: [place] Barriers to Discharge: No Barriers Identified   Patient Goals and CMS Choice Patient states their goals for this hospitalization and ongoing recovery are:: to be comfortable CMS Medicare.gov Compare Post Acute Care list provided to:: Patient Choice offered to / list presented to : Spouse, Patient, Adult Children  Expected Discharge Plan and Services Expected Discharge Plan: [place]                         DME Arranged: N/A DME Agency: NA       HH Arranged: NA          Prior Living Arrangements/Services   Lives with:: Spouse Patient language and need for interpreter reviewed:: Yes              Criminal Activity/Legal Involvement Pertinent to Current Situation/Hospitalization: No - Comment as needed  Activities of Daily Living      Permission Sought/Granted   Permission granted to share information with : Yes, Verbal Permission Granted  Share Information with NAME: Kizzie Fantasia           Emotional  Assessment Appearance:: Appears stated age            Admission diagnosis:  Hemorrhagic shock ([place]) [R57.8] Patient Active Problem List   Diagnosis Date Noted   Palliative care by specialist    DNR (do not resuscitate)    Hemorrhagic shock ([place]) 10/19/2020   Carcinoma of posterior wall of nasopharynx ([place]) 07/18/2020   Nasopharyngeal cancer ([place]) 07/08/2020   Neuropathy 07/11/2019   DJD (degenerative joint disease) 07/11/2019   Decreased ROM of left elbow 10/03/2017   Pain in left wrist 09/26/2017   Biceps rupture, distal, left, initial encounter 06/27/2017   Neuropathic pain 28/31/5176   Complicated migraine 16/05/3709   Restless leg syndrome 02/23/2017   Transient alteration of awareness 08/06/2016   Hyperreflexia 08/06/2016   Muscle cramps 08/06/2016   Left leg pain 02/06/2016   Impacted ear wax 07/04/2015   Hearing loss of both ears 07/04/2015   Ventral hernia 05/16/2015   Nocturnal leg cramps 05/16/2015   Bruit 09/20/2014   Chest pain 09/20/2014   Aftercare following surgery of the circulatory system, NEC 08/07/2014   Right elbow pain 01/10/2014   Right leg pain 06/18/2013   Terminal care 05/04/2013   Impaired glucose tolerance 05/04/2013   CAD, NATIVE VESSEL 10/01/2010   Nonspecific abnormal electrocardiogram (ECG) (EKG) 09/04/2010  Hyperlipidemia 08/27/2010   OSTEOARTHRITIS, HANDS, BILATERAL 08/27/2010   Atherosclerosis of native arteries of extremity with intermittent claudication ([place]) 09/06/2008   PCP:  Patient, No Pcp Per Pharmacy:   [facility] [place], [address] [place] [address] Phone: [phone] Fax: [phone]     Social Determinants of Health (SDOH) Interventions    Readmission Risk Interventions No flowsheet data found.

## 2020-10-21 NOTE — Progress Notes (Signed)
Subjective: No active bleeding.  Objective: Vital signs in last 24 hours: Temp:  [98.2 F (36.8 C)-99.1 F (37.3 C)] 98.2 F (36.8 C) (11/22 2243) Pulse Rate:  [67-87] 78 (11/23 0540) Resp:  [15-20] 18 (11/23 0540) BP: (127-144)/(68-80) 143/80 (11/23 0540) SpO2:  [88 %-98 %] 96 % (11/23 0540) Wt Readings from Last 1 Encounters:  10/10/20 49.1 kg    Intake/Output from previous day: 11/22 0701 - 11/23 0700 In: 346.4 [I.V.:32.6; IV Piggyback:313.8] Out: 1075 [Urine:1075] Intake/Output this shift: No intake/output data recorded.  General appearance: alert, cooperative and no distress Throat: no active bleeding  Recent Labs    10/19/20 0604 10/20/20 0925  WBC 8.2 12.5*  HGB 16.2 17.2*  HCT 47.4 51.8  PLT 104* 107*    Recent Labs    10/19/20 0604 10/20/20 0417  NA 140 140  K 3.4* 3.1*  CL 96* 101  CO2 30 27  GLUCOSE 80 101*  BUN 23 29*  CREATININE 1.17 1.23  CALCIUM 9.5 8.7*    Medications: I have reviewed the patient's current medications.  Assessment/Plan: Nasopharyngeal cancer, recent hemorrhage  Stable currently.  Unfortunately, his recent bleed may be sentinel to further bleeding.  The right internal carotid artery is obliterated through the level of the tumor but there are vertebrobasilar branches also nearby.  Could consider formal arteriogram with potential embolization if bleeding recurs.  Agree with plans for inpatient hospice care.   LOS: 2 days   Melida Quitter 10/21/2020, 10:28 AM

## 2020-10-21 NOTE — Progress Notes (Signed)
Palliative Medicine RN Note: Rec'd request from [facility]T to expedite hospice referral. Note that family is requesting residential hospice.  I spoke with Cheri with [facility]; they should have a bed today. I asked her to call me asap if they will not.  Marjie Skiff Jeniece Hannis, RN, BSN, [facility] Palliative Medicine Team 10/21/2020 8:48 AM Office [phone]

## 2020-10-21 NOTE — Progress Notes (Signed)
I spoke with Dr. Tamala Julian about Willie Kennedy decision to enroll in hospice and then I spoke with his sister, Willie Kennedy, by phone.  We talked about how much her brother has struggled and that she feels strongly that her brother is at peace with his decision.  I offered my emotional support to [facility] and let her know how much admired the loving care that she gave to her brother during his courageous fight with cancer.  She knows that we will cancel his appointments here at the cancer center and that we are phone call away if there is anything else we can provide for them.  She expressed deep appreciation on behalf of her brother for the care that we provided and thanked me for the call.  -----------------------------------  Eppie Gibson, MD

## 2020-10-22 ENCOUNTER — Inpatient Hospital Stay: Payer: Medicare HMO

## 2020-10-22 ENCOUNTER — Inpatient Hospital Stay: Payer: Medicare HMO | Admitting: Oncology

## 2020-10-22 LAB — TYPE AND SCREEN
ABO/RH(D): O POS
Antibody Screen: NEGATIVE
Unit division: 0
Unit division: 0
Unit division: 0
Unit division: 0
Unit division: 0
Unit division: 0
Unit division: 0
Unit division: 0
Unit division: 0
Unit division: 0
Unit division: 0
Unit division: 0
Unit division: 0
Unit division: 0
Unit division: 0
Unit division: 0
Unit division: 0

## 2020-10-22 LAB — BPAM RBC
Blood Product Expiration Date: 202112092359
Blood Product Expiration Date: 202112092359
Blood Product Expiration Date: 202112132359
Blood Product Expiration Date: 202112212359
Blood Product Expiration Date: 202112212359
Blood Product Expiration Date: 202112252359
Blood Product Expiration Date: 202112252359
Blood Product Expiration Date: 202112252359
Blood Product Expiration Date: 202112252359
Blood Product Expiration Date: 202112252359
Blood Product Expiration Date: 202112252359
Blood Product Expiration Date: 202112252359
Blood Product Expiration Date: 202112252359
Blood Product Expiration Date: 202112252359
Blood Product Expiration Date: 202112252359
Blood Product Expiration Date: 202112252359
Blood Product Expiration Date: 202112252359
ISSUE DATE / TIME: 202111210039
ISSUE DATE / TIME: 202111210101
ISSUE DATE / TIME: 202111210101
ISSUE DATE / TIME: 202111210108
ISSUE DATE / TIME: 202111210108
ISSUE DATE / TIME: 202111210135
ISSUE DATE / TIME: 202111210135
ISSUE DATE / TIME: 202111210147
ISSUE DATE / TIME: 202111210147
ISSUE DATE / TIME: 202111210200
ISSUE DATE / TIME: 202111210200
ISSUE DATE / TIME: 202111211417
Unit Type and Rh: 5100
Unit Type and Rh: 5100
Unit Type and Rh: 5100
Unit Type and Rh: 5100
Unit Type and Rh: 5100
Unit Type and Rh: 5100
Unit Type and Rh: 5100
Unit Type and Rh: 5100
Unit Type and Rh: 5100
Unit Type and Rh: 5100
Unit Type and Rh: 5100
Unit Type and Rh: 5100
Unit Type and Rh: 5100
Unit Type and Rh: 5100
Unit Type and Rh: 5100
Unit Type and Rh: 5100
Unit Type and Rh: 5100

## 2020-10-29 ENCOUNTER — Encounter: Payer: Medicare HMO | Admitting: Nutrition

## 2020-10-29 DEATH — deceased

## 2020-11-13 ENCOUNTER — Ambulatory Visit: Payer: Medicare HMO

## 2020-11-14 NOTE — Progress Notes (Signed)
  Patient Name: Willie Kennedy MRN: [id number] DOB: 1951/06/26 Referring Physician: Redmond Baseman [name] (Profile Not Attached) Date of Service: 09/17/2020 [facility] [place]                                                        End Of Treatment Note  Diagnoses: C11.8-Malignant neoplasm of overlapping sites of nasopharynx  Cancer Staging: Cancer Staging Carcinoma of posterior wall of nasopharynx ([place]) Staging form: Pharynx - Nasopharynx, AJCC 8th Edition - Clinical stage from 07/16/2020: Stage III (cT3, cN0, cM0) - Signed by Eppie Gibson, MD on 07/18/2020   Intent: Curative  Radiation Treatment Dates: 07/30/2020 through 09/17/2020 Site Technique Total Dose (Gy) Dose per Fx (Gy) Completed Fx Beam Energies  Nasopharynx: HN_NasoP IMRT 70/70 2 35/35 6X   Narrative: The patient tolerated radiation therapy with difficulty due to continued base of skull pain.  He was referred to pain management clinic for extra support, and chemotherapy was prematurely stopped.  Plan: The patient will follow-up with radiation oncology in 3 weeks.  -----------------------------------  Eppie Gibson, MD

## 2020-12-23 ENCOUNTER — Ambulatory Visit: Payer: Self-pay | Admitting: Radiation Oncology

## 2021-01-12 ENCOUNTER — Encounter: Payer: Self-pay | Admitting: Gastroenterology

## 2021-01-20 ENCOUNTER — Telehealth: Payer: Self-pay | Admitting: Gastroenterology

## 2021-01-20 NOTE — Telephone Encounter (Signed)
It looks like the pt was last seen by our practice in 2011.  Thanks

## 2022-06-21 IMAGING — CT NM PET TUM IMG INITIAL (PI) SKULL BASE T - THIGH
1 of 7 series · 2 of 25 positions shown · non-contrast
Comparison: CT neck with contrast 06/05/2019

CLINICAL DATA: Initial treatment strategy for head and neck cancer.

EXAM:
NUCLEAR MEDICINE PET SKULL BASE TO THIGH
TECHNIQUE: 6.13 mCi F-18 FDG was injected intravenously. Full-ring PET imaging
was performed from the skull base to thigh after the radiotracer. CT
data was obtained and used for attenuation correction and anatomic
localization.
Fasting blood glucose: 89 mg/dl

[Series 4: ct hn_sk_th 5.0 b31f · axial · 5.0mm · 0.98mm/px · z∈[-650,-430]mm · 2 of 218 slices shown]
[im 163/218  brain]
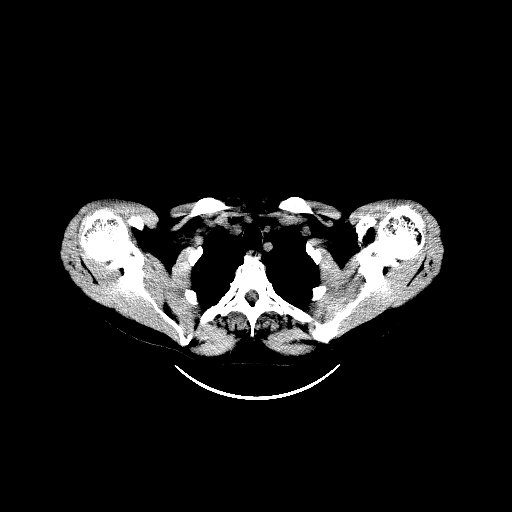
[im 218/218  brain]
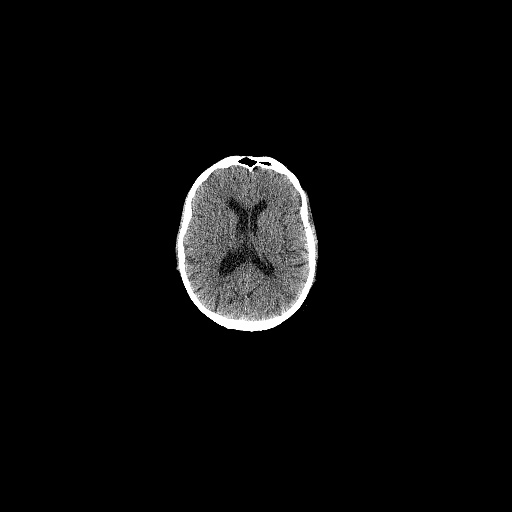

[2 of 25 positions shown; findings below may reference images not displayed]

FINDINGS: Mediastinal blood pool activity: SUV max

Liver activity: SUV max NA

NECK: There is intense FDG uptake associated with right posterior
nasopharyngeal mass within SUV max of 24.35. As noted on previous CT
the mass involves the right occipital condyle and petrous apex. No
enlarged or FDG avid cervical lymph nodes identified bilaterally.

Incidental CT findings: none

CHEST: No FDG avid supraclavicular, axillary, mediastinal, or hilar
lymph nodes. No FDG avid pulmonary nodules.

Incidental CT findings: Aortic atherosclerosis. Three vessel
coronary artery atherosclerotic calcifications. Advanced changes of
paraseptal and centrilobular emphysema. Calcified granuloma is noted
within the medial right lower lobe.

ABDOMEN/PELVIS: No abnormal hypermetabolic activity within the
liver, pancreas, adrenal glands, or spleen. No hypermetabolic lymph
nodes in the abdomen or pelvis.

Incidental CT findings: Bilateral kidney cysts. Small stone noted
within inferior pole collecting system of the left kidney, image
126/4. Aortic atherosclerosis.

SKELETON: No focal hypermetabolic activity to suggest skeletal
metastasis.

Incidental CT findings: none
IMPRESSION: 1. Intense FDG uptake is associated with the right posterolateral
nasopharyngeal mass which erodes into the right occipital condyle
and petrous apex.
2. No signs of FDG avid cervical nodal metastasis or evidence of
metastatic disease.
3. Left renal calculi.
4. Aortic Atherosclerosis (KDAGR-NM0.0) and Emphysema (KDAGR-Q0R.8).
Coronary artery calcifications.
